# Patient Record
Sex: Female | Born: 1935
Health system: Southern US, Community
[De-identification: ages and names within clinical notes are randomized; demographics above are authoritative.]

## PROBLEM LIST (undated history)

## (undated) DIAGNOSIS — I5189 Other ill-defined heart diseases: Secondary | ICD-10-CM

## (undated) DIAGNOSIS — F329 Major depressive disorder, single episode, unspecified: Secondary | ICD-10-CM

## (undated) DIAGNOSIS — I219 Acute myocardial infarction, unspecified: Secondary | ICD-10-CM

## (undated) DIAGNOSIS — K635 Polyp of colon: Secondary | ICD-10-CM

## (undated) DIAGNOSIS — F32A Depression, unspecified: Secondary | ICD-10-CM

## (undated) DIAGNOSIS — F0390 Unspecified dementia without behavioral disturbance: Secondary | ICD-10-CM

## (undated) DIAGNOSIS — E669 Obesity, unspecified: Secondary | ICD-10-CM

## (undated) DIAGNOSIS — E785 Hyperlipidemia, unspecified: Secondary | ICD-10-CM

## (undated) DIAGNOSIS — I10 Essential (primary) hypertension: Secondary | ICD-10-CM

## (undated) DIAGNOSIS — I2510 Atherosclerotic heart disease of native coronary artery without angina pectoris: Principal | ICD-10-CM

## (undated) HISTORY — PX: ABDOMINAL HYSTERECTOMY: SHX81

## (undated) HISTORY — DX: Hyperlipidemia, unspecified: E78.5

## (undated) HISTORY — PX: VESICOVAGINAL FISTULA CLOSURE W/ TAH: SUR271

## (undated) HISTORY — DX: Obesity, unspecified: E66.9

## (undated) HISTORY — DX: Major depressive disorder, single episode, unspecified: F32.9

## (undated) HISTORY — DX: Atherosclerotic heart disease of native coronary artery without angina pectoris: I25.10

## (undated) HISTORY — DX: Depression, unspecified: F32.A

## (undated) HISTORY — PX: APPENDECTOMY: SHX54

## (undated) HISTORY — DX: Essential (primary) hypertension: I10

## (undated) HISTORY — DX: Other ill-defined heart diseases: I51.89

---

## 2001-04-23 ENCOUNTER — Ambulatory Visit (HOSPITAL_COMMUNITY): Admission: RE | Admit: 2001-04-23 | Discharge: 2001-04-23 | Payer: Self-pay | Admitting: Family Medicine

## 2001-07-30 ENCOUNTER — Encounter: Payer: Self-pay | Admitting: Family Medicine

## 2001-07-30 ENCOUNTER — Ambulatory Visit (HOSPITAL_COMMUNITY): Admission: RE | Admit: 2001-07-30 | Discharge: 2001-07-30 | Payer: Self-pay | Admitting: Family Medicine

## 2001-08-18 ENCOUNTER — Encounter: Admission: RE | Admit: 2001-08-18 | Discharge: 2001-08-18 | Payer: Self-pay | Admitting: Orthopedic Surgery

## 2001-08-18 ENCOUNTER — Encounter: Payer: Self-pay | Admitting: Orthopedic Surgery

## 2001-08-20 ENCOUNTER — Ambulatory Visit (HOSPITAL_BASED_OUTPATIENT_CLINIC_OR_DEPARTMENT_OTHER): Admission: RE | Admit: 2001-08-20 | Discharge: 2001-08-20 | Payer: Self-pay | Admitting: Orthopedic Surgery

## 2001-11-25 ENCOUNTER — Encounter: Payer: Self-pay | Admitting: Internal Medicine

## 2001-11-25 ENCOUNTER — Ambulatory Visit (HOSPITAL_COMMUNITY): Admission: RE | Admit: 2001-11-25 | Discharge: 2001-11-25 | Payer: Self-pay | Admitting: Internal Medicine

## 2002-03-02 ENCOUNTER — Ambulatory Visit (HOSPITAL_COMMUNITY): Admission: RE | Admit: 2002-03-02 | Discharge: 2002-03-02 | Payer: Self-pay | Admitting: Ophthalmology

## 2002-05-25 ENCOUNTER — Ambulatory Visit (HOSPITAL_COMMUNITY): Admission: RE | Admit: 2002-05-25 | Discharge: 2002-05-25 | Payer: Self-pay | Admitting: Ophthalmology

## 2002-06-10 ENCOUNTER — Ambulatory Visit (HOSPITAL_COMMUNITY): Admission: RE | Admit: 2002-06-10 | Discharge: 2002-06-10 | Payer: Self-pay | Admitting: Family Medicine

## 2002-06-10 ENCOUNTER — Encounter: Payer: Self-pay | Admitting: Family Medicine

## 2003-04-07 ENCOUNTER — Encounter: Payer: Self-pay | Admitting: *Deleted

## 2003-04-07 ENCOUNTER — Encounter (HOSPITAL_COMMUNITY): Admission: RE | Admit: 2003-04-07 | Discharge: 2003-05-07 | Payer: Self-pay | Admitting: *Deleted

## 2003-06-23 ENCOUNTER — Encounter: Payer: Self-pay | Admitting: Family Medicine

## 2003-06-23 ENCOUNTER — Ambulatory Visit (HOSPITAL_COMMUNITY): Admission: RE | Admit: 2003-06-23 | Discharge: 2003-06-23 | Payer: Self-pay | Admitting: Family Medicine

## 2004-06-26 ENCOUNTER — Ambulatory Visit (HOSPITAL_COMMUNITY): Admission: RE | Admit: 2004-06-26 | Discharge: 2004-06-26 | Payer: Self-pay | Admitting: Family Medicine

## 2004-08-04 DIAGNOSIS — I2510 Atherosclerotic heart disease of native coronary artery without angina pectoris: Secondary | ICD-10-CM

## 2004-08-04 DIAGNOSIS — I219 Acute myocardial infarction, unspecified: Secondary | ICD-10-CM

## 2004-08-04 HISTORY — DX: Acute myocardial infarction, unspecified: I21.9

## 2004-08-04 HISTORY — DX: Atherosclerotic heart disease of native coronary artery without angina pectoris: I25.10

## 2004-08-07 ENCOUNTER — Inpatient Hospital Stay (HOSPITAL_BASED_OUTPATIENT_CLINIC_OR_DEPARTMENT_OTHER): Admission: RE | Admit: 2004-08-07 | Discharge: 2004-08-07 | Payer: Self-pay | Admitting: *Deleted

## 2004-09-11 ENCOUNTER — Ambulatory Visit: Payer: Self-pay | Admitting: Family Medicine

## 2004-11-06 ENCOUNTER — Ambulatory Visit: Payer: Self-pay | Admitting: Family Medicine

## 2004-11-13 ENCOUNTER — Ambulatory Visit: Payer: Self-pay | Admitting: *Deleted

## 2005-03-06 ENCOUNTER — Ambulatory Visit: Payer: Self-pay | Admitting: Family Medicine

## 2005-03-21 ENCOUNTER — Ambulatory Visit: Payer: Self-pay | Admitting: *Deleted

## 2005-04-24 ENCOUNTER — Ambulatory Visit: Payer: Self-pay | Admitting: Family Medicine

## 2005-05-16 ENCOUNTER — Ambulatory Visit (HOSPITAL_COMMUNITY): Admission: RE | Admit: 2005-05-16 | Discharge: 2005-05-16 | Payer: Self-pay | Admitting: General Surgery

## 2005-09-12 ENCOUNTER — Ambulatory Visit: Payer: Self-pay | Admitting: Family Medicine

## 2005-09-17 ENCOUNTER — Ambulatory Visit (HOSPITAL_COMMUNITY): Admission: RE | Admit: 2005-09-17 | Discharge: 2005-09-17 | Payer: Self-pay | Admitting: Family Medicine

## 2005-12-16 ENCOUNTER — Ambulatory Visit: Payer: Self-pay | Admitting: Family Medicine

## 2006-04-02 ENCOUNTER — Ambulatory Visit: Payer: Self-pay | Admitting: Family Medicine

## 2006-06-23 ENCOUNTER — Ambulatory Visit: Payer: Self-pay | Admitting: Family Medicine

## 2006-08-27 ENCOUNTER — Ambulatory Visit: Payer: Self-pay | Admitting: Family Medicine

## 2006-09-18 ENCOUNTER — Ambulatory Visit (HOSPITAL_COMMUNITY): Admission: RE | Admit: 2006-09-18 | Discharge: 2006-09-18 | Payer: Self-pay | Admitting: Family Medicine

## 2006-12-19 ENCOUNTER — Ambulatory Visit: Payer: Self-pay | Admitting: Family Medicine

## 2006-12-19 LAB — CONVERTED CEMR LAB
Basophils Absolute: 0 10*3/uL (ref 0.0–0.1)
Hemoglobin: 11.6 g/dL — ABNORMAL LOW (ref 12.0–15.0)
Hgb A1c MFr Bld: 6.2 % — ABNORMAL HIGH (ref 4.6–6.1)
Lymphocytes Relative: 25 % (ref 12–46)
Microalb, Ur: 1.12 mg/dL (ref 0.00–1.89)
Monocytes Absolute: 0.7 10*3/uL (ref 0.2–0.7)
Neutro Abs: 6.2 10*3/uL (ref 1.7–7.7)
Neutrophils Relative %: 65 % (ref 43–77)
Platelets: 301 10*3/uL (ref 150–400)
RDW: 17 % — ABNORMAL HIGH (ref 11.5–14.0)

## 2006-12-24 ENCOUNTER — Ambulatory Visit (HOSPITAL_COMMUNITY): Admission: RE | Admit: 2006-12-24 | Discharge: 2006-12-24 | Payer: Self-pay | Admitting: Family Medicine

## 2007-01-07 ENCOUNTER — Ambulatory Visit (HOSPITAL_COMMUNITY): Admission: RE | Admit: 2007-01-07 | Discharge: 2007-01-07 | Payer: Self-pay | Admitting: Family Medicine

## 2007-01-21 ENCOUNTER — Encounter (INDEPENDENT_AMBULATORY_CARE_PROVIDER_SITE_OTHER): Payer: Self-pay | Admitting: *Deleted

## 2007-01-21 LAB — CONVERTED CEMR LAB

## 2007-03-19 ENCOUNTER — Ambulatory Visit: Payer: Self-pay | Admitting: Family Medicine

## 2007-03-19 LAB — CONVERTED CEMR LAB
ALT: 8 units/L (ref 0–35)
AST: 13 units/L (ref 0–37)
Calcium: 9.4 mg/dL (ref 8.4–10.5)
Cholesterol: 126 mg/dL (ref 0–200)
Hgb A1c MFr Bld: 6.4 % — ABNORMAL HIGH (ref 4.6–6.1)
Potassium: 4.3 meq/L (ref 3.5–5.3)
Sodium: 142 meq/L (ref 135–145)
Total CHOL/HDL Ratio: 2.8
Total Protein: 7.1 g/dL (ref 6.0–8.3)
Triglycerides: 70 mg/dL (ref <150)
VLDL: 14 mg/dL (ref 0–40)

## 2007-04-04 ENCOUNTER — Emergency Department (HOSPITAL_COMMUNITY): Admission: EM | Admit: 2007-04-04 | Discharge: 2007-04-04 | Payer: Self-pay | Admitting: Physician Assistant

## 2007-04-29 ENCOUNTER — Ambulatory Visit (HOSPITAL_COMMUNITY): Admission: RE | Admit: 2007-04-29 | Discharge: 2007-04-29 | Payer: Self-pay | Admitting: Family Medicine

## 2007-04-29 ENCOUNTER — Ambulatory Visit: Payer: Self-pay | Admitting: Family Medicine

## 2007-07-07 ENCOUNTER — Ambulatory Visit: Payer: Self-pay | Admitting: Family Medicine

## 2007-07-09 ENCOUNTER — Ambulatory Visit (HOSPITAL_COMMUNITY): Admission: RE | Admit: 2007-07-09 | Discharge: 2007-07-09 | Payer: Self-pay | Admitting: Family Medicine

## 2007-07-13 ENCOUNTER — Ambulatory Visit: Payer: Self-pay | Admitting: Internal Medicine

## 2007-08-04 ENCOUNTER — Ambulatory Visit: Payer: Self-pay | Admitting: Family Medicine

## 2007-10-06 ENCOUNTER — Ambulatory Visit: Payer: Self-pay | Admitting: Family Medicine

## 2007-10-12 ENCOUNTER — Encounter: Payer: Self-pay | Admitting: Family Medicine

## 2007-10-12 ENCOUNTER — Ambulatory Visit (HOSPITAL_COMMUNITY): Admission: RE | Admit: 2007-10-12 | Discharge: 2007-10-12 | Payer: Self-pay | Admitting: Family Medicine

## 2007-10-12 LAB — CONVERTED CEMR LAB
Albumin: 4.1 g/dL (ref 3.5–5.2)
BUN: 17 mg/dL (ref 6–23)
CO2: 27 meq/L (ref 19–32)
Chloride: 104 meq/L (ref 96–112)
Indirect Bilirubin: 0.4 mg/dL (ref 0.0–0.9)
LDL Cholesterol: 89 mg/dL (ref 0–99)
Potassium: 4.2 meq/L (ref 3.5–5.3)
Sodium: 143 meq/L (ref 135–145)
Total Protein: 7.2 g/dL (ref 6.0–8.3)
Triglycerides: 90 mg/dL (ref <150)
VLDL: 18 mg/dL (ref 0–40)

## 2007-11-05 ENCOUNTER — Encounter: Payer: Self-pay | Admitting: Family Medicine

## 2007-12-22 ENCOUNTER — Ambulatory Visit: Payer: Self-pay | Admitting: Family Medicine

## 2008-01-12 ENCOUNTER — Ambulatory Visit: Payer: Self-pay | Admitting: Family Medicine

## 2008-01-13 ENCOUNTER — Encounter: Payer: Self-pay | Admitting: Family Medicine

## 2008-01-26 ENCOUNTER — Encounter: Payer: Self-pay | Admitting: Family Medicine

## 2008-01-26 LAB — CONVERTED CEMR LAB
ALT: 10 units/L (ref 0–35)
AST: 14 units/L (ref 0–37)
Albumin: 4 g/dL (ref 3.5–5.2)
Alkaline Phosphatase: 60 units/L (ref 39–117)
BUN: 23 mg/dL (ref 6–23)
Basophils Absolute: 0 10*3/uL (ref 0.0–0.1)
Basophils Relative: 0 % (ref 0–1)
Bilirubin, Direct: 0.1 mg/dL (ref 0.0–0.3)
CO2: 24 meq/L (ref 19–32)
Calcium: 9.3 mg/dL (ref 8.4–10.5)
Chloride: 104 meq/L (ref 96–112)
Cholesterol: 133 mg/dL (ref 0–200)
Creatinine, Ser: 0.92 mg/dL (ref 0.40–1.20)
Eosinophils Absolute: 0.4 10*3/uL (ref 0.0–0.7)
Eosinophils Relative: 4 % (ref 0–5)
Glucose, Bld: 94 mg/dL (ref 70–99)
HCT: 36.4 % (ref 36.0–46.0)
HDL: 41 mg/dL (ref >39)
Hemoglobin: 11.5 g/dL — ABNORMAL LOW (ref 12.0–15.0)
Indirect Bilirubin: 0.3 mg/dL (ref 0.0–0.9)
LDL Cholesterol: 77 mg/dL (ref 0–99)
Lymphocytes Relative: 21 % (ref 12–46)
Lymphs Abs: 2 10*3/uL (ref 0.7–4.0)
MCHC: 31.6 g/dL (ref 30.0–36.0)
MCV: 81.6 fL (ref 78.0–100.0)
Monocytes Absolute: 0.8 10*3/uL (ref 0.1–1.0)
Monocytes Relative: 8 % (ref 3–12)
Neutro Abs: 6.3 10*3/uL (ref 1.7–7.7)
Neutrophils Relative %: 66 % (ref 43–77)
Platelets: 238 10*3/uL (ref 150–400)
Potassium: 4.4 meq/L (ref 3.5–5.3)
RBC: 4.46 M/uL (ref 3.87–5.11)
RDW: 16.9 % — ABNORMAL HIGH (ref 11.5–15.5)
Sodium: 141 meq/L (ref 135–145)
TSH: 0.876 microintl units/mL (ref 0.350–5.50)
Total Bilirubin: 0.4 mg/dL (ref 0.3–1.2)
Total CHOL/HDL Ratio: 3.2
Total Protein: 6.9 g/dL (ref 6.0–8.3)
Triglycerides: 74 mg/dL (ref <150)
VLDL: 15 mg/dL (ref 0–40)
WBC: 9.6 10*3/uL (ref 4.0–10.5)

## 2008-02-02 ENCOUNTER — Ambulatory Visit: Payer: Self-pay | Admitting: Family Medicine

## 2008-02-10 ENCOUNTER — Encounter (INDEPENDENT_AMBULATORY_CARE_PROVIDER_SITE_OTHER): Payer: Self-pay | Admitting: *Deleted

## 2008-02-10 DIAGNOSIS — R7303 Prediabetes: Secondary | ICD-10-CM

## 2008-02-10 DIAGNOSIS — I10 Essential (primary) hypertension: Secondary | ICD-10-CM

## 2008-02-10 DIAGNOSIS — F329 Major depressive disorder, single episode, unspecified: Secondary | ICD-10-CM | POA: Insufficient documentation

## 2008-02-10 DIAGNOSIS — E785 Hyperlipidemia, unspecified: Secondary | ICD-10-CM

## 2008-02-10 DIAGNOSIS — E669 Obesity, unspecified: Secondary | ICD-10-CM

## 2008-04-20 ENCOUNTER — Ambulatory Visit: Payer: Self-pay | Admitting: Family Medicine

## 2008-05-17 ENCOUNTER — Ambulatory Visit: Payer: Self-pay | Admitting: Family Medicine

## 2008-06-29 ENCOUNTER — Encounter: Payer: Self-pay | Admitting: Family Medicine

## 2008-06-30 ENCOUNTER — Encounter: Payer: Self-pay | Admitting: Family Medicine

## 2008-08-02 ENCOUNTER — Ambulatory Visit: Payer: Self-pay | Admitting: Family Medicine

## 2008-08-02 LAB — CONVERTED CEMR LAB
ALT: 10 units/L (ref 0–35)
AST: 16 units/L (ref 0–37)
BUN: 22 mg/dL (ref 6–23)
Calcium: 9.8 mg/dL (ref 8.4–10.5)
Cholesterol: 151 mg/dL (ref 0–200)
Hgb A1c MFr Bld: 5.7 %
Indirect Bilirubin: 0.4 mg/dL (ref 0.0–0.9)
Potassium: 4.4 meq/L (ref 3.5–5.3)
Sodium: 141 meq/L (ref 135–145)
Total Protein: 7.5 g/dL (ref 6.0–8.3)
Triglycerides: 75 mg/dL (ref <150)
VLDL: 15 mg/dL (ref 0–40)

## 2008-09-22 ENCOUNTER — Encounter: Payer: Self-pay | Admitting: Family Medicine

## 2008-09-27 ENCOUNTER — Encounter: Payer: Self-pay | Admitting: Family Medicine

## 2008-09-28 ENCOUNTER — Encounter: Payer: Self-pay | Admitting: Family Medicine

## 2008-10-17 ENCOUNTER — Encounter: Payer: Self-pay | Admitting: Family Medicine

## 2008-10-31 ENCOUNTER — Encounter: Payer: Self-pay | Admitting: Family Medicine

## 2008-11-15 ENCOUNTER — Ambulatory Visit: Payer: Self-pay | Admitting: Family Medicine

## 2008-11-15 LAB — CONVERTED CEMR LAB
AST: 14 units/L (ref 0–37)
Alkaline Phosphatase: 60 units/L (ref 39–117)
BUN: 24 mg/dL — ABNORMAL HIGH (ref 6–23)
Bilirubin, Direct: 0.1 mg/dL (ref 0.0–0.3)
CO2: 25 meq/L (ref 19–32)
Calcium: 9.3 mg/dL (ref 8.4–10.5)
Creatinine, Ser: 0.8 mg/dL (ref 0.40–1.20)
Glucose, Bld: 121 mg/dL
Glucose, Bld: 94 mg/dL (ref 70–99)
HDL: 45 mg/dL (ref >39)
Indirect Bilirubin: 0.4 mg/dL (ref 0.0–0.9)
LDL Cholesterol: 88 mg/dL (ref 0–99)
Total Bilirubin: 0.5 mg/dL (ref 0.3–1.2)

## 2008-11-23 ENCOUNTER — Ambulatory Visit (HOSPITAL_COMMUNITY): Admission: RE | Admit: 2008-11-23 | Discharge: 2008-11-23 | Payer: Self-pay | Admitting: Family Medicine

## 2008-12-05 ENCOUNTER — Encounter: Payer: Self-pay | Admitting: Family Medicine

## 2009-02-23 ENCOUNTER — Ambulatory Visit: Payer: Self-pay | Admitting: Family Medicine

## 2009-05-02 ENCOUNTER — Encounter: Payer: Self-pay | Admitting: Family Medicine

## 2009-05-22 ENCOUNTER — Encounter: Payer: Self-pay | Admitting: Family Medicine

## 2009-05-29 ENCOUNTER — Encounter: Payer: Self-pay | Admitting: Family Medicine

## 2009-05-29 LAB — CONVERTED CEMR LAB
Basophils Relative: 0 % (ref 0–1)
CO2: 25 meq/L (ref 19–32)
Calcium: 9.2 mg/dL (ref 8.4–10.5)
Chloride: 104 meq/L (ref 96–112)
Creatinine, Ser: 0.82 mg/dL (ref 0.40–1.20)
Eosinophils Absolute: 0.4 10*3/uL (ref 0.0–0.7)
Glucose, Bld: 81 mg/dL (ref 70–99)
HCT: 37.8 % (ref 36.0–46.0)
HDL: 45 mg/dL (ref >39)
Hemoglobin: 12 g/dL (ref 12.0–15.0)
LDL Cholesterol: 89 mg/dL (ref 0–99)
Lymphs Abs: 1.9 10*3/uL (ref 0.7–4.0)
MCHC: 31.7 g/dL (ref 30.0–36.0)
MCV: 82.4 fL (ref 78.0–100.0)
Monocytes Absolute: 0.8 10*3/uL (ref 0.1–1.0)
Monocytes Relative: 9 % (ref 3–12)
RBC: 4.59 M/uL (ref 3.87–5.11)
Total CHOL/HDL Ratio: 3.3
WBC: 8.4 10*3/uL (ref 4.0–10.5)

## 2009-06-01 ENCOUNTER — Ambulatory Visit: Payer: Self-pay | Admitting: Family Medicine

## 2009-06-01 LAB — CONVERTED CEMR LAB
Glucose, Bld: 95 mg/dL
Hgb A1c MFr Bld: 5.9 %

## 2009-06-02 ENCOUNTER — Encounter: Payer: Self-pay | Admitting: Family Medicine

## 2009-06-02 LAB — CONVERTED CEMR LAB
Creatinine, Urine: 193.8 mg/dL
Microalb Creat Ratio: 13.2 mg/g (ref 0.0–30.0)

## 2009-06-12 ENCOUNTER — Telehealth: Payer: Self-pay | Admitting: Family Medicine

## 2009-06-13 ENCOUNTER — Ambulatory Visit (HOSPITAL_COMMUNITY): Admission: RE | Admit: 2009-06-13 | Discharge: 2009-06-13 | Payer: Self-pay | Admitting: Family Medicine

## 2009-09-06 ENCOUNTER — Ambulatory Visit: Payer: Self-pay | Admitting: Family Medicine

## 2009-09-07 LAB — CONVERTED CEMR LAB
BUN: 22 mg/dL (ref 6–23)
Bilirubin, Direct: 0.1 mg/dL (ref 0.0–0.3)
CO2: 28 meq/L (ref 19–32)
Cholesterol: 191 mg/dL (ref 0–200)
Glucose, Bld: 103 mg/dL — ABNORMAL HIGH (ref 70–99)
Indirect Bilirubin: 0.3 mg/dL (ref 0.0–0.9)
LDL Cholesterol: 124 mg/dL — ABNORMAL HIGH (ref 0–99)
Potassium: 4.6 meq/L (ref 3.5–5.3)
Sodium: 141 meq/L (ref 135–145)
TSH: 1.141 microintl units/mL (ref 0.350–4.500)
Total Bilirubin: 0.4 mg/dL (ref 0.3–1.2)
Total CHOL/HDL Ratio: 4.1
VLDL: 20 mg/dL (ref 0–40)

## 2010-01-05 ENCOUNTER — Encounter: Payer: Self-pay | Admitting: Family Medicine

## 2010-01-09 ENCOUNTER — Ambulatory Visit: Payer: Self-pay | Admitting: Family Medicine

## 2010-01-09 LAB — CONVERTED CEMR LAB: Blood Glucose, Fasting: 126 mg/dL

## 2010-01-10 LAB — CONVERTED CEMR LAB
Albumin: 3.8 g/dL (ref 3.5–5.2)
Alkaline Phosphatase: 66 units/L (ref 39–117)
CO2: 26 meq/L (ref 19–32)
Chloride: 105 meq/L (ref 96–112)
Creatinine, Ser: 0.83 mg/dL (ref 0.40–1.20)
HDL: 47 mg/dL (ref >39)
Hgb A1c MFr Bld: 6.1 % (ref 4.6–6.1)
LDL Cholesterol: 114 mg/dL — ABNORMAL HIGH (ref 0–99)
Potassium: 4.3 meq/L (ref 3.5–5.3)
Sodium: 142 meq/L (ref 135–145)
Total Bilirubin: 0.3 mg/dL (ref 0.3–1.2)
Total CHOL/HDL Ratio: 3.7
Total Protein: 6.7 g/dL (ref 6.0–8.3)
Triglycerides: 73 mg/dL (ref <150)
VLDL: 15 mg/dL (ref 0–40)

## 2010-01-14 DIAGNOSIS — B369 Superficial mycosis, unspecified: Secondary | ICD-10-CM | POA: Insufficient documentation

## 2010-02-16 ENCOUNTER — Encounter: Payer: Self-pay | Admitting: Family Medicine

## 2010-04-25 ENCOUNTER — Ambulatory Visit: Payer: Self-pay | Admitting: Family Medicine

## 2010-04-25 DIAGNOSIS — R5381 Other malaise: Secondary | ICD-10-CM | POA: Insufficient documentation

## 2010-04-25 DIAGNOSIS — E559 Vitamin D deficiency, unspecified: Secondary | ICD-10-CM

## 2010-04-25 DIAGNOSIS — R5383 Other fatigue: Secondary | ICD-10-CM

## 2010-05-03 ENCOUNTER — Ambulatory Visit (HOSPITAL_COMMUNITY): Admission: RE | Admit: 2010-05-03 | Discharge: 2010-05-03 | Payer: Self-pay | Admitting: Family Medicine

## 2010-08-07 ENCOUNTER — Ambulatory Visit: Payer: Self-pay | Admitting: Family Medicine

## 2010-08-08 ENCOUNTER — Encounter: Payer: Self-pay | Admitting: Family Medicine

## 2010-08-08 LAB — CONVERTED CEMR LAB: Creatinine, Urine: 168.4 mg/dL

## 2010-08-10 DIAGNOSIS — F0390 Unspecified dementia without behavioral disturbance: Secondary | ICD-10-CM | POA: Insufficient documentation

## 2010-09-19 ENCOUNTER — Telehealth (INDEPENDENT_AMBULATORY_CARE_PROVIDER_SITE_OTHER): Payer: Self-pay | Admitting: *Deleted

## 2010-09-19 ENCOUNTER — Ambulatory Visit: Payer: Self-pay | Admitting: Family Medicine

## 2010-09-19 DIAGNOSIS — G5600 Carpal tunnel syndrome, unspecified upper limb: Secondary | ICD-10-CM

## 2010-09-20 ENCOUNTER — Encounter: Payer: Self-pay | Admitting: Family Medicine

## 2010-09-20 LAB — CONVERTED CEMR LAB
AST: 13 units/L (ref 0–37)
Alkaline Phosphatase: 75 units/L (ref 39–117)
BUN: 18 mg/dL (ref 6–23)
Calcium: 9.4 mg/dL (ref 8.4–10.5)
Chloride: 100 meq/L (ref 96–112)
Creatinine, Ser: 0.82 mg/dL (ref 0.40–1.20)
Creatinine, Urine: 133.4 mg/dL
Indirect Bilirubin: 0.4 mg/dL (ref 0.0–0.9)
LDL Cholesterol: 133 mg/dL — ABNORMAL HIGH (ref 0–99)
Microalb Creat Ratio: 10.3 mg/g (ref 0.0–30.0)
Total Bilirubin: 0.5 mg/dL (ref 0.3–1.2)
Total Protein: 7 g/dL (ref 6.0–8.3)
Triglycerides: 95 mg/dL (ref <150)

## 2010-11-25 ENCOUNTER — Encounter: Payer: Self-pay | Admitting: Family Medicine

## 2010-12-04 NOTE — Letter (Signed)
Summary: MEDICAL RELEASE  MEDICAL RELEASE   Imported By: Lind Guest 01/12/2010 13:49:55  _____________________________________________________________________  External Attachment:    Type:   Image     Comment:   External Document

## 2010-12-04 NOTE — Letter (Signed)
Summary: OFFICE NOTES  OFFICE NOTES   Imported By: Lind Guest 04/17/2010 08:49:14  _____________________________________________________________________  External Attachment:    Type:   Image     Comment:   External Document

## 2010-12-04 NOTE — Letter (Signed)
Summary: medical release  medical release   Imported By: Lind Guest 02/16/2010 13:14:29  _____________________________________________________________________  External Attachment:    Type:   Image     Comment:   External Document

## 2010-12-04 NOTE — Assessment & Plan Note (Signed)
Summary: OV   Vital Signs:  Patient profile:   77 year old female Menstrual status:  hysterectomy Height:      61 inches Weight:      212.25 pounds BMI:     40.25 O2 Sat:      95 % on Room air Pulse rate:   95 / minute Pulse rhythm:   regular Resp:     16 per minute BP sitting:   130 / 80  (left arm)  Vitals Entered By: Mauricia Area CMA (September 19, 2010 1:48 PM)  Nutrition Counseling: Patient's BMI is greater than 25 and therefore counseled on weight management options.  O2 Flow:  Room air CC: Both hands numb for past month Comments Did not bring meds   Primary Care Provider:  dr Lodema Hong  CC:  Both hands numb for past month.  History of Present Illness: Reports  that she has been doing fairly well She is concerned about bilateral weakness in the hands and numbness. Denies recent fever or chills. Denies sinus pressure, nasal congestion , ear pain or sore throat. Denies chest congestion, or cough productive of sputum. Denies chest pain, palpitations, PND, orthopnea or leg swelling. Denies abdominal pain, nausea, vomitting, diarrhea or constipation. Denies change in bowel movements or bloody stool. Denies dysuria , frequency, incontinence or hesitancy. Denies  joint pain, swelling, or reduced mobility. Denies headaches, vertigo, seizures.  Denies  rash, lesions, or itch.     Allergies (verified): No Known Drug Allergies  Review of Systems      See HPI General:  Complains of fatigue. Eyes:  Complains of vision loss-both eyes; denies eye pain and red eye. MS:  Complains of joint pain, low back pain, mid back pain, muscle aches, muscle weakness, and stiffness. Neuro:  Complains of memory loss. Psych:  Complains of anxiety and depression; denies mental problems, suicidal thoughts/plans, thoughts of violence, and unusual visions or sounds. Endo:  Denies cold intolerance, excessive hunger, excessive thirst, excessive urination, and heat intolerance. Heme:  Denies  abnormal bruising and bleeding. Allergy:  Complains of seasonal allergies.  Physical Exam  General:  Well-developed,obese,in no acute distress; alert,appropriate and cooperative throughout examination HEENT: No facial asymmetry,  EOMI, No sinus tenderness, TM's Clear, oropharynx  pink and moist.   Chest: Clear to auscultation bilaterally.  CVS: S1, S2, No murmurs, No S3.   Abd: Soft, Nontender.  ZO:[id number]  ROM spine, hips, shoulders and knees. positive tinnels with bilateal thenar wasting Ext: No edema.   CNS: CN 2-12 intact, reduced power in hands  Skin: Intact, no visible lesions or rashes.  Psych: Good eye contact, normal affect.  Memory loss not anxious or depressed appearing.   Diabetes Management Exam:    Foot Exam (with socks and/or shoes not present):       Sensory-Monofilament:          Left foot: diminished          Right foot: diminished       Inspection:          Left foot: abnormal             Comments: callous          Right foot: abnormal             Comments: callous       Nails:          Left foot: thickened          Right foot: thickened   Impression &  Recommendations:  Problem # 1:  CARPAL TUNNEL SYNDROME, BILATERAL (ICD-354.0) Assessment Deteriorated  Orders: Depo- Medrol 80mg  (J1040) Ketorolac-Toradol 15mg  (D3220) Admin of Therapeutic Inj  intramuscular or subcutaneous (25427) Medicare Electronic Prescription (C6237)  Problem # 2:  DIABETES MELLITUS (ICD-250.00) Assessment: Comment Only  Her updated medication list for this problem includes:    Diovan Hct 320-25 Mg Tabs (Valsartan-hydrochlorothiazide) ..... One tab by mouth once daily  Labs Reviewed: Creat: 0.83 (01/09/2010)    Reviewed HgBA1c results: 6.1 (01/09/2010)  5.8 (09/06/2009)  Problem # 3:  HYPERLIPIDEMIA (ICD-272.4) Assessment: Comment Only  Labs Reviewed: SGOT: 12 (01/09/2010)   SGPT: <8 U/L (01/09/2010)   HDL:47 (01/09/2010), 47 (09/06/2009)  LDL:114 (01/09/2010), 124  (09/06/2009)  Chol:176 (01/09/2010), 191 (09/06/2009)  Trig:73 (01/09/2010), 98 (09/06/2009) Low fat dietdiscussed and encouraged  Problem # 4:  HYPERTENSION (ICD-401.9) Assessment: Unchanged  Her updated medication list for this problem includes:    Diovan Hct 320-25 Mg Tabs (Valsartan-hydrochlorothiazide) ..... One tab by mouth once daily    Metoprolol Tartrate 50 Mg Tabs (Metoprolol tartrate) ..... One half tab by mouth once daily  BP today: 130/80 Prior BP: 140/70 (08/07/2010)  Labs Reviewed: K+: 4.3 (01/09/2010) Creat: : 0.83 (01/09/2010)   Chol: 176 (01/09/2010)   HDL: 47 (01/09/2010)   LDL: 114 (01/09/2010)   TG: 73 (01/09/2010)  Complete Medication List: 1)  Xanax 0.5 Mg Tabs (Alprazolam) .... One tab by mouth once daily 2)  Diovan Hct 320-25 Mg Tabs (Valsartan-hydrochlorothiazide) .... One tab by mouth once daily 3)  Vitamin B-6 100 Mg Tabs (Pyridoxine hcl) .... One tab by mouth once daily 4)  Isosorbide Mononitrate Cr 30 Mg Xr24h-tab (Isosorbide mononitrate) .... Take 1 tablet by mouth once a day 5)  Metoprolol Tartrate 50 Mg Tabs (Metoprolol tartrate) .... One half tab by mouth once daily 6)  Fluoxetine Hcl 20 Mg Caps (Fluoxetine hcl) .... One cap by mouth qd 7)  Exelon 9.5 Mg/24hr Pt24 (Rivastigmine) .... Apply one patch daily 8)  Celebrex 200 Mg Caps (Celecoxib) .... Take 1 tablet by mouth once a day  Other Orders: T-Urine Microalbumin w/creat. ratio [id number])  Patient Instructions: 1)  Please schedule a follow-up appointment in 4 months. 2)  You need to lose weight. Consider a lower calorie diet and regular exercise.  3)  Microalb today. 4)  Labs today. 5)  Injections today for numb hands. 6)  Med is being sent in for your hands also Prescriptions: CELEBREX 200 MG CAPS (CELECOXIB) Take 1 tablet by mouth once a day  #10 x 0   Entered and Authorized by:   Syliva Overman MD   Signed by:   Syliva Overman MD on 09/19/2010   Method used:   Electronically  to        [facility]* (retail)       [address]       [place], [place]  [address]       Ph: [phone]       Fax: [phone]   RxID:   [id number] PREDNISONE (PAK) 5 MG TABS (PREDNISONE) Use as directed  #21 x 0   Entered and Authorized by:   Syliva Overman MD   Signed by:   Syliva Overman MD on 09/19/2010   Method used:   Electronically to        [facility] (retail)       [address]  [place], [place]  [address]       Ph: [phone]       Fax: [phone]   RxID:   [id number]    Medication Administration  Injection # 1:    Medication: Depo- Medrol 80mg     Diagnosis: CARPAL TUNNEL SYNDROME, BILATERAL (ICD-354.0)    Route: IM    Site: RUOQ gluteus    Exp Date: 07/12    Lot #: [id number]    Mfr: Pharmacia    Patient tolerated injection without complications    Given by: Adella Hare LPN (September 19, 2010 2:46 PM)  Injection # 2:    Medication: Ketorolac-Toradol 15mg     Diagnosis: CARPAL TUNNEL SYNDROME, BILATERAL (ICD-354.0)    Route: IM    Site: LUOQ gluteus    Exp Date: 09/05/2011    Lot #: [id number]    Mfr: [email or web address]    Comments: toradol 60mg  given    Patient tolerated injection without complications    Given by: Adella Hare LPN (September 19, 2010 2:47 PM)  Orders Added: 1)  Est. Patient Level IV [01027] 2)  T-Urine Microalbumin w/creat. ratio [[id number]] 3)  Depo- Medrol 80mg  [J1040] 4)  Ketorolac-Toradol 15mg  [J1885] 5)  Admin of Therapeutic Inj  intramuscular or subcutaneous [96372] 6)  Medicare Electronic Prescription [G8553]     Medication Administration  Injection # 1:    Medication: Depo- Medrol 80mg     Diagnosis: CARPAL TUNNEL SYNDROME, BILATERAL (ICD-354.0)    Route: IM    Site: RUOQ gluteus    Exp Date: 07/12    Lot #: [id number]    Mfr: Pharmacia    Patient tolerated injection without complications    Given by: Adella Hare LPN (September 19, 2010 2:46 PM)  Injection # 2:    Medication: Ketorolac-Toradol 15mg     Diagnosis: CARPAL TUNNEL SYNDROME, BILATERAL (ICD-354.0)    Route: IM    Site: LUOQ gluteus    Exp Date: 09/05/2011    Lot #: [id number]    Mfr: [email or web address]    Comments: toradol 60mg  given    Patient tolerated injection without complications    Given by: Adella Hare LPN (September 19, 2010 2:47 PM)  Orders Added: 1)  Est. Patient Level IV [03474] 2)  T-Urine Microalbumin w/creat. ratio [[id number]] 3)  Depo- Medrol 80mg  [J1040] 4)  Ketorolac-Toradol 15mg  [J1885] 5)  Admin of Therapeutic Inj  intramuscular or subcutaneous [96372] 6)  Medicare Electronic Prescription [id number]

## 2010-12-04 NOTE — Letter (Signed)
Summary: LABS  LABS   Imported By: Lind Guest 04/17/2010 08:47:54  _____________________________________________________________________  External Attachment:    Type:   Image     Comment:   External Document

## 2010-12-04 NOTE — Letter (Signed)
Summary: PHONE NOTES  PHONE NOTES   Imported By: Lind Guest 04/17/2010 08:49:56  _____________________________________________________________________  External Attachment:    Type:   Image     Comment:   External Document

## 2010-12-04 NOTE — Letter (Signed)
Summary: MISC  MISC   Imported By: Lind Guest 04/17/2010 08:48:35  _____________________________________________________________________  External Attachment:    Type:   Image     Comment:   External Document

## 2010-12-04 NOTE — Letter (Signed)
Summary: HISTORY AND PHYSICAL  HISTORY AND PHYSICAL   Imported By: Lind Guest 04/17/2010 08:47:22  _____________________________________________________________________  External Attachment:    Type:   Image     Comment:   External Document

## 2010-12-04 NOTE — Assessment & Plan Note (Signed)
Summary: follow up   Vital Signs:  Patient profile:   77 year old female Menstrual status:  hysterectomy Height:      61 inches Weight:      209.75 pounds BMI:     39.78 O2 Sat:      94 % on Room air Pulse rate:   71 / minute Pulse rhythm:   regular Resp:     16 per minute BP sitting:   140 / 70  (left arm)  Vitals Entered By: Adella Hare LPN (August 07, 2010 11:31 AM)  Nutrition Counseling: Patient's BMI is greater than 25 and therefore counseled on weight management options.  O2 Flow:  Room air CC: follow-up visit Is Patient Diabetic? Yes Pain Assessment Patient in pain? no        Primary Care Kyreese Chio:  dr Lodema Hong  CC:  follow-up visit.  History of Present Illness: Reports  that tshe has been doing fairly well. Denies recent fever or chills. Denies sinus pressure, nasal congestion , ear pain or sore throat. Denies chest congestion, or cough productive of sputum. Denies chest pain, palpitations, PND, orthopnea or leg swelling. Denies abdominal pain, nausea, vomitting, diarrhea or constipation. Denies change in bowel movements or bloody stool. Denies dysuria , frequency, incontinence or hesitancy. she does have chronic back pain with reduced mobility. Denies headaches, vertigo, seizures. Denies depression, anxiety or insomnia.she is on medication which controls her symptoms. Denies  rash, lesions, or itch.     Current Medications (verified): 1)  Xanax 0.5 Mg  Tabs (Alprazolam) .... One Tab By Mouth Once Daily 2)  Diovan Hct 320-25 Mg  Tabs (Valsartan-Hydrochlorothiazide) .... One Tab By Mouth Once Daily 3)  Vitamin B-6 100 Mg  Tabs (Pyridoxine Hcl) .... One Tab By Mouth Once Daily 4)  Isosorbide Mononitrate Cr 30 Mg Xr24h-Tab (Isosorbide Mononitrate) .... Take 1 Tablet By Mouth Once A Day 5)  Metoprolol Tartrate 50 Mg  Tabs (Metoprolol Tartrate) .... One Half Tab By Mouth Once Daily 6)  Fluoxetine Hcl 20 Mg Caps (Fluoxetine Hcl) .... One Cap By Mouth Qd 7)   Ketoconazole 2 % Crea (Ketoconazole) .... Apply Once Daily To Affected Areas Unde Both Breasts For 3 Weeks , Then As Needed 8)  Fluconazole 150 Mg Tabs (Fluconazole) .... Take 1 Tablet By Mouth Once A Day 9)  Glipizide 2.5 Mg Xr24h-Tab (Glipizide) .... Take 1 Tablet By Mouth Once A Day 10)  Crestor 20 Mg Tabs (Rosuvastatin Calcium) .... Take 1 Tab By Mouth At Bedtime 11)  Exelon 4.6 Mg/24hr Pt24 (Rivastigmine) .... Apply One Patch Every Day To Upper Chest or Back or Uopper Arms  Allergies (verified): No Known Drug Allergies  Review of Systems      See HPI General:  Complains of fatigue. Eyes:  Complains of vision loss-both eyes. Endo:  Denies excessive hunger and excessive thirst. Heme:  Denies abnormal bruising and bleeding. Allergy:  Complains of seasonal allergies.  Physical Exam  General:  Well-developed,obese,in no acute distress; alert,appropriate and cooperative throughout examination HEENT: No facial asymmetry,  EOMI, No sinus tenderness, TM's Clear, oropharynx  pink and moist.   Chest: Clear to auscultation bilaterally. decreased air entry throughout. CVS: S1, S2, No murmurs, No S3.   Abd: Soft, Nontender.  MS: decreased  ROM spine, hips, shoulders and knees.  Ext: No edema.   CNS: CN 2-12 intact, power tone and sensation normal throughout.   Skin: Intact, no visible lesions or rashes.  Psych: Good eye contact, normal affect.  Memory intact, not anxious or depressed appearing.    Impression & Recommendations:  Problem # 1:  DIABETES MELLITUS (ICD-250.00) Assessment Comment Only  The following medications were removed from the medication list:    Glipizide 2.5 Mg Xr24h-tab (Glipizide) .Marland Kitchen... Take 1 tablet by mouth once a day Her updated medication list for this problem includes:    Diovan Hct 320-25 Mg Tabs (Valsartan-hydrochlorothiazide) ..... One tab by mouth once daily  Orders: T- Hemoglobin A1C ([id number]), past due T-Urine Microalbumin w/creat. ratio  [id number])  Labs Reviewed: Creat: 0.83 (01/09/2010)    Reviewed HgBA1c results: 6.1 (01/09/2010)  5.8 (09/06/2009) Patient advised to reduce carbs and sweets, commit to regular physical activity, take meds as prescribed, test blood sugars as directed, and attempt to lose weight , to improve blood sugar control.  Problem # 2:  DEPRESSION (ICD-311) Assessment: Improved  Her updated medication list for this problem includes:    Xanax 0.5 Mg Tabs (Alprazolam) ..... One tab by mouth once daily    Fluoxetine Hcl 20 Mg Caps (Fluoxetine hcl) ..... One cap by mouth qd  Problem # 3:  OBESITY (ICD-278.00) Assessment: Unchanged  Ht: 61 (08/07/2010)   Wt: 209.75 (08/07/2010)   BMI: 39.78 (08/07/2010) therapeutic lifestyle change discussed and encouraged  Problem # 4:  HYPERLIPIDEMIA (ICD-272.4) Assessment: Comment Only  The following medications were removed from the medication list:    Crestor 20 Mg Tabs (Rosuvastatin calcium) .Marland Kitchen... Take 1 tab by mouth at bedtime  Orders: T-Hepatic Function [id number]) T-Lipid Profile [phone])  Labs Reviewed: SGOT: 12 (01/09/2010)   SGPT: <8 U/L (01/09/2010)   HDL:47 (01/09/2010), 47 (09/06/2009)  LDL:114 (01/09/2010), 124 (09/06/2009)  Chol:176 (01/09/2010), 191 (09/06/2009)  Trig:73 (01/09/2010), 98 (09/06/2009) Low fat dietdiscussed and encouraged  Problem # 5:  HYPERTENSION (ICD-401.9) Assessment: Deteriorated  Her updated medication list for this problem includes:    Diovan Hct 320-25 Mg Tabs (Valsartan-hydrochlorothiazide) ..... One tab by mouth once daily    Metoprolol Tartrate 50 Mg Tabs (Metoprolol tartrate) ..... One half tab by mouth once daily  Orders: T-Basic Metabolic Panel [id number])  BP today: 140/70 Prior BP: 110/66 (04/25/2010)  Labs Reviewed: K+: 4.3 (01/09/2010) Creat: : 0.83 (01/09/2010)   Chol: 176 (01/09/2010)   HDL: 47 (01/09/2010)   LDL: 114 (01/09/2010)   TG: 73 (01/09/2010)  Problem # 6:  DEMENTIA  (ICD-294.8) Assessment: Improved continue exelon patch as before  Complete Medication List: 1)  Xanax 0.5 Mg Tabs (Alprazolam) .... One tab by mouth once daily 2)  Diovan Hct 320-25 Mg Tabs (Valsartan-hydrochlorothiazide) .... One tab by mouth once daily 3)  Vitamin B-6 100 Mg Tabs (Pyridoxine hcl) .... One tab by mouth once daily 4)  Isosorbide Mononitrate Cr 30 Mg Xr24h-tab (Isosorbide mononitrate) .... Take 1 tablet by mouth once a day 5)  Metoprolol Tartrate 50 Mg Tabs (Metoprolol tartrate) .... One half tab by mouth once daily 6)  Fluoxetine Hcl 20 Mg Caps (Fluoxetine hcl) .... One cap by mouth qd 7)  Exelon 9.5 Mg/24hr Pt24 (Rivastigmine) .... Apply one patch daily  Other Orders: Influenza Vaccine NON MCR ([id number])  Patient Instructions: 1)  Please schedule a follow-up appointment in 3 months. 2)  BMP prior to visit, ICD-9: 3)  Hepatic Panel prior to visit, ICD-9:n  fasting today 4)  Lipid Panel prior to visit, ICD-9: 5)  HbgA1C prior to visit, ICD-9: 6)  Send microalb today 7)  Flu vac today Prescriptions: EXELON 9.5 MG/24HR PT24 (RIVASTIGMINE) apply one patch daily  #30 x 5  Entered and Authorized by:   Syliva Overman MD   Signed by:   Syliva Overman MD on 08/07/2010   Method used:   Printed then faxed to ...       [facility]* (retail)       [address]       [place], [place]  [address]       Ph: [phone]       Fax: [phone]   RxID:   [id number]    Immunizations Administered:  Influenza Vaccine # 1:    Vaccine Type: Fluvax Non-MCR    Site: right deltoid    Mfr: novartis    Dose: 0.5 ml    Route: IM    Given by: Adella Hare LPN    Exp. Date: 03/2011    Lot #: [id number]    VIS given: 05/29/10 version given August 07, 2010.

## 2010-12-04 NOTE — Assessment & Plan Note (Signed)
Summary: office visit   Vital Signs:  Patient profile:   77 year old female Menstrual status:  hysterectomy Height:      61 inches Weight:      211.75 pounds BMI:     40.15 O2 Sat:      94 % Pulse rate:   73 / minute Pulse rhythm:   regular Resp:     16 per minute BP sitting:   110 / 66  (left arm) Cuff size:   large  Vitals Entered By: Everitt Amber LPN (April 25, 2010 1:09 PM)  Nutrition Counseling: Patient's BMI is greater than 25 and therefore counseled on weight management options. CC: Follow up chronic problems, head has been swimmy today   Primary Care Provider:  dr Lodema Hong  CC:  Follow up chronic problems and head has been swimmy today.  History of Present Illness: Reports  thatshe has been  doing well. Denies recent fever or chills. Denies sinus pressure, nasal congestion , ear pain or sore throat. Denies chest congestion, or cough productive of sputum. Denies chest pain, palpitations, PND, orthopnea or leg swelling. Denies abdominal pain, nausea, vomitting, diarrhea or constipation. Denies change in bowel movements or bloody stool. Denies dysuria , frequency, incontinence or hesitancy. Denies  joint pain, swelling, or reduced mobility. Denies headaches, vertigo, seizures. Denies depression, anxiety or insomnia.Well controlled on meds Denies  rash, lesions, or itch.     Allergies (verified): No Known Drug Allergies  Review of Systems      See HPI Eyes:  Denies blurring and red eye. Endo:  Denies cold intolerance, excessive hunger, excessive thirst, excessive urination, heat intolerance, polyuria, and weight change; tests 2 to 3 times per week. Heme:  Denies abnormal bruising and bleeding. Allergy:  Denies hives or rash and itching eyes.  Physical Exam  General:  Well-developed,overweight,in no acute distress; alert,appropriate and cooperative throughout examination HEENT: No facial asymmetry,  EOMI, No sinus tenderness, TM's Clear, oropharynx  pink and  moist.   Chest: Clear to auscultation bilaterally.  CVS: S1, S2, No murmurs, No S3.   Abd: Soft, Nontender.  MS: decreased ROM spine, hips, shoulders and knees.  Ext: No edema.   CNS: CN 2-12 intact, power tone and sensation normal throughout.   Skin: no rash or ulcerations noted  Psych: Good eye contact, normal affect.  Memory intact, not anxious or depressed appearing.    Impression & Recommendations:  Problem # 1:  DIABETES MELLITUS (ICD-250.00) Assessment Comment Only  Her updated medication list for this problem includes:    Diovan Hct 320-25 Mg Tabs (Valsartan-hydrochlorothiazide) ..... One tab by mouth once daily    Glipizide 2.5 Mg Xr24h-tab (Glipizide) .Marland Kitchen... Take 1 tablet by mouth once a day  Orders: T- Hemoglobin A1C ([id number])  Labs Reviewed: Creat: 0.83 (01/09/2010)    Reviewed HgBA1c results: 6.1 (01/09/2010)  5.8 (09/06/2009)  Problem # 2:  DEPRESSION (ICD-311) Assessment: Improved  Her updated medication list for this problem includes:    Xanax 0.5 Mg Tabs (Alprazolam) ..... One tab by mouth once daily    Fluoxetine Hcl 20 Mg Caps (Fluoxetine hcl) ..... One cap by mouth qd  Problem # 3:  HYPERTENSION (ICD-401.9) Assessment: Improved  Her updated medication list for this problem includes:    Diovan Hct 320-25 Mg Tabs (Valsartan-hydrochlorothiazide) ..... One tab by mouth once daily    Metoprolol Tartrate 50 Mg Tabs (Metoprolol tartrate) ..... One half tab by mouth once daily  Orders: T-Basic Metabolic Panel [id number])  BP today:  110/66 Prior BP: 134/70 (01/09/2010)  Labs Reviewed: K+: 4.3 (01/09/2010) Creat: : 0.83 (01/09/2010)   Chol: 176 (01/09/2010)   HDL: 47 (01/09/2010)   LDL: 114 (01/09/2010)   TG: 73 (01/09/2010)  Problem # 4:  DERMATOMYCOSIS (ICD-111.9) Assessment: Improved  Her updated medication list for this problem includes:    Ketoconazole 2 % Crea (Ketoconazole) .Marland Kitchen... Apply once daily to affected areas unde both breasts for 3  weeks , then as needed    Fluconazole 150 Mg Tabs (Fluconazole) .Marland Kitchen... Take 1 tablet by mouth once a day  Problem # 5:  OBESITY (ICD-278.00) Assessment: Unchanged  Ht: 61 (04/25/2010)   Wt: 211.75 (04/25/2010)   BMI: 40.15 (04/25/2010)  Problem # 6:  HYPERLIPIDEMIA (ICD-272.4) Assessment: Unchanged  The following medications were removed from the medication list:    Simvastatin 40 Mg Tabs (Simvastatin) .Marland Kitchen... Take one tab by mouth at bedtime Her updated medication list for this problem includes:    Crestor 20 Mg Tabs (Rosuvastatin calcium) .Marland Kitchen... Take 1 tab by mouth at bedtime  Orders: T-Hepatic Function [id number]) T-Lipid Profile [id number] [phone])  Labs Reviewed: SGOT: 12 (01/09/2010)   SGPT: <8 U/L (01/09/2010)   HDL:47 (01/09/2010), 47 (09/06/2009)  LDL:114 (01/09/2010), 124 (09/06/2009)  Chol:176 (01/09/2010), 191 (09/06/2009)  Trig:73 (01/09/2010), 98 (09/06/2009)  Complete Medication List: 1)  Xanax 0.5 Mg Tabs (Alprazolam) .... One tab by mouth once daily 2)  Diovan Hct 320-25 Mg Tabs (Valsartan-hydrochlorothiazide) .... One tab by mouth once daily 3)  Vitamin B-6 100 Mg Tabs (Pyridoxine hcl) .... One tab by mouth once daily 4)  Isosorbide Mononitrate Cr 30 Mg Xr24h-tab (Isosorbide mononitrate) .... Take 1 tablet by mouth once a day 5)  Metoprolol Tartrate 50 Mg Tabs (Metoprolol tartrate) .... One half tab by mouth once daily 6)  Fluoxetine Hcl 20 Mg Caps (Fluoxetine hcl) .... One cap by mouth qd 7)  Ketoconazole 2 % Crea (Ketoconazole) .... Apply once daily to affected areas unde both breasts for 3 weeks , then as needed 8)  Fluconazole 150 Mg Tabs (Fluconazole) .... Take 1 tablet by mouth once a day 9)  Glipizide 2.5 Mg Xr24h-tab (Glipizide) .... Take 1 tablet by mouth once a day 10)  Crestor 20 Mg Tabs (Rosuvastatin calcium) .... Take 1 tab by mouth at bedtime 11)  Exelon 4.6 Mg/24hr Pt24 (Rivastigmine) .... Apply one patch every day to upper chest or back or uopper  arms  Other Orders: T-CBC w/Diff ([id number]) T-Vitamin D (25-Hydroxy) [id number]) Radiology Referral (Radiology)  Patient Instructions: 1)  F/U end August 2)  It is important that you exercise regularly at least 20 minutes 5 times a week. If you develop chest pain, have severe difficulty breathing, or feel very tired , stop exercising immediately and seek medical attention. 3)  You need to lose weight. Consider a lower calorie diet and regular exercise.  4)  Check your blood sugars regularly. If your readings are usually above : or below 70 you should contact our office. 5)  BMP prior to visit, ICD-9: 6)  Hepatic Panel prior to visit, ICD-9: 7)  Lipid Panel prior to visit, ICD-9:   fasting end August 8)  CBC w/ Diff prior to visit, ICD-9: 9)  Vit D 10)  New med for your cholesterol start when you finish the simvastatin, the new med is crestor. 11)  New med for memory 12)  We will sched your mamo 13)  HbgA1C prior to visit, ICD-9: Prescriptions: EXELON 4.6 MG/24HR PT24 (RIVASTIGMINE) apply one patch every  day to upper chest or back or uopper arms  #30 x 2   Entered and Authorized by:   Syliva Overman MD   Signed by:   Syliva Overman MD on 04/25/2010   Method used:   Electronically to        [facility] (retail)       [address]       [place], [place]  [address]       Ph: [phone]       Fax: [phone]   RxID:   [id number] CRESTOR 20 MG TABS (ROSUVASTATIN CALCIUM) Take 1 tab by mouth at bedtime  #30 x 3   Entered and Authorized by:   Syliva Overman MD   Signed by:   Syliva Overman MD on 04/25/2010   Method used:   Printed then faxed to ...       [facility] (retail)       [address]       [place], [place]  [address]       Ph: [phone]       Fax: [phone]   RxID:   [id number] DIOVAN HCT 320-25 MG  TABS (VALSARTAN-HYDROCHLOROTHIAZIDE) one tab by mouth once daily  #56 x  0   Entered by:   Everitt Amber LPN   Authorized by:   Syliva Overman MD   Signed by:   Everitt Amber LPN on 05/03/1600   Method used:   Samples Given   RxID:   [id number]

## 2010-12-04 NOTE — Progress Notes (Signed)
Summary: sore under breasts and breaking out  Phone Note Call from Patient   Summary of Call: does not feel good and wants to be seen today  coughing   sore  under breasts  and breaking out  call back at [phone] we have no where to put her at Initial call taken by: Lind Guest,  September 19, 2010 9:42 AM  Follow-up for Phone Call        we had a cancelation for tomorrow, can you put her in there? Follow-up by: Adella Hare LPN,  September 19, 2010 1:13 PM  Additional Follow-up for Phone Call Additional follow up Details #1::        PATIENT IS HERE Additional Follow-up by: Lind Guest,  September 19, 2010 1:14 PM

## 2010-12-04 NOTE — Letter (Signed)
Summary: X RAYS  X RAYS   Imported By: Lind Guest 04/17/2010 08:50:22  _____________________________________________________________________  External Attachment:    Type:   Image     Comment:   External Document

## 2010-12-04 NOTE — Assessment & Plan Note (Signed)
Summary: office visit   Vital Signs:  Patient profile:   77 year old female Menstrual status:  hysterectomy Height:      61 inches Weight:      213 pounds BMI:     40.39 O2 Sat:      89 % Pulse rate:   80 / minute Pulse rhythm:   regular Resp:     16 per minute BP sitting:   134 / 70 Cuff size:   large  Vitals Entered By: Everitt Amber LPN (January 09, 1609 9:55 AM)  Nutrition Counseling: Patient's BMI is greater than 25 and therefore counseled on weight management options. CC: fingers and hands staying numb, also feeling swimmy headed sometimes   Primary Care Provider:  dr Lodema Hong  CC:  fingers and hands staying numb and also feeling swimmy headed sometimes.  History of Present Illness: 2 week h/o puritic rash under breasts. Pt also reports numbness in the fingers and hands, she has had this for some time , and I believe it is multifactorial a,d relate to diabetic neuropathy as wellas carpal tunnel. Reports  that she has been doing well, otherwise. Denies recent fever or chills. Denies sinus pressure, nasal congestion , ear pain or sore throat. Denies chest congestion, or cough productive of sputum. Denies chest pain, palpitations, PND, orthopnea or leg swelling. Denies abdominal pain, nausea, vomitting, diarrhea or constipation. Denies change in bowel movements or bloody stool. Denies dysuria , frequency, incontinence or hesitancy. she does have joint pain and stifnesss, affecting spine, hips and knees  Denies headaches, vertigo, seizures. Denies depression, anxiety or insomnia.she is currently on medication for these symptoms. she denies polyuria, polydtpsia, blurred vision or hypoglycemic episodes, she tests infrequently. She has seasonal allergies which tend to flare up in the Spring.      Current Medications (verified): 1)  Xanax 0.5 Mg  Tabs (Alprazolam) .... One Tab By Mouth Once Daily 2)  Diovan Hct 320-25 Mg  Tabs (Valsartan-Hydrochlorothiazide) .... One Tab By  Mouth Once Daily 3)  Vitamin B-6 100 Mg  Tabs (Pyridoxine Hcl) .... One Tab By Mouth Once Daily 4)  Isosorbide Mononitrate Cr 30 Mg Xr24h-Tab (Isosorbide Mononitrate) .... Take 1 Tablet By Mouth Once A Day 5)  Actos 15 Mg  Tabs (Pioglitazone Hcl) .... One Tab By Mouth Once Daily 6)  Metoprolol Tartrate 50 Mg  Tabs (Metoprolol Tartrate) .... One Half Tab By Mouth Once Daily 7)  Simvastatin 40 Mg Tabs (Simvastatin) .... Take One Tab By Mouth At Bedtime 8)  Fluoxetine Hcl 20 Mg Caps (Fluoxetine Hcl) .... One Cap By Mouth Qd  Allergies (verified): No Known Drug Allergies  Review of Systems      See HPI Eyes:  Denies blurring, discharge, and double vision. Heme:  Denies abnormal bruising and bleeding.  Physical Exam  General:  Well-developed,overweight,in no acute distress; alert,appropriate and cooperative throughout examination HEENT: No facial asymmetry,  EOMI, No sinus tenderness, TM's Clear, oropharynx  pink and moist.   Chest: Clear to auscultation bilaterally.  CVS: S1, S2, No murmurs, No S3.   Abd: Soft, Nontender.  MS: decreased ROM spine, hips, shoulders and knees.  Ext: No edema.   CNS: CN 2-12 intact, power tone and sensation normal throughout.   Skin: extensive hypopigmented rash under both breasts.  Psych: Good eye contact, normal affect.  Memory intact, not anxious or depressed appearing.    Impression & Recommendations:  Problem # 1:  DIABETES MELLITUS (ICD-250.00) Assessment Comment Only  The following  medications were removed from the medication list:    Actos 15 Mg Tabs (Pioglitazone hcl) ..... One tab by mouth once daily Her updated medication list for this problem includes:    Diovan Hct 320-25 Mg Tabs (Valsartan-hydrochlorothiazide) ..... One tab by mouth once daily    Glipizide 2.5 Mg Xr24h-tab (Glipizide) .Marland Kitchen... Take 1 tablet by mouth once a day  Orders: Glucose, (CBG) ([id number]) T- Hemoglobin A1C ([id number])  Problem # 2:  DEPRESSION  (ICD-311) Assessment: Improved  Her updated medication list for this problem includes:    Xanax 0.5 Mg Tabs (Alprazolam) ..... One tab by mouth once daily    Fluoxetine Hcl 20 Mg Caps (Fluoxetine hcl) ..... One cap by mouth qd  Problem # 3:  HYPERLIPIDEMIA (ICD-272.4) Assessment: Comment Only  Her updated medication list for this problem includes:    Simvastatin 40 Mg Tabs (Simvastatin) .Marland Kitchen... Take one tab by mouth at bedtime  Orders: T-Hepatic Function [id number]) T-Lipid Profile [id number])  Labs Reviewed: SGOT: 12 (09/06/2009)   SGPT: 8 (09/06/2009)   HDL:47 (09/06/2009), 45 (05/29/2009)  LDL:124 (09/06/2009), 89 (52/84/1324)  Chol:191 (09/06/2009), 148 (05/29/2009)  Trig:98 (09/06/2009), 69 (05/29/2009)  Problem # 4:  HYPERTENSION (ICD-401.9) Assessment: Unchanged  Her updated medication list for this problem includes:    Diovan Hct 320-25 Mg Tabs (Valsartan-hydrochlorothiazide) ..... One tab by mouth once daily    Metoprolol Tartrate 50 Mg Tabs (Metoprolol tartrate) ..... One half tab by mouth once daily  Orders: T-Basic Metabolic Panel [id number])  BP today: 134/70 Prior BP: 120/80 (09/06/2009)  Labs Reviewed: K+: 4.6 (09/06/2009) Creat: : 0.95 (09/06/2009)   Chol: 191 (09/06/2009)   HDL: 47 (09/06/2009)   LDL: 124 (09/06/2009)   TG: 98 (09/06/2009)  Problem # 5:  DERMATOMYCOSIS (ICD-111.9) Assessment: Deteriorated  Her updated medication list for this problem includes:    Ketoconazole 2 % Crea (Ketoconazole) .Marland Kitchen... Apply once daily to affected areas unde both breasts for 3 weeks , then as needed    Fluconazole 150 Mg Tabs (Fluconazole) .Marland Kitchen... Take 1 tablet by mouth once a day  Complete Medication List: 1)  Xanax 0.5 Mg Tabs (Alprazolam) .... One tab by mouth once daily 2)  Diovan Hct 320-25 Mg Tabs (Valsartan-hydrochlorothiazide) .... One tab by mouth once daily 3)  Vitamin B-6 100 Mg Tabs (Pyridoxine hcl) .... One tab by mouth once daily 4)  Isosorbide  Mononitrate Cr 30 Mg Xr24h-tab (Isosorbide mononitrate) .... Take 1 tablet by mouth once a day 5)  Metoprolol Tartrate 50 Mg Tabs (Metoprolol tartrate) .... One half tab by mouth once daily 6)  Simvastatin 40 Mg Tabs (Simvastatin) .... Take one tab by mouth at bedtime 7)  Fluoxetine Hcl 20 Mg Caps (Fluoxetine hcl) .... One cap by mouth qd 8)  Ketoconazole 2 % Crea (Ketoconazole) .... Apply once daily to affected areas unde both breasts for 3 weeks , then as needed 9)  Fluconazole 150 Mg Tabs (Fluconazole) .... Take 1 tablet by mouth once a day 10)  Glipizide 2.5 Mg Xr24h-tab (Glipizide) .... Take 1 tablet by mouth once a day  Patient Instructions: 1)  Please schedule a follow-up appointment in 3.5 months. 2)  It is important that you exercise regularly at least 20 minutes 5 times a week. If you develop chest pain, have severe difficulty breathing, or feel very tired , stop exercising immediately and seek medical attention. 3)  You need to lose weight. Consider a lower calorie diet and regular exercise.  4)  medicationis sent in for fungal  infection of the skin under your breasts, a cream and some tablets. 5)  BMP prior to visit, ICD-9: 6)  Hepatic Panel prior to visit, ICD-9:  fasting today 7)  Lipid Panel prior to visit, ICD-9: 8)  HbgA1C prior to visit, ICD-9: 9)  I am changing your blood sugar medication, pls stop actos 10)  Schedule your mammogram. Prescriptions: FLUOXETINE HCL 20 MG CAPS (FLUOXETINE HCL) one cap by mouth qd  #30 x 5   Entered by:   Everitt Amber LPN   Authorized by:   Syliva Overman MD   Signed by:   Everitt Amber LPN on 45/40/9811   Method used:   Electronically to        [facility] (retail)       [address] [place], [place]  [address]       Ph: [phone]       Fax: [phone]   RxID:   [id number] DIOVAN HCT 320-25 MG  TABS (VALSARTAN-HYDROCHLOROTHIAZIDE) one tab by mouth once daily  #30 x 5   Entered by:    Everitt Amber LPN   Authorized by:   Syliva Overman MD   Signed by:   Everitt Amber LPN on 66/44/0347   Method used:   Electronically to        [facility] (retail)       [address]       [place], [place]  [address]       Ph: [phone]       Fax: [phone]   RxID:   [id number] GLIPIZIDE 2.5 MG XR24H-TAB (GLIPIZIDE) Take 1 tablet by mouth once a day  #30 x 3   Entered and Authorized by:   Syliva Overman MD   Signed by:   Syliva Overman MD on 01/09/2010   Method used:   Printed then faxed to ...       [facility] (retail)       [address]       [place], [place]  [address]       Ph: [phone]       Fax: [phone]   RxID:   [id number] FLUCONAZOLE 150 MG TABS (FLUCONAZOLE) Take 1 tablet by mouth once a day  #5 x 0   Entered and Authorized by:   Syliva Overman MD   Signed by:   Syliva Overman MD on 01/09/2010   Method used:   Electronically to        [facility] (retail)       [address] [place], [place]  [address]       Ph: [phone]       Fax: [phone]   RxID:   [id number] KETOCONAZOLE 2 % CREA (KETOCONAZOLE) apply once daily to affected areas unde both breasts for 3 weeks , then as needed  #30gm x 2   Entered and Authorized by:   Syliva Overman MD   Signed by:   Syliva Overman MD on 01/09/2010   Method used:   Electronically to        [facility] (retail)       [address]  [place], [place]  [address]       Ph: [phone]       Fax: [phone]   RxID:   [id number]   Laboratory Results   Blood Tests   Date/Time Received: January 09, 2010  Date/Time Reported: January 09, 2010   Glucose (fasting): 126 mg/dL   (Normal Range: 84-132)

## 2010-12-04 NOTE — Letter (Signed)
Summary: DEMO  DEMO   Imported By: Lind Guest 04/17/2010 08:46:49  _____________________________________________________________________  External Attachment:    Type:   Image     Comment:   External Document

## 2010-12-04 NOTE — Letter (Signed)
Summary: CONSULTS  CONSULTS   Imported By: Lind Guest 04/17/2010 08:46:14  _____________________________________________________________________  External Attachment:    Type:   Image     Comment:   External Document

## 2011-01-08 ENCOUNTER — Encounter: Payer: Self-pay | Admitting: Family Medicine

## 2011-01-08 ENCOUNTER — Other Ambulatory Visit: Payer: Self-pay | Admitting: Family Medicine

## 2011-01-08 ENCOUNTER — Ambulatory Visit (INDEPENDENT_AMBULATORY_CARE_PROVIDER_SITE_OTHER): Payer: Medicare Other | Admitting: Family Medicine

## 2011-01-08 DIAGNOSIS — F329 Major depressive disorder, single episode, unspecified: Secondary | ICD-10-CM

## 2011-01-08 DIAGNOSIS — I10 Essential (primary) hypertension: Secondary | ICD-10-CM

## 2011-01-08 DIAGNOSIS — N3000 Acute cystitis without hematuria: Secondary | ICD-10-CM

## 2011-01-08 DIAGNOSIS — E119 Type 2 diabetes mellitus without complications: Secondary | ICD-10-CM

## 2011-01-08 LAB — CONVERTED CEMR LAB
Bilirubin Urine: NEGATIVE
CO2: 29 meq/L (ref 19–32)
Calcium: 8.9 mg/dL (ref 8.4–10.5)
Eosinophils Relative: 3 % (ref 0–5)
Glucose, Bld: 101 mg/dL — ABNORMAL HIGH (ref 70–99)
HCT: 40.2 % (ref 36.0–46.0)
Hemoglobin: 12.6 g/dL (ref 12.0–15.0)
Lymphocytes Relative: 20 % (ref 12–46)
Lymphs Abs: 1.7 10*3/uL (ref 0.7–4.0)
Platelets: 261 10*3/uL (ref 150–400)
Protein, U semiquant: NEGATIVE
Sodium: 142 meq/L (ref 135–145)
Urobilinogen, UA: 1
WBC: 8.8 10*3/uL (ref 4.0–10.5)

## 2011-01-08 LAB — CBC WITH DIFFERENTIAL/PLATELET
Lymphocytes Relative: 20 % (ref 12–46)
Lymphs Abs: 1.7 10*3/uL (ref 0.7–4.0)
Neutro Abs: 6 10*3/uL (ref 1.7–7.7)
Neutrophils Relative %: 68 % (ref 43–77)
Platelets: 261 10*3/uL (ref 150–400)
RBC: 4.82 MIL/uL (ref 3.87–5.11)
WBC: 8.8 10*3/uL (ref 4.0–10.5)

## 2011-01-08 LAB — BASIC METABOLIC PANEL
CO2: 29 mEq/L (ref 19–32)
Calcium: 8.9 mg/dL (ref 8.4–10.5)
Chloride: 105 mEq/L (ref 96–112)
Potassium: 4.3 mEq/L (ref 3.5–5.3)
Sodium: 142 mEq/L (ref 135–145)

## 2011-01-08 LAB — HEMOGLOBIN A1C: Mean Plasma Glucose: 128 mg/dL — ABNORMAL HIGH (ref <117)

## 2011-01-13 DIAGNOSIS — N3000 Acute cystitis without hematuria: Secondary | ICD-10-CM | POA: Insufficient documentation

## 2011-01-18 ENCOUNTER — Ambulatory Visit: Payer: Self-pay | Admitting: Family Medicine

## 2011-01-22 NOTE — Assessment & Plan Note (Signed)
Summary: f up   Vital Signs:  Patient profile:   77 year old female Menstrual status:  hysterectomy Height:      61 inches Weight:      214.50 pounds BMI:     40.68 O2 Sat:      94 % Pulse rate:   96 / minute Pulse rhythm:   regular Resp:     16 per minute BP sitting:   152 / 74  (left arm) Cuff size:   large  Vitals Entered By: Everitt Amber LPN (January 08, 2955 11:33 AM)  Nutrition Counseling: Patient's BMI is greater than 25 and therefore counseled on weight management options. CC: Follow up chronic problems, c/o hands have been feeling numb    Primary Care Provider:  dr Lodema Hong  CC:  Follow up chronic problems and c/o hands have been feeling numb .  History of Present Illness: Reports  that she has been fairly well, but is not what she has been in the past. Denies recent fever or chills. Denies sinus pressure, nasal congestion , ear pain or sore throat. Denies chest congestion, or cough productive of sputum. Denies chest pain, palpitations, PND, orthopnea or leg swelling. Denies abdominal pain, nausea, vomitting, diarrhea or constipation. Denies change in bowel movements or bloody stool. Denies dysuria , frequency, incontinence or hesitancy.  Denies headaches, vertigo, seizures. Denies uncontrolled depression, anxiety or insomnia. Denies  rash, lesions, or itch.     Current Medications (verified): 1)  Xanax 0.5 Mg  Tabs (Alprazolam) .... One Tab By Mouth Once Daily 2)  Diovan Hct 320-25 Mg  Tabs (Valsartan-Hydrochlorothiazide) .... One Tab By Mouth Once Daily 3)  Vitamin B-6 100 Mg  Tabs (Pyridoxine Hcl) .... One Tab By Mouth Once Daily 4)  Isosorbide Mononitrate Cr 30 Mg Xr24h-Tab (Isosorbide Mononitrate) .... Take 1 Tablet By Mouth Once A Day 5)  Metoprolol Tartrate 50 Mg  Tabs (Metoprolol Tartrate) .... One Half Tab By Mouth Once Daily 6)  Fluoxetine Hcl 20 Mg Caps (Fluoxetine Hcl) .... One Cap By Mouth Qd 7)  Exelon 9.5 Mg/24hr Pt24 (Rivastigmine) .... Apply One  Patch Daily 8)  Celebrex 200 Mg Caps (Celecoxib) .... Take 1 Tablet By Mouth Once A Day  Allergies (verified): No Known Drug Allergies  Review of Systems      See HPI General:  Complains of fatigue. Eyes:  Denies discharge, eye pain, and red eye. GU:  Complains of dysuria and urinary frequency; 2 day history. MS:  Complains of joint pain, low back pain, mid back pain, and stiffness. Neuro:  Complains of numbness and tingling. Psych:  Complains of anxiety and depression; denies mental problems, suicidal thoughts/plans, thoughts of violence, and unusual visions or sounds. Endo:  Denies excessive thirst and excessive urination. Heme:  Denies abnormal bruising and bleeding. Allergy:  Complains of seasonal allergies.  Physical Exam  General:  Well-developed,obese,in no acute distress; alert,appropriate and cooperative throughout examination HEENT: No facial asymmetry,  EOMI, No sinus tenderness, TM's Clear, oropharynx  pink and moist.   Chest: Clear to auscultation bilaterally.  CVS: S1, S2, No murmurs, No S3.   Abd: Soft, Nontender.  OZ:HYQMVHQIO  ROM spine, hips, shoulders and knees. positive tinnels with bilateal thenar wasting Ext: No edema.   CNS: CN 2-12 intact, reduced power in hands  Skin: Intact, no visible lesions or rashes.  Psych: Good eye contact, normal affect.  Memory loss not anxious or depressed appearing.    Impression & Recommendations:  Problem # 1:  CARPAL TUNNEL SYNDROME, BILATERAL (ICD-354.0) Assessment Deteriorated  Problem # 2:  DEMENTIA (ICD-294.8) Assessment: Unchanged  Problem # 3:  DIABETES MELLITUS (ICD-250.00) Assessment: Comment Only  Her updated medication list for this problem includes:    Diovan Hct 320-25 Mg Tabs (Valsartan-hydrochlorothiazide) ..... One tab by mouth once daily Patient advised to reduce carbs and sweets, commit to regular physical activity, take meds as prescribed, test blood sugars as directed, and attempt to lose weight  , to improve blood sugar control.  Orders: T- Hemoglobin A1C ([id number])  Labs Reviewed: Creat: 0.82 (09/19/2010)    Reviewed HgBA1c results: 6.0 (09/19/2010)  6.1 (01/09/2010)  Problem # 4:  HYPERTENSION (ICD-401.9) Assessment: Deteriorated  Her updated medication list for this problem includes:    Diovan Hct 320-25 Mg Tabs (Valsartan-hydrochlorothiazide) ..... One tab by mouth once daily    Metoprolol Tartrate 50 Mg Tabs (Metoprolol tartrate) ..... One half tab by mouth once daily  Orders: Medicare Electronic Prescription [phone]) T-Basic Metabolic Panel [phone])  BP today: 152/74 Prior BP: 130/80 (09/19/2010)  Labs Reviewed: K+: 4.4 (09/19/2010) Creat: : 0.82 (09/19/2010)   Chol: 200 (09/19/2010)   HDL: 48 (09/19/2010)   LDL: 133 (09/19/2010)   TG: 95 (09/19/2010)  Problem # 5:  ACUTE CYSTITIS (ICD-595.0) Assessment: Comment Only  Encouraged to push clear liquids, get enough rest, and take acetaminophen as needed. To be seen in 10 days if no improvement, sooner if worse. cCUA is negaitive, pt is reassured no sign of infection  Complete Medication List: 1)  Xanax 0.5 Mg Tabs (Alprazolam) .... One tab by mouth once daily 2)  Diovan Hct 320-25 Mg Tabs (Valsartan-hydrochlorothiazide) .... One tab by mouth once daily 3)  Vitamin B-6 100 Mg Tabs (Pyridoxine hcl) .... One tab by mouth once daily 4)  Isosorbide Mononitrate Cr 30 Mg Xr24h-tab (Isosorbide mononitrate) .... Take 1 tablet by mouth once a day 5)  Metoprolol Tartrate 50 Mg Tabs (Metoprolol tartrate) .... One half tab by mouth once daily 6)  Fluoxetine Hcl 20 Mg Caps (Fluoxetine hcl) .... One cap by mouth qd 7)  Exelon 9.5 Mg/24hr Pt24 (Rivastigmine) .... Apply one patch daily 8)  Celebrex 200 Mg Caps (Celecoxib) .... Take 1 tablet by mouth once a day  Other Orders: T-CBC w/Diff ([id number]) T-TSH ([id number]) Urinalysis ([id number])  Patient Instructions: 1)  Please schedule a follow-up appointment in  3 months. 2)  BMP prior to visit, ICD-9: 3)  TSH prior to visit, ICD-9: 4)  CBC w/ Diff prior to visit, ICD-9: 5)  HBA1C   today. 6)  No med changes at this time. 7)  We are checking your urine for infectiion and will treat if abn Prescriptions: XANAX 0.5 MG  TABS (ALPRAZOLAM) one tab by mouth once daily  #30 x 1   Entered by:   Adella Hare LPN   Authorized by:   Syliva Overman MD   Signed by:   Adella Hare LPN on 36/64/4034   Method used:   Printed then faxed to ...       [facility]* (retail)       [address]       [place], [place]  [address]       Ph: [phone]       Fax: [phone]   RxID:   [id number] CELEBREX 200 MG CAPS (CELECOXIB) Take 1 tablet by mouth once a day  #30 x 3   Entered by:   Adella Hare  LPN   Authorized by:   Syliva Overman MD   Signed by:   Adella Hare LPN on 16/08/9603   Method used:   Electronically to        [facility]* (retail)       [address] [place], [place]  [address]       Ph: [phone]       Fax: [phone]   RxID:   [id number] FLUOXETINE HCL 20 MG CAPS (FLUOXETINE HCL) one cap by mouth qd  #30 x 3   Entered by:   Adella Hare LPN   Authorized by:   Syliva Overman MD   Signed by:   Adella Hare LPN on 08/31/2535   Method used:   Electronically to        [facility]* (retail)       [address] [place], [place]  [address]       Ph: [phone]       Fax: [phone]   RxID:   [id number] METOPROLOL TARTRATE 50 MG  TABS (METOPROLOL TARTRATE) one half tab by mouth once daily  #30 x 3   Entered by:   Adella Hare LPN   Authorized by:   Syliva Overman MD   Signed by:   Adella Hare LPN on 32/35/5732   Method used:   Electronically to        [facility]* (retail)       [address] [place], [place]  [address]       Ph: [phone]       Fax:  [phone]   RxID:   [id number] ISOSORBIDE MONONITRATE CR 30 MG XR24H-TAB (ISOSORBIDE MONONITRATE) Take 1 tablet by mouth once a day  #30 Each x 3   Entered by:   Adella Hare LPN   Authorized by:   Syliva Overman MD   Signed by:   Adella Hare LPN on 35/00/9381   Method used:   Electronically to        [facility]* (retail)       [address]       [place], [place]  [address]       Ph: [phone]       Fax: [phone]   RxID:   [id number] DIOVAN HCT 320-25 MG  TABS (VALSARTAN-HYDROCHLOROTHIAZIDE) one tab by mouth once daily  #30 x 3   Entered by:   Adella Hare LPN   Authorized by:   Syliva Overman MD   Signed by:   Adella Hare LPN on 15/40/0867   Method used:   Electronically to        [facility]* (retail)       [address]       [place], [place]  [address]       Ph: [phone]       Fax: [phone]   RxID:   [id number]    Orders Added: 1)  Est. Patient Level IV [09735] 2)  Medicare Electronic Prescription [G8553] 3)  T-Basic Metabolic Panel [[id number]] 4)  T-CBC w/Diff [32992-42683] 5)  T- Hemoglobin A1C [83036-23375] 6)  T-TSH [41962-22979] 7)  Urinalysis [[id number]]  Laboratory Results   Urine Tests  Date/Time Received: January 08, 2011 1:40 PM  Date/Time Reported: January 08, 2011 1:40 PM   Routine Urinalysis   Color: yellow Appearance: Clear Glucose: negative   (Normal Range: Negative) Bilirubin: negative   (Normal Range: Negative) Ketone: trace (5)   (Normal Range: Negative) Spec. Gravity: >=1.030   (Normal Range: 1.003-1.035) Blood: trace-lysed   (Normal Range: Negative) pH: 5.5   (Normal Range: 5.0-8.0) Protein: negative   (Normal Range: Negative) Urobilinogen: 1.0   (Normal Range: 0-1) Nitrite: negative   (Normal Range: Negative) Leukocyte Esterace: negative   (Normal Range: Negative)

## 2011-01-24 ENCOUNTER — Encounter: Payer: Self-pay | Admitting: Family Medicine

## 2011-01-24 ENCOUNTER — Ambulatory Visit (INDEPENDENT_AMBULATORY_CARE_PROVIDER_SITE_OTHER): Payer: PRIVATE HEALTH INSURANCE | Admitting: Family Medicine

## 2011-01-24 ENCOUNTER — Telehealth: Payer: Self-pay | Admitting: Family Medicine

## 2011-01-24 VITALS — BP 150/70 | HR 89 | Temp 99.0°F | Resp 16 | Ht 62.0 in | Wt 219.4 lb

## 2011-01-24 DIAGNOSIS — E119 Type 2 diabetes mellitus without complications: Secondary | ICD-10-CM

## 2011-01-24 DIAGNOSIS — I10 Essential (primary) hypertension: Secondary | ICD-10-CM

## 2011-01-24 DIAGNOSIS — B369 Superficial mycosis, unspecified: Secondary | ICD-10-CM

## 2011-01-24 DIAGNOSIS — R413 Other amnesia: Secondary | ICD-10-CM

## 2011-01-24 MED ORDER — MICONAZOLE-ZINC OXIDE-PETROLAT 0.25-15-81.35 % EX OINT: 2.0000 g | TOPICAL_OINTMENT | Freq: Two times a day (BID) | CUTANEOUS | Status: AC

## 2011-01-24 NOTE — Patient Instructions (Addendum)
Medication is sent in for the rash under your breasts, please  use twice daily.  I  am concerned about your memory.  You really need to consider getting someone to stay with you or moving into a family home.

## 2011-01-24 NOTE — Progress Notes (Signed)
  Subjective:    Patient ID: [name], female    DOB: 1926-03-23, 77 y.o.   MRN: [id number]  Diabetes She presents for her follow-up diabetic visit. She has type 2 diabetes mellitus. No MedicAlert identification noted. Her disease course has been stable. There are no hypoglycemic associated symptoms. Symptoms are stable. Diabetic complications include autonomic neuropathy. Risk factors for coronary artery disease include dyslipidemia, hypertension, post-menopausal and sedentary lifestyle. When asked about current treatments, none were reported. Her weight is stable. She has not had a previous visit with a dietician. She rarely participates in exercise. An ACE inhibitor/angiotensin II receptor blocker is being taken. She does not see a podiatrist. [name] is awareof increased confusion and memory loss, states she knows she is not as sharp as she used to be, but will not consider changing her living arrangements at this time. Shec/o puritic rash which is worsening  Review of Systems  Skin: Positive for color change and rash.       Rash under both breasts x 2 months, puritic   Denies recent fever or chills. Denies sinus pressure, nasal congestion, ear pain or sore throat. Denies chest congestion, productive cough or wheezing. Denies chest pains, palpitations, paroxysmal nocturnal dyspnea, orthopnea and leg swelling Denies abdominal pain, nausea, vomiting,diarrhea or constipation.  Denies rectal bleeding or change in bowel movement. Denies dysuria, frequency, hesitancy or incontinence. Chronic  joint pain, and limitation in mobility. Denies headaches, seizure, numbness, or tingling. Denies uncontrolled  depression, anxiety or insomnia.     Objective:   Physical Exam Patient alert and in no Cardiopulmonary distress.not orented to time, unable to accurately state year and season HEENT: No facial asymmetry, EOMI, no sinus tenderness, TM's clear, Oropharynx pink and moist.  Neck supple no  adenopathy.  Chest: Clear to auscultation bilaterally.  CVS: S1, S2 no murmurs, no S3.  ABD: Soft non tender. Bowel sounds normal.  Ext: No edema  MS: decreased ROM spine, shoulders, hips and knees.  Skin: severe candidiasis and tinea under the breasts. No cellulitis or purulent drainage or erythema Psych: Good eye contact, normal affect. mildly anxious notdepressed appearing.  CNS: CN 2-12 intact, power and tone  normal throughout.Short term memory loss       Assessment & Plan:  1. Dermatomycosis affecting skin under the breasts, medication prescribed 2. Memory loss worsening; at consent of pt I discussed this with her friend and neighbor who brings her.She is to continue the exelon patch 3.Hypertension : controled 4. Diabetes: conntroled on meds

## 2011-01-24 NOTE — Telephone Encounter (Signed)
Patient was seen in office today.  

## 2011-01-28 ENCOUNTER — Telehealth (INDEPENDENT_AMBULATORY_CARE_PROVIDER_SITE_OTHER): Payer: PRIVATE HEALTH INSURANCE | Admitting: Family Medicine

## 2011-01-28 ENCOUNTER — Other Ambulatory Visit (INDEPENDENT_AMBULATORY_CARE_PROVIDER_SITE_OTHER): Payer: PRIVATE HEALTH INSURANCE | Admitting: *Deleted

## 2011-01-28 DIAGNOSIS — M7989 Other specified soft tissue disorders: Secondary | ICD-10-CM

## 2011-01-28 MED ORDER — FUROSEMIDE 20 MG PO TABS: 20.0000 mg | ORAL_TABLET | ORAL | Status: DC | PRN

## 2011-01-28 MED ORDER — POTASSIUM CHLORIDE CRYS ER 10 MEQ PO TBCR: 10.0000 meq | EXTENDED_RELEASE_TABLET | Freq: Two times a day (BID) | ORAL | Status: DC | PRN

## 2011-01-28 NOTE — Telephone Encounter (Signed)
I will enter lasix and potassium as tele script[pls send after speaking with the

## 2011-01-28 NOTE — Telephone Encounter (Signed)
Patient aware.

## 2011-03-01 ENCOUNTER — Encounter: Payer: Self-pay | Admitting: Family Medicine

## 2011-03-05 ENCOUNTER — Encounter: Payer: Self-pay | Admitting: Family Medicine

## 2011-03-07 ENCOUNTER — Encounter: Payer: Self-pay | Admitting: Family Medicine

## 2011-03-07 ENCOUNTER — Ambulatory Visit: Payer: Medicare Other | Admitting: Family Medicine

## 2011-03-19 NOTE — Assessment & Plan Note (Signed)
[facility]                       [place] CARDIOLOGY OFFICE NOTE   RYENNE, LYNAM                       MRN:          [id number]  DATE:07/13/2007                            DOB:          28-Nov-1925    PATIENT IDENTIFICATION:  [name] is a 77 year old woman with a  history of CAD with a 70% circumflex lesion back in October 2005 not  felt to be flow limiting. She was last seen in cardiology clinic by Dionicio Stall in May 2006.   In the interval, she denies chest pain, says she just feels kind of  blah. She really has no interest in doing things, her friends invite her  to dinner, she does not really want to go. Denies again chest pain and  PND. She works around her house cleaning, notes no change in her ability  to do this. Appetite is fair.   CURRENT MEDICATIONS:  1. Aspirin 81 mg daily.  2. Os-Cal D 500 t.i.d.  3. Imdur 30.  4. Lopressor 25 b.i.d.  5. Lipitor 20.  6. Actos 15.  7. Diovan 320/25 daily.  8. Xanax p.r.n.   PHYSICAL EXAMINATION:  GENERAL:  The patient is in no distress.  VITAL SIGNS:  Blood pressure is 148/72, pulse is 77 and regular, weight  214 down from 232 in May 2006.  LUNGS:  Clear.  NECK:  JVP is normal, no audible bruits.  CARDIAC:  Regular rate and rhythm, S1, S2, no S3. No murmurs.  EXTREMITIES:  No significant edema.   Carotid ultrasound (done September 4) shows 50-69% stenosis in the right  ICA, less than 50% in the left ICA. Will need followup in about 1 year.   LABORATORY DATA:  Total cholesterol of 126, HDL of 45, LDL of 67,  triglycerides of 70.   IMPRESSION:  1. Coronary artery disease. Appears to be stable. I do not think this      blah feeling is an angina equivalent. I think she may be depressed      and in reflection she wonders if she is too. I would continue on      current regimen.  2. Dyslipidemia good control, would continue.  3. Cardiovascular disease. Will need followup  ultrasounds      periodically.  4. Question depression. She thinks she might have been on an agent but      she is no longer      taking it. Will be in touch with Dr. Anthony Sar office to check on      this and again I told her she should talk about this with Dr.      Lodema Hong. I think it will help in the long run if she is treated.     Pricilla Riffle, MD, [facility]  Electronically Signed    PVR/MedQ  DD: 07/13/2007  DT: 07/14/2007  Job #: [id number]   cc:   Milus Mallick. Lodema Hong, M.D.

## 2011-03-22 NOTE — H&P (Signed)
[name], [name]                ACCOUNT NO.:  [id number]   MEDICAL RECORD NO.:  [id number]          PATIENT TYPE:  AMB   LOCATION:                                FACILITY:  [facility]   PHYSICIAN:  Jerolyn Shin C. Katrinka Blazing, M.D.   DATE OF BIRTH:  [birth year]   DATE OF ADMISSION:  DATE OF DISCHARGE:  LH                                HISTORY & PHYSICAL   A 77 year old female with a history of colon polyp, status post polypectomy  in 2001.  She also has a history of diverticulosis.  She denies difficulty  with bowel movements or rectal bleeding.  She is scheduled for repeat  colonoscopy.   PAST HISTORY:  1.  Atherosclerotic heart disease, status post myocardial infarction in      October 2005.  2.  Hypertension.  3.  Osteoarthritis.  4.  General anxiety disorder.  5.  Hyperlipidemia.  6.  Diabetes mellitus.  7.  Carpal tunnel syndrome.   SURGERY:  1.  Total abdominal hysterectomy.  2.  Appendectomy.  3.  Oophorectomy.   MEDICATIONS:  Not known.  The patient did not bring her medications to the  office and did not remember the names.  She did not call back with her  medications identification.   PHYSICAL EXAMINATION:  VITAL SIGNS:  Blood pressure 138/58, pulse 74,  respirations 20, weight 228 pounds.  HEENT:  Unremarkable.  NECK:  Supple without JVD, bruit, adenopathy, or thyromegaly.  CHEST:  Clear to auscultation.  HEART:  Regular rate and rhythm without murmur, gallop, or rub.  ABDOMEN:  Soft, nontender.  No masses.  She has a large umbilical hernia.  She has three linear scars in her lower abdomen.  EXTREMITIES:  No cyanosis, clubbing, or edema.  NEUROLOGIC:  No focal motor, sensory, or cerebellar deficit.   IMPRESSION:  1.  History of colon polyps.  2.  Hypertension.  3.  Diabetes mellitus.  4.  Atherosclerotic heart disease.   PLAN:  Total colonoscopy.       LCS/MEDQ  D:  05/08/2005  T:  05/08/2005  Job:  [id number]

## 2011-03-22 NOTE — Cardiovascular Report (Signed)
[name], [name] NO.:  [id number]   MEDICAL RECORD NO.:  [id number]          PATIENT TYPE:  OIB   LOCATION:  6501                         FACILITY:  [facility]   PHYSICIAN:  Jonelle Sidle, M.D. LHCDATE OF BIRTH:  1926-02-12   DATE OF PROCEDURE:  08/07/2004  DATE OF DISCHARGE:                              CARDIAC CATHETERIZATION   PRIMARY CARE PHYSICIAN:  Milus Mallick. Lodema Hong, M.D.   CARDIOLOGIST:  Vida Roller, M.D.   INDICATIONS:  Ms. Daudelin is a 77 year old woman with a history of  hypertension, dyslipidemia and prior tobacco use.  She had a Cardiolite  approximately one year ago that showed no clear evidence of ischemia, but  since that time has developed symptoms of recurrent chest discomfort.  She  is referred now by Dr. Dorethea Clan for clear definition of her coronary anatomy  via coronary angiography.   PROCEDURES PERFORMED:  1.  Left heart catheterization.  2.  Selective coronary angiography.  3.  Left ventriculogram.   ACCESS AND EQUIPMENT:  The area about the right femoral was anesthetized  with 1% lidocaine and a 4 French sheath was placed in the right femoral  artery via the modified Seldinger technique.  Standard preformed 6 [place] and JR4 catheters were used for selective coronary angiography and an  angled pigtail catheter was used for left heart catheterization and left  ventriculogram.  All exchanges were made over a wire.  The patient tolerated  the procedure well without immediate complications.   HEMODYNAMICS:  Left ventricle 153/21 mmHg post angiography.  Aorta 153/64  mmHg.   ANGIOGRAPHIC FINDINGS:  1.  The left main coronary artery is free of significant flow-limiting      coronary atherosclerosis.  2.  The left anterior descending is a medium-caliber vessel with two      diagonal branches.  There is a 30% ostial stenosis noted, as well as      other 20% diffuse stenoses with no other major flow-limiting lesions.  3.   There is a large ramus intermedius branch noted with a 30-40% proximal      stenosis.  4.  The circumflex coronary artery is medium in caliber with essentially one      obtuse marginal branch.  The proximal circumflex is calcified with a 70%      stenosis at the ostium.  There is a 40% mid vessel stenosis also noted.  5.  The right coronary artery is medium in caliber and dominant with 20-30%      diffuse mid vessel stenosis.   Left ventriculography was performed in the RAO projection and revealed an  ejection fraction of approximately 55-60%.  There is a small area of  pouching in the mid to basal inferior wall that does look to contract and is  not obviously aneurysmal or indicative of scar.  This may be simply a normal  anatomic variant.  No significant mitral regurgitation is noted.   DIAGNOSES:  1.  Coronary artery disease as outlined with a calcified ostial 70%      circumflex stenosis and other mild to  moderate coronary plaquing as      described.  2.  Left ventricular ejection fraction approximately 55-65% with no      significant mitral regurgitation.  3.  Elevated left ventricular end-diastolic pressure of 21 mmHg.   RECOMMENDATIONS:  I reviewed the films with Dr. Samule Ohm and also discussed  them with Dr. Dorethea Clan.  The plan at this point will be to intensify medical  therapy and have close followup in the office, anticipating a followup  Cardiolite to look for potential ischemia in the circumflex distribution.  If on medical therapy there is objective ischemia noted, attention will be  given to potential percutaneous intervention of the circumflex.  Otherwise  medical therapy will be the plan.       SGM/MEDQ  D:  08/07/2004  T:  08/07/2004  Job:  [id number]

## 2011-03-22 NOTE — Op Note (Signed)
[place]  Patient:    [name], INOUE Visit Number: [id number] MRN: [id number]          Service Type: DSU Location: [facility] Attending Physician:  Colbert Ewing Dictated by:   Loreta Ave, M.D. Proc. Date: 08/20/01 Admit Date:  08/20/2001                             Operative Report  PREOPERATIVE DIAGNOSIS:  Right carpal tunnel syndrome.  POSTOPERATIVE DIAGNOSIS:  Right carpal tunnel syndrome.  PROCEDURE:  Right carpal tunnel release.  SURGEON:  Loreta Ave, M.D.  ASSISTANT:  Arlys John D. Petrarca, P.A.-C.  ANESTHESIA:  IV regional.  SPECIMENS:  None.  CULTURES:  None.  DRESSING:  Soft compressive with bulky hand dressing and splint.  DESCRIPTION OF PROCEDURE:  Patient brought to the operating room and placed on the operating table in supine position.  After adequate anesthesia had been obtained, right arm prepped and draped in the usual sterile fashion.  Curved incision along the thenar eminence heading slightly ulnarward of the distal wrist crease.  Skin and subcutaneous tissue divided, avoiding injury to the palmar branch, median nerve.  Flexor retinaculum over the carpal tunnel incised under direct visualization from the forearm fascia proximally to the palmar arch distally.  Marked erythema, bruising, and constriction of the median nerve throughout its entire course.  Improved after release and after epineurotomy but still erythema throughout the entire area.  Bifurcation of the nerve showed that there was a lateral sensory branch that was separate from the main branch.  Motor branches, digital branches identified, protected, decompressed throughout.  Contents of the carpal tunnel inspected and no other findings appreciated.  Wound irrigated.  Skin closed with nylon.  Margins of the wound injected with Marcaine.  A sterile compressive dressing with a bulky hand dressing and splint applied.  Tourniquet deflated and  anesthesia reversed.  Prior to removing tourniquet, margins of the wound injected with Marcaine without epinephrine.  After sterile compressive dressing and splint applied, the patient brought to the recovery room.  Tolerated the surgery well with no complications. Dictated by:   Loreta Ave, M.D. Attending Physician:  Colbert Ewing DD:  08/20/01 TD:  08/21/01 Job: [id number]

## 2011-03-22 NOTE — H&P (Signed)
[name], [name]                ACCOUNT NO.:  [id number]   MEDICAL RECORD NO.:  [id number]          PATIENT TYPE:  AMB   LOCATION:  DAY                           FACILITY:  [facility]   PHYSICIAN:  Jerolyn Shin C. Katrinka Blazing, M.D.   DATE OF BIRTH:  01-31-1926   DATE OF ADMISSION:  DATE OF DISCHARGE:  LH                                HISTORY & PHYSICAL   HISTORY OF PRESENT ILLNESS:  A 77 year old female with history of colon  polyps during colonoscopy in 2001.  Pathology revealed adenomatous polyps.  The patient is scheduled for total colonoscopy.   PAST HISTORY:  1.  Positive for atherosclerotic heart disease, status post myocardial      infarction in October 2005.  2.  Hypertension.  3.  Osteoarthritis.  4.  General anxiety disorder.  5.  Hyperlipidemia.  6.  Diabetes mellitus.  7.  Bilateral carpal tunnel syndrome.   SURGERY:  1.  Total abdominal hysterectomy.  2.  Appendectomy.  3.  Oophorectomy.   MEDICATIONS:  1.  Metoprolol 50 mg one-half b.i.d.  2.  Isosorbide mononitrate 30 mg daily.  3.  Avandia 4 mg daily.  4.  Lipitor 20 mg at bedtime.  5.  Alprazolam 0.5 mg at bedtime.  6.  Diovan HCT 160/12.5 mg two daily.   PHYSICAL EXAMINATION:  VITAL SIGNS:  Blood pressure 138/58, pulse 74,  respirations 20, weight 228 pounds.  HEENT:  Unremarkable.  NECK:  Supple.  No JVD, bruit, adenopathy, or thyromegaly.  CHEST:  Clear to auscultation.  HEART:  Regular rate and rhythm without murmur, gallop, or rub.  ABDOMEN:  Obese, soft, nontender.  Large umbilical incisional hernia.  Three  large scars in the lower abdomen, which are paramedian.  EXTREMITIES:  No clubbing, cyanosis, or edema.  NEUROLOGIC:  No focal motor, sensory, or cerebellar deficit.   IMPRESSION:  1.  History of colon polyps.  2.  Hypertension.  3.  Diabetes mellitus.  4.  Atherosclerotic heart disease.  5.  Hyperlipidemia.  6.  General anxiety disorder.  7.  Osteoarthritis.   PLAN:  Total colonoscopy.       LCS/MEDQ  D:  05/15/2005  T:  05/16/2005  Job:  [id number]   cc:   [facility] [place]

## 2011-03-22 NOTE — Procedures (Signed)
   NAME:  [name], [name] NO.:  [id number]   MEDICAL RECORD NO.:  [id number]                   PATIENT TYPE:  PREC   LOCATION:                                       FACILITY:   PHYSICIAN:  Vida Roller, M.D.                DATE OF BIRTH:  17-Sep-1926   DATE OF PROCEDURE:  04/07/2003  DATE OF DISCHARGE:                                    STRESS TEST   ADENOSINE STRESS TEST   SUMMARY OF HISTORY:  [name] is a 77 year old female who was referred  to Dr. Dorethea Clan on Mar 18, 2003 by Dr. Lodema Hong for further evaluation of  dyspnea.  She states that over the preceding year she has developed dyspnea  associated with minimal exertion.  She denies any associated chest  discomfort.  Her history is notable for hypertension, hyperlipidemia,  bronchial asthma for which she is on nebulizers at home, DJD, and  osteoporosis.   Resting EKG showed normal sinus rhythm, normal axis, nonspecific ST-T wave  changes.  Blood pressure was 150/78.   Utilizing 60 mg of IV adenosine this was infused over four minutes with  Cardiolite administered at three minutes.  During the procedure she did not  have any EKG changes; however, she did develop some chest tightness which  resolved promptly after infusion.  She also developed moderate coughing and  wheezing associated with shortness of breath that did not improve after  infusion.  She was administered an albuterol nebulizer treatment by  respiratory since the patient had forgotten her own inhaler.  After the  breathing treatment her wheezing had resolved.  She still had a slight  residual cough but she was no longer short of breath.   Images and final results are pending Dr. Marchelle Folks review.      Joellyn Rued, P.A. [facility]                    Vida Roller, M.D.    EW/MEDQ  D:  04/07/2003  T:  04/07/2003  Job:  [id number]   cc:   Vida Roller, M.D.  Fax: [phone]   Milus Mallick. Lodema Hong, M.D.  [address]  [place], [place] [address]  Fax: [phone]

## 2011-04-09 ENCOUNTER — Encounter: Payer: Self-pay | Admitting: Family Medicine

## 2011-04-10 ENCOUNTER — Ambulatory Visit: Payer: Medicare Other | Admitting: Family Medicine

## 2011-04-10 ENCOUNTER — Ambulatory Visit (INDEPENDENT_AMBULATORY_CARE_PROVIDER_SITE_OTHER): Payer: PRIVATE HEALTH INSURANCE | Admitting: Family Medicine

## 2011-04-10 ENCOUNTER — Encounter: Payer: Self-pay | Admitting: Family Medicine

## 2011-04-10 VITALS — BP 150/74 | HR 92 | Resp 16 | Ht 62.75 in | Wt 212.1 lb

## 2011-04-10 DIAGNOSIS — F068 Other specified mental disorders due to known physiological condition: Secondary | ICD-10-CM

## 2011-04-10 DIAGNOSIS — I10 Essential (primary) hypertension: Secondary | ICD-10-CM

## 2011-04-10 DIAGNOSIS — F0390 Unspecified dementia without behavioral disturbance: Secondary | ICD-10-CM

## 2011-04-10 DIAGNOSIS — E119 Type 2 diabetes mellitus without complications: Secondary | ICD-10-CM

## 2011-04-10 DIAGNOSIS — F329 Major depressive disorder, single episode, unspecified: Secondary | ICD-10-CM

## 2011-04-10 DIAGNOSIS — E785 Hyperlipidemia, unspecified: Secondary | ICD-10-CM

## 2011-04-10 MED ORDER — DONEPEZIL HCL 5 MG PO TABS: 5.0000 mg | ORAL_TABLET | Freq: Every day | ORAL | Status: DC

## 2011-04-10 NOTE — Patient Instructions (Signed)
F/U in 3 months.  Fasting lipid, HBA1C,  And chem 7 asap  Mammogram due end JUNe we will schedule  New med for memory is sent in, no more patches are being prescribed for this  pls stop smoking , this will help your memory

## 2011-04-22 DIAGNOSIS — F0390 Unspecified dementia without behavioral disturbance: Secondary | ICD-10-CM | POA: Insufficient documentation

## 2011-04-22 NOTE — Assessment & Plan Note (Signed)
Uncontrolled at this visit ,but med needs to  Be refilled

## 2011-04-22 NOTE — Assessment & Plan Note (Signed)
Controlled on diet only, improved

## 2011-04-22 NOTE — Assessment & Plan Note (Signed)
Improved, and stable on current meds , continue same

## 2011-04-22 NOTE — Progress Notes (Signed)
  Subjective:    Patient ID: [name], female    DOB: 07-16-1926, 77 y.o.   MRN: [id number]  HPI The PT is here for follow up and re-evaluation of chronic medical conditions, medication management and review of recent lab and radiology data.  Preventive health is updated, specifically  Cancer screening, Osteoporosis screening and Immunization.   Questions or concerns regarding consultations or procedures which the PT has had in the interim are  addressed. The PT denies any adverse reactions to current medications since the last visit.  There are no new concerns.  There are no specific complaints       Review of Systems Denies recent fever or chills. Denies sinus pressure, nasal congestion, ear pain or sore throat. Denies chest congestion, productive cough or wheezing. Denies chest pains, palpitations, paroxysmal nocturnal dyspnea, orthopnea and leg swelling Denies abdominal pain, nausea, vomiting,diarrhea or constipation.  Denies rectal bleeding or change in bowel movement. Denies dysuria, frequency, hesitancy or incontinence. Chronic joint pain with reduction in mobility, generalized Denies headaches, seizure, numbness, or tingling. Denies uncontrolled depression, anxiety or insomnia. Denies skin break down or rash. Denies polyuria, polydypsia or blurred vision Reports increased forgetfulness, wants med for this        Objective:   Physical Exam Patient alert and oriented and in no Cardiopulmonary distress.  HEENT: No facial asymmetry, EOMI, no sinus tenderness, TM's clear, Oropharynx pink and moist.  Neck decreased ROM no adenopathy.  Chest: Clear to auscultation bilaterally.Decreased air entry throughout  CVS: S1, S2 no murmurs, no S3.  ABD: Soft non tender. Bowel sounds normal.  Ext: No edema  WU:[id number]  ROM spine, shoulders, hips and knees.  Skin: Intact, no ulcerations or rash noted.  Psych: Good eye contact, normal affect. Memory loss, mild to  moderate, not anxious or depressed appearing.  CNS: CN 2-12 intact, power, tone and sensation normal throughout.        Assessment & Plan:

## 2011-04-22 NOTE — Assessment & Plan Note (Signed)
Deteriorating, had been on exelon in the past , will start aricept

## 2011-07-10 ENCOUNTER — Encounter: Payer: Self-pay | Admitting: Family Medicine

## 2011-07-10 LAB — LIPID PANEL
HDL: 44 mg/dL (ref >39)
LDL Cholesterol: 138 mg/dL — ABNORMAL HIGH (ref 0–99)
Total CHOL/HDL Ratio: 4.5 Ratio
Triglycerides: 92 mg/dL (ref <150)

## 2011-07-10 LAB — BASIC METABOLIC PANEL
BUN: 16 mg/dL (ref 6–23)
Chloride: 100 mEq/L (ref 96–112)
Creat: 0.81 mg/dL (ref 0.50–1.10)
Glucose, Bld: 97 mg/dL (ref 70–99)
Potassium: 4.4 mEq/L (ref 3.5–5.3)

## 2011-07-11 ENCOUNTER — Other Ambulatory Visit: Payer: Self-pay | Admitting: Family Medicine

## 2011-07-11 ENCOUNTER — Encounter: Payer: Self-pay | Admitting: Family Medicine

## 2011-07-11 ENCOUNTER — Ambulatory Visit (INDEPENDENT_AMBULATORY_CARE_PROVIDER_SITE_OTHER): Payer: PRIVATE HEALTH INSURANCE | Admitting: Family Medicine

## 2011-07-11 ENCOUNTER — Ambulatory Visit (HOSPITAL_COMMUNITY)
Admission: RE | Admit: 2011-07-11 | Discharge: 2011-07-11 | Disposition: A | Discharge status: Home / Self Care | Payer: No Typology Code available for payment source | Source: Ambulatory Visit | Attending: Family Medicine | Admitting: Family Medicine

## 2011-07-11 VITALS — BP 140/72 | HR 93 | Resp 16 | Ht 62.75 in | Wt 211.0 lb

## 2011-07-11 DIAGNOSIS — E119 Type 2 diabetes mellitus without complications: Secondary | ICD-10-CM

## 2011-07-11 DIAGNOSIS — I10 Essential (primary) hypertension: Secondary | ICD-10-CM

## 2011-07-11 DIAGNOSIS — Z139 Encounter for screening, unspecified: Secondary | ICD-10-CM

## 2011-07-11 DIAGNOSIS — R05 Cough: Secondary | ICD-10-CM | POA: Insufficient documentation

## 2011-07-11 DIAGNOSIS — Z23 Encounter for immunization: Secondary | ICD-10-CM

## 2011-07-11 DIAGNOSIS — J449 Chronic obstructive pulmonary disease, unspecified: Secondary | ICD-10-CM | POA: Insufficient documentation

## 2011-07-11 DIAGNOSIS — M129 Arthropathy, unspecified: Secondary | ICD-10-CM

## 2011-07-11 DIAGNOSIS — B369 Superficial mycosis, unspecified: Secondary | ICD-10-CM

## 2011-07-11 DIAGNOSIS — E785 Hyperlipidemia, unspecified: Secondary | ICD-10-CM

## 2011-07-11 DIAGNOSIS — E669 Obesity, unspecified: Secondary | ICD-10-CM

## 2011-07-11 DIAGNOSIS — F068 Other specified mental disorders due to known physiological condition: Secondary | ICD-10-CM

## 2011-07-11 DIAGNOSIS — J4489 Other specified chronic obstructive pulmonary disease: Secondary | ICD-10-CM | POA: Insufficient documentation

## 2011-07-11 DIAGNOSIS — R059 Cough, unspecified: Secondary | ICD-10-CM | POA: Insufficient documentation

## 2011-07-11 DIAGNOSIS — M1990 Unspecified osteoarthritis, unspecified site: Secondary | ICD-10-CM

## 2011-07-11 MED ORDER — DONEPEZIL HCL 10 MG PO TABS: 10.0000 mg | ORAL_TABLET | Freq: Every day | ORAL | Status: DC

## 2011-07-11 NOTE — Patient Instructions (Addendum)
F/u in January    Flu vaccine today.     You are doing well.  Dose increase on the pill for memory   donepizil to 10mg  one daily  Put vicks on your nails every night for the fungus.  cXR and mammogram   Please think about quitting smoking.  This is very important for your health.  Consider setting a quit date, then cutting back or switching brands to prepare to stop.  Also think of the money you will save every day by not smoking.  Quick Tips to Quit Smoking: Fix a date i.e. keep a date in mind from when you would not touch a tobacco product to smoke  Keep yourself busy and block your mind with work loads or reading books or watching movies in malls where smoking is not allowed  Vanish off the things which reminds you about smoking for example match box, or your favorite lighter, or the pipe you used for smoking, or your favorite jeans and shirt with which you used to enjoy smoking, or the club where you used to do smoking  Try to avoid certain people places and incidences where and with whom smoking is a common factor to add on  Praise yourself with some token gifts from the money you saved by stopping smoking  Anti Smoking teams are there to help you. Join their programs  Anti-smoking Gums are there in many medical shops. Try them to quit smoking   Side-effects of Smoking: Disease caused by smoking cigarettes are emphysema, bronchitis, heart failures  Premature death  Cancer is the major side effect of smoking  Heart attacks and strokes are the quick effects of smoking causing sudden death  Some smokers lives end up with limbs amputated  Breathing problem or fast breathing is another side effect of smoking  Due to more intakes of smokes, carbon mono-oxide goes into your brain and other muscles of the body which leads to swelling of the veins and blockage to the air passage to lungs  Carbon monoxide blocks blood vessels which leads to blockage in the flow of blood to different  major body organs like heart lungs and thus leads to attacks and deaths  During pregnancy smoking is very harmful and leads to premature birth of the infant, spontaneous abortions, low weight of the infant during birth  Fat depositions to narrow and blocked blood vessels causing heart attacks  In many cases cigarette smoking caused infertility in men

## 2011-07-12 MED ORDER — INFLUENZA VAC TYPES A & B PF IM SUSP: 0.5000 mL | Freq: Once | INTRAMUSCULAR | Status: DC

## 2011-07-14 NOTE — Assessment & Plan Note (Signed)
Improved function on medication per report of neighbor who oversees her

## 2011-07-14 NOTE — Progress Notes (Signed)
  Subjective:    Patient ID: [name], female    DOB: 1926/07/13, 77 y.o.   MRN: [id number]  HPI The PT is here for follow up and re-evaluation of chronic medical conditions, medication management and review of any available recent lab and radiology data.  Preventive health is updated, specifically  Cancer screening and Immunization.   Questions or concerns regarding consultations or procedures which the PT has had in the interim are  addressed. The PT denies any adverse reactions to current medications since the last visit.  There are no new concerns.  There are no specific complaints       Review of Systems Denies recent fever or chills. Denies sinus pressure, nasal congestion, ear pain or sore throat. Denies chest congestion, productive cough or wheezing. Denies chest pains, palpitations and leg swelling Denies abdominal pain, nausea, vomiting,diarrhea or constipation.   Denies dysuria, frequency, hesitancy or incontinence. Chronic arthritic pain affecting spine, hips and knees with some limitation in mobility Denies headaches, seizures, numbness, or tingling. Denies depression, anxiety or insomnia. Denies skin break down or rash.        Objective:   Physical Exam Patient alert and oriented and in no cardiopulmonary distress.  HEENT: No facial asymmetry, EOMI, no sinus tenderness,  oropharynx pink and moist.  Neck supple no adenopathy.  Chest: Clear to auscultation bilaterally.Decreased air entry throughout CVS: S1, S2 no murmurs, no S3.  ABD: Soft non tender. Bowel sounds normal.  Ext: No edema  WU:[id number] ROM spine, shoulders, hips and knees.  Skin: Intact, no ulcerations or rash noted.  Psych: Good eye contact, normal affect. Memory impaired, not anxious or depressed appearing.  CNS: CN 2-12 intact, power, tone and sensation normal throughout.        Assessment & Plan:

## 2011-07-14 NOTE — Assessment & Plan Note (Signed)
Uncontrolled, low fat diet discussed and encouraged 

## 2011-07-14 NOTE — Assessment & Plan Note (Signed)
Diet controlled only at this  time

## 2011-07-14 NOTE — Assessment & Plan Note (Signed)
Controlled, no change in medication  

## 2011-07-14 NOTE — Assessment & Plan Note (Signed)
Severe onychomycosis bilaterally, pt to start topical vicks

## 2011-07-16 ENCOUNTER — Other Ambulatory Visit: Payer: Self-pay | Admitting: Family Medicine

## 2011-07-16 ENCOUNTER — Ambulatory Visit (HOSPITAL_COMMUNITY)
Admission: RE | Admit: 2011-07-16 | Discharge: 2011-07-16 | Disposition: A | Discharge status: Home / Self Care | Payer: No Typology Code available for payment source | Source: Ambulatory Visit | Attending: Family Medicine | Admitting: Family Medicine

## 2011-07-16 DIAGNOSIS — Z1231 Encounter for screening mammogram for malignant neoplasm of breast: Secondary | ICD-10-CM | POA: Insufficient documentation

## 2011-07-16 DIAGNOSIS — Z139 Encounter for screening, unspecified: Secondary | ICD-10-CM

## 2011-11-04 ENCOUNTER — Encounter: Payer: Self-pay | Admitting: Family Medicine

## 2011-11-19 ENCOUNTER — Encounter: Payer: Self-pay | Admitting: Family Medicine

## 2011-11-20 ENCOUNTER — Ambulatory Visit (INDEPENDENT_AMBULATORY_CARE_PROVIDER_SITE_OTHER): Payer: No Typology Code available for payment source | Admitting: Family Medicine

## 2011-11-20 ENCOUNTER — Encounter: Payer: Self-pay | Admitting: Family Medicine

## 2011-11-20 VITALS — BP 140/70 | HR 92 | Resp 16 | Ht 62.75 in | Wt 201.0 lb

## 2011-11-20 DIAGNOSIS — F329 Major depressive disorder, single episode, unspecified: Secondary | ICD-10-CM

## 2011-11-20 DIAGNOSIS — E785 Hyperlipidemia, unspecified: Secondary | ICD-10-CM

## 2011-11-20 DIAGNOSIS — F068 Other specified mental disorders due to known physiological condition: Secondary | ICD-10-CM

## 2011-11-20 DIAGNOSIS — E119 Type 2 diabetes mellitus without complications: Secondary | ICD-10-CM

## 2011-11-20 DIAGNOSIS — E559 Vitamin D deficiency, unspecified: Secondary | ICD-10-CM

## 2011-11-20 DIAGNOSIS — R5383 Other fatigue: Secondary | ICD-10-CM

## 2011-11-20 DIAGNOSIS — E669 Obesity, unspecified: Secondary | ICD-10-CM

## 2011-11-20 DIAGNOSIS — I10 Essential (primary) hypertension: Secondary | ICD-10-CM

## 2011-11-20 NOTE — Patient Instructions (Addendum)
F/u in 4 month  HBA1C and chem 7 today  HBA1c, vit d , tSH  And chem 7   You are now only on four medications regularly. Use tylenol only if you have bad arthritis pain

## 2011-11-20 NOTE — Assessment & Plan Note (Signed)
Controlled, will manage with diet only

## 2011-11-20 NOTE — Assessment & Plan Note (Signed)
Improved. Pt applauded on succesful weight loss through lifestyle change, and encouraged to continue same. Weight loss goal set for the next several months.  

## 2011-11-20 NOTE — Assessment & Plan Note (Signed)
Controlled, no change in medication  

## 2011-11-21 LAB — BASIC METABOLIC PANEL
BUN: 12 mg/dL (ref 6–23)
CO2: 33 mEq/L — ABNORMAL HIGH (ref 19–32)
Calcium: 9.5 mg/dL (ref 8.4–10.5)
Chloride: 102 mEq/L (ref 96–112)
Creat: 0.83 mg/dL (ref 0.50–1.10)
Glucose, Bld: 97 mg/dL (ref 70–99)
Potassium: 5.3 mEq/L (ref 3.5–5.3)
Sodium: 142 mEq/L (ref 135–145)

## 2011-11-21 LAB — HEMOGLOBIN A1C
Hgb A1c MFr Bld: 5.9 % — ABNORMAL HIGH (ref <5.7)
Mean Plasma Glucose: 123 mg/dL — ABNORMAL HIGH (ref <117)

## 2011-11-24 NOTE — Assessment & Plan Note (Signed)
Controlled on medication, still somewhat symptomatic at times, but not suicidal or homicidal

## 2011-11-24 NOTE — Progress Notes (Signed)
  Subjective:    Patient ID: [name], female    DOB: 05-01-1926, 78 y.o.   MRN: [id number]  HPI The PT is here for follow up and re-evaluation of chronic medical conditions, medication management and review of any available recent lab and radiology data.  Preventive health is updated, specifically  Cancer screening and Immunization.   Questions or concerns regarding consultations or procedures which the PT has had in the interim are  addressed. The PT denies any adverse reactions to current medications since the last visit.  There are no new concerns.  There are no specific complaints       Review of Systems See HPI Denies recent fever or chills. Denies sinus pressure, nasal congestion, ear pain or sore throat. Denies chest congestion, productive cough or wheezing. Denies chest pains, palpitations and leg swelling Denies abdominal pain, nausea, vomiting,diarrhea or constipation.   Denies dysuria, frequency, hesitancy or incontinence. C/o  joint pain, with some  limitation in mobility.Denies any falls Denies headaches, seizures, numbness, or tingling. Denies uncontrolled  depression, anxiety or insomnia. Denies skin break down or rash.        Objective:   Physical Exam Patient alert and oriented and in no cardiopulmonary distress.  HEENT: No facial asymmetry, EOMI, no sinus tenderness,  oropharynx pink and moist.  Neck supple no adenopathy.  Chest: Clear to auscultation bilaterally.Decreased air entry throughout  CVS: S1, S2 no murmurs, no S3.  ABD: Soft non tender. Bowel sounds normal.  Ext: No edema  MS: Decreased ROM spine, shoulders, hips and knees.  Skin: Intact, no ulcerations or rash noted.  Psych: Good eye contact, normal affect. Memory impaired not anxious or depressed appearing.  CNS: CN 2-12 intact, power, tone and sensation normal throughout.        Assessment & Plan:

## 2011-11-24 NOTE — Assessment & Plan Note (Signed)
Pt to continue medication to assist with memory , no complaints of intolerance to same

## 2011-11-24 NOTE — Assessment & Plan Note (Signed)
Hyperlipidemia:Low fat diet discussed and encouraged.  Dietary management only 

## 2012-01-21 ENCOUNTER — Other Ambulatory Visit: Payer: Self-pay | Admitting: Family Medicine

## 2012-01-30 ENCOUNTER — Encounter (HOSPITAL_COMMUNITY): Payer: Self-pay | Admitting: *Deleted

## 2012-01-30 ENCOUNTER — Emergency Department (HOSPITAL_COMMUNITY): Payer: No Typology Code available for payment source

## 2012-01-30 ENCOUNTER — Other Ambulatory Visit: Payer: Self-pay

## 2012-01-30 ENCOUNTER — Observation Stay (HOSPITAL_COMMUNITY)
Admission: EM | Admit: 2012-01-30 | Discharge: 2012-01-31 | DRG: 313 | Disposition: A | Discharge status: Home / Self Care | Payer: No Typology Code available for payment source | Attending: Internal Medicine | Admitting: Internal Medicine

## 2012-01-30 DIAGNOSIS — F0390 Unspecified dementia without behavioral disturbance: Secondary | ICD-10-CM | POA: Diagnosis not present

## 2012-01-30 DIAGNOSIS — F068 Other specified mental disorders due to known physiological condition: Secondary | ICD-10-CM

## 2012-01-30 DIAGNOSIS — R0989 Other specified symptoms and signs involving the circulatory and respiratory systems: Secondary | ICD-10-CM | POA: Diagnosis present

## 2012-01-30 DIAGNOSIS — B369 Superficial mycosis, unspecified: Secondary | ICD-10-CM

## 2012-01-30 DIAGNOSIS — I10 Essential (primary) hypertension: Secondary | ICD-10-CM | POA: Insufficient documentation

## 2012-01-30 DIAGNOSIS — E559 Vitamin D deficiency, unspecified: Secondary | ICD-10-CM

## 2012-01-30 DIAGNOSIS — I2510 Atherosclerotic heart disease of native coronary artery without angina pectoris: Secondary | ICD-10-CM | POA: Insufficient documentation

## 2012-01-30 DIAGNOSIS — E669 Obesity, unspecified: Secondary | ICD-10-CM | POA: Insufficient documentation

## 2012-01-30 DIAGNOSIS — F17200 Nicotine dependence, unspecified, uncomplicated: Secondary | ICD-10-CM | POA: Insufficient documentation

## 2012-01-30 DIAGNOSIS — R0789 Other chest pain: Secondary | ICD-10-CM | POA: Diagnosis not present

## 2012-01-30 DIAGNOSIS — Z794 Long term (current) use of insulin: Secondary | ICD-10-CM | POA: Insufficient documentation

## 2012-01-30 DIAGNOSIS — E785 Hyperlipidemia, unspecified: Secondary | ICD-10-CM | POA: Diagnosis not present

## 2012-01-30 DIAGNOSIS — R5383 Other fatigue: Secondary | ICD-10-CM | POA: Diagnosis not present

## 2012-01-30 DIAGNOSIS — Z23 Encounter for immunization: Secondary | ICD-10-CM

## 2012-01-30 DIAGNOSIS — R7303 Prediabetes: Secondary | ICD-10-CM | POA: Diagnosis present

## 2012-01-30 DIAGNOSIS — R079 Chest pain, unspecified: Secondary | ICD-10-CM | POA: Diagnosis not present

## 2012-01-30 DIAGNOSIS — F329 Major depressive disorder, single episode, unspecified: Secondary | ICD-10-CM | POA: Insufficient documentation

## 2012-01-30 DIAGNOSIS — F3289 Other specified depressive episodes: Secondary | ICD-10-CM | POA: Insufficient documentation

## 2012-01-30 DIAGNOSIS — I25810 Atherosclerosis of coronary artery bypass graft(s) without angina pectoris: Secondary | ICD-10-CM | POA: Diagnosis not present

## 2012-01-30 DIAGNOSIS — R5381 Other malaise: Secondary | ICD-10-CM | POA: Diagnosis not present

## 2012-01-30 DIAGNOSIS — G5600 Carpal tunnel syndrome, unspecified upper limb: Secondary | ICD-10-CM

## 2012-01-30 DIAGNOSIS — E119 Type 2 diabetes mellitus without complications: Secondary | ICD-10-CM | POA: Insufficient documentation

## 2012-01-30 DIAGNOSIS — Z79899 Other long term (current) drug therapy: Secondary | ICD-10-CM | POA: Insufficient documentation

## 2012-01-30 HISTORY — DX: Unspecified dementia, unspecified severity, without behavioral disturbance, psychotic disturbance, mood disturbance, and anxiety: F03.90

## 2012-01-30 HISTORY — DX: Acute myocardial infarction, unspecified: I21.9

## 2012-01-30 HISTORY — DX: Polyp of colon: K63.5

## 2012-01-30 LAB — CBC
HCT: 39.8 % (ref 36.0–46.0)
Hemoglobin: 13.2 g/dL (ref 12.0–15.0)
RBC: 5.36 MIL/uL — ABNORMAL HIGH (ref 3.87–5.11)
RBC: 5.07 MIL/uL (ref 3.87–5.11)
RDW: 18.1 % — ABNORMAL HIGH (ref 11.5–15.5)
WBC: 6.8 10*3/uL (ref 4.0–10.5)
WBC: 6.3 10*3/uL (ref 4.0–10.5)

## 2012-01-30 LAB — BASIC METABOLIC PANEL
BUN: 17 mg/dL (ref 6–23)
Chloride: 104 mEq/L (ref 96–112)
GFR calc Af Amer: 87 mL/min — ABNORMAL LOW (ref >90)
Glucose, Bld: 90 mg/dL (ref 70–99)
Potassium: 4.3 mEq/L (ref 3.5–5.1)

## 2012-01-30 LAB — CREATININE, SERUM
GFR calc Af Amer: 88 mL/min — ABNORMAL LOW (ref >90)
GFR calc non Af Amer: 76 mL/min — ABNORMAL LOW (ref >90)

## 2012-01-30 LAB — DIFFERENTIAL
Lymphs Abs: 2.1 10*3/uL (ref 0.7–4.0)
Monocytes Relative: 8 % (ref 3–12)
Neutro Abs: 3.8 10*3/uL (ref 1.7–7.7)
Neutrophils Relative %: 57 % (ref 43–77)

## 2012-01-30 LAB — POCT I-STAT TROPONIN I

## 2012-01-30 LAB — PRO B NATRIURETIC PEPTIDE: Pro B Natriuretic peptide (BNP): 243.8 pg/mL (ref 0–450)

## 2012-01-30 LAB — CARDIAC PANEL(CRET KIN+CKTOT+MB+TROPI): CK, MB: 1.7 ng/mL (ref 0.3–4.0)

## 2012-01-30 MED ORDER — ALUM & MAG HYDROXIDE-SIMETH 200-200-20 MG/5ML PO SUSP: 30.0000 mL | Freq: Four times a day (QID) | ORAL | Status: DC | PRN

## 2012-01-30 MED ORDER — CELECOXIB 200 MG PO CAPS: 200.0000 mg | ORAL_CAPSULE | Freq: Every day | ORAL | Status: DC

## 2012-01-30 MED ORDER — NITROGLYCERIN 0.4 MG/SPRAY TL SOLN
1.0000 | Status: DC | PRN
  Filled 2012-01-30: qty 4.9

## 2012-01-30 MED ORDER — INSULIN ASPART 100 UNIT/ML ~~LOC~~ SOLN: 0.0000 [IU] | Freq: Three times a day (TID) | SUBCUTANEOUS | Status: DC

## 2012-01-30 MED ORDER — MORPHINE SULFATE 2 MG/ML IJ SOLN: 2.0000 mg | INTRAMUSCULAR | Status: DC | PRN

## 2012-01-30 MED ORDER — OXYCODONE HCL 5 MG PO TABS: 5.0000 mg | ORAL_TABLET | ORAL | Status: DC | PRN

## 2012-01-30 MED ORDER — LEVALBUTEROL HCL 0.63 MG/3ML IN NEBU
0.6300 mg | INHALATION_SOLUTION | Freq: Four times a day (QID) | RESPIRATORY_TRACT | Status: DC
  Administered 2012-01-30 – 2012-01-31 (×3): 0.63 mg via RESPIRATORY_TRACT
  Filled 2012-01-30 (×3): qty 3

## 2012-01-30 MED ORDER — ASPIRIN EC 325 MG PO TBEC
325.0000 mg | DELAYED_RELEASE_TABLET | Freq: Every day | ORAL | Status: DC
  Administered 2012-01-31: 325 mg via ORAL
  Filled 2012-01-30: qty 1

## 2012-01-30 MED ORDER — METOPROLOL TARTRATE 25 MG PO TABS
25.0000 mg | ORAL_TABLET | Freq: Two times a day (BID) | ORAL | Status: DC
  Administered 2012-01-30 – 2012-01-31 (×2): 25 mg via ORAL
  Filled 2012-01-30 (×2): qty 1

## 2012-01-30 MED ORDER — ONDANSETRON HCL 4 MG/2ML IJ SOLN: 4.0000 mg | Freq: Four times a day (QID) | INTRAMUSCULAR | Status: DC | PRN

## 2012-01-30 MED ORDER — ACETAMINOPHEN 650 MG RE SUPP: 650.0000 mg | Freq: Four times a day (QID) | RECTAL | Status: DC | PRN

## 2012-01-30 MED ORDER — DONEPEZIL HCL 5 MG PO TABS
10.0000 mg | ORAL_TABLET | Freq: Every day | ORAL | Status: DC
  Administered 2012-01-30: 10 mg via ORAL
  Filled 2012-01-30: qty 2

## 2012-01-30 MED ORDER — SODIUM CHLORIDE 0.9 % IJ SOLN
3.0000 mL | Freq: Two times a day (BID) | INTRAMUSCULAR | Status: DC
  Administered 2012-01-30: 3 mL via INTRAVENOUS
  Filled 2012-01-30 (×2): qty 3

## 2012-01-30 MED ORDER — ACETAMINOPHEN 325 MG PO TABS: 650.0000 mg | ORAL_TABLET | Freq: Four times a day (QID) | ORAL | Status: DC | PRN

## 2012-01-30 MED ORDER — POTASSIUM CHLORIDE CRYS ER 20 MEQ PO TBCR
20.0000 meq | EXTENDED_RELEASE_TABLET | Freq: Two times a day (BID) | ORAL | Status: AC
  Administered 2012-01-30 – 2012-01-31 (×2): 20 meq via ORAL
  Filled 2012-01-30 (×2): qty 1

## 2012-01-30 MED ORDER — ASPIRIN 81 MG PO CHEW
324.0000 mg | CHEWABLE_TABLET | Freq: Once | ORAL | Status: AC
  Administered 2012-01-30: 324 mg via ORAL
  Filled 2012-01-30: qty 4

## 2012-01-30 MED ORDER — INFLUENZA VAC TYPES A & B PF IM SUSP: 0.5000 mL | Freq: Once | INTRAMUSCULAR | Status: DC

## 2012-01-30 MED ORDER — FLUOXETINE HCL 20 MG PO CAPS
20.0000 mg | ORAL_CAPSULE | Freq: Every day | ORAL | Status: DC
Start: 2012-01-30 — End: 2012-01-31
  Administered 2012-01-30 – 2012-01-31 (×2): 20 mg via ORAL
  Filled 2012-01-30 (×2): qty 1

## 2012-01-30 MED ORDER — DOCUSATE SODIUM 100 MG PO CAPS
100.0000 mg | ORAL_CAPSULE | Freq: Two times a day (BID) | ORAL | Status: DC
  Administered 2012-01-30 – 2012-01-31 (×2): 100 mg via ORAL
  Filled 2012-01-30 (×2): qty 1

## 2012-01-30 MED ORDER — INSULIN ASPART 100 UNIT/ML ~~LOC~~ SOLN: 0.0000 [IU] | Freq: Every day | SUBCUTANEOUS | Status: DC

## 2012-01-30 MED ORDER — ISOSORBIDE MONONITRATE ER 60 MG PO TB24
30.0000 mg | ORAL_TABLET | Freq: Every day | ORAL | Status: DC
  Administered 2012-01-30 – 2012-01-31 (×2): 30 mg via ORAL
  Filled 2012-01-30 (×2): qty 1

## 2012-01-30 MED ORDER — ENOXAPARIN SODIUM 40 MG/0.4ML ~~LOC~~ SOLN
40.0000 mg | SUBCUTANEOUS | Status: DC
  Administered 2012-01-30: 40 mg via SUBCUTANEOUS
  Filled 2012-01-30: qty 0.4

## 2012-01-30 MED ORDER — ONDANSETRON HCL 4 MG PO TABS: 4.0000 mg | ORAL_TABLET | Freq: Four times a day (QID) | ORAL | Status: DC | PRN

## 2012-01-30 MED ORDER — ASPIRIN EC 325 MG PO TBEC: 325.0000 mg | DELAYED_RELEASE_TABLET | Freq: Every day | ORAL | Status: DC

## 2012-01-30 MED ORDER — FUROSEMIDE 10 MG/ML IJ SOLN
10.0000 mg | Freq: Two times a day (BID) | INTRAMUSCULAR | Status: DC
  Administered 2012-01-30 – 2012-01-31 (×2): 10 mg via INTRAVENOUS
  Filled 2012-01-30 (×2): qty 2

## 2012-01-30 MED ORDER — GUAIFENESIN-DM 100-10 MG/5ML PO SYRP: 5.0000 mL | ORAL_SOLUTION | ORAL | Status: DC | PRN

## 2012-01-30 MED ORDER — PANTOPRAZOLE SODIUM 40 MG PO TBEC
40.0000 mg | DELAYED_RELEASE_TABLET | Freq: Every day | ORAL | Status: DC
  Administered 2012-01-30 – 2012-01-31 (×2): 40 mg via ORAL
  Filled 2012-01-30 (×2): qty 1

## 2012-01-30 NOTE — ED Notes (Signed)
Pt pain free at present, pt updated on plan of care, room assignment,

## 2012-01-30 NOTE — H&P (Signed)
[name] MRN: [id number] DOB/AGE: 1926/02/16 78 y.o. Primary Care Physician:Margaret Lodema Hong, MD, MD Admit date: 01/30/2012 Chief Complaint: Chest pain.  HPI: The patient is an 77 year old woman with a past medical history significant for coronary artery disease, diabetes mellitus, and hypertension, who presented to the emergency department today with a chief complaint of chest pain. Her chest pain started at approximately 3 AM this morning. It is left-sided, located underneath her breasts. It is a pressure-like pain. She had some nausea and diaphoresis but no vomiting, no radiation, no shortness of breath when laying flat, and no pleurisy. She has had a nonproductive cough for the past several days. She has had intermittent swelling in her legs for which she takes Lasix. Today, she has no swelling in her legs. She has had no complaints of fever, chills, abdominal pain, diarrhea, constipation, or pain with urination. She took 3 aspirin this morning and felt better. She was worried and decided to come to the hospital.    Past Medical History  Diagnosis Date  . Diabetes mellitus   . Depression   . Hyperlipidemia   . Obesity   . Hypertension   . Coronary artery disease October 2005.    Cardiac cath revealed 70% circumflex stenosis. Treated medically. Ejection fraction 55-65%.  . Myocardial infarction October 2005  . Dementia   . Colon polyps     Past Surgical History  Procedure Date  . Vesicovaginal fistula closure w/ tah   . Abdominal hysterectomy   . Appendectomy     age 82  . Cardiac catheterization 2005    Prior to Admission medications   Medication Sig Start Date End Date Taking? Authorizing Provider  celecoxib (CELEBREX) 200 MG capsule Take 200 mg by mouth daily.   Yes Historical Provider, MD  donepezil (ARICEPT) 10 MG tablet Take 1 tablet (10 mg total) by mouth at bedtime. 07/11/11 07/10/12 Yes Kerri Perches, MD  FLUoxetine (PROZAC) 20 MG tablet Take 20 mg by mouth daily.  One tablet by mouth daily      Yes Historical Provider, MD  isosorbide mononitrate (IMDUR) 30 MG 24 hr tablet Take 30 mg by mouth daily. Take one tablet by mouth once a day    Yes Historical Provider, MD  LASIX 20 MG tablet TAKE 1 TABLET BY MOUTH DAILY AS NEEDED. 01/21/12   Kerri Perches, MD  LOPRESSOR 50 MG tablet TAKE 1/2 TABLET BY MOUTH EVERY DAY. 01/21/12   Kerri Perches, MD    Allergies: No Known Allergies  Family History  Problem Relation Age of Onset  . Cancer Mother     breast   . Heart attack Mother   . Alcohol abuse Father   . Heart attack Sister   . Diabetes Sister     Social History: she is widowed. She lives in [place] alone. She has no children. 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 She smokes 3-4 cigarettes daily. She denies alcohol use. She denies illicit drug use.          ROS: She has intermittent lower extremity edema. Otherwise as above history of present illness.  PHYSICAL EXAM: Blood pressure 164/70, pulse 83, temperature 98.4 F (36.9 C), temperature source Oral, resp. rate 20, height 5\' 4"  (1.626 m), weight 81.647 kg (180 lb), SpO2 95.00%.  General: This is a pleasant 78 year old obese African American woman who is lying in bed in no acute distress. HEENT: Head is normocephalic, nontraumatic. Pupils are equal, round, and reactive to  light. Extra movements are intact. Conjunctivae are clear. Sclerae are white. Oropharynx reveals multiple missing teeth. Mucous membranes are moist. No posterior exudates or erythema. Neck: Obese, supple, no adenopathy, no JVD, no bruit. Lungs: Occasional wheezes auscultated in the bases and rare crackles. Breathing is nonlabored. Heart: S1, S2, with a soft systolic murmur. Abdomen: Obese, positive bowel sounds, soft, nontender, nondistended. Extremities: Pedal pulses palpable. No pretibial edema and no pedal edema. Neurologic: She is alert and oriented x3. Cranial nerves II through XII are intact.  Basic  Metabolic Panel:  Basename 01/30/12 1459  NA 140  K 4.3  CL 104  CO2 27  GLUCOSE 90  BUN 17  CREATININE 0.78  CALCIUM 9.7  MG --  PHOS --   Liver Function Tests: No results found for this basename: AST:2,ALT:2,ALKPHOS:2,BILITOT:2,PROT:2,ALBUMIN:2 in the last 72 hours No results found for this basename: LIPASE:2,AMYLASE:2 in the last 72 hours No results found for this basename: AMMONIA:2 in the last 72 hours CBC:  Basename 01/30/12 1459  WBC 6.8  NEUTROABS 3.8  HGB 13.2  HCT 42.2  MCV 78.7  PLT 295   Cardiac Enzymes: No results found for this basename: CKTOTAL:3,CKMB:3,CKMBINDEX:3,TROPONINI:3 in the last 72 hours BNP:  Basename 01/30/12 1459  PROBNP 243.8   D-Dimer: No results found for this basename: DDIMER:2 in the last 72 hours CBG: No results found for this basename: GLUCAP:6 in the last 72 hours Hemoglobin A1C: No results found for this basename: HGBA1C in the last 72 hours Fasting Lipid Panel: No results found for this basename: CHOL,HDL,LDLCALC,TRIG,CHOLHDL,LDLDIRECT in the last 72 hours Thyroid Function Tests: No results found for this basename: TSH,T4TOTAL,FREET4,T3FREE,THYROIDAB in the last 72 hours Anemia Panel: No results found for this basename: VITAMINB12,FOLATE,FERRITIN,TIBC,IRON,RETICCTPCT in the last 72 hours Coagulation: No results found for this basename: LABPROT:2,INR:2 in the last 72 hours Urine Drug Screen: Drugs of Abuse  No results found for this basename: labopia, cocainscrnur, labbenz, amphetmu, thcu, labbarb    Alcohol Level: No results found for this basename: ETH:2 in the last 72 hours Urinalysis: No results found for this basename: COLORURINE:2,APPERANCEUR:2,LABSPEC:2,PHURINE:2,GLUCOSEU:2,HGBUR:2,BILIRUBINUR:2,KETONESUR:2,PROTEINUR:2,UROBILINOGEN:2,NITRITE:2,LEUKOCYTESUR:2 in the last 72 hours Misc. Labs:   EKG: Normal sinus rhythm with a heart rate of 78 beats per minute. No significant ST or T wave abnormalities.   No  results found for this or any previous visit (from the past 240 hour(s)).   Results for orders placed during the hospital encounter of 01/30/12 (from the past 48 hour(s))  POCT I-STAT TROPONIN I     Status: Normal   Collection Time   01/30/12  2:55 PM      Component Value Range Comment   Troponin i, poc 0.00  0.00 - 0.08 (ng/mL)    Comment 3            CBC     Status: Abnormal   Collection Time   01/30/12  2:59 PM      Component Value Range Comment   WBC 6.8  4.0 - 10.5 (K/uL)    RBC 5.36 (*) 3.87 - 5.11 (MIL/uL)    Hemoglobin 13.2  12.0 - 15.0 (g/dL)    HCT 29.5  62.1 - 30.8 (%)    MCV 78.7  78.0 - 100.0 (fL)    MCH 24.6 (*) 26.0 - 34.0 (pg)    MCHC 31.3  30.0 - 36.0 (g/dL)    RDW 65.7 (*) 84.6 - 15.5 (%)    Platelets 295  150 - 400 (K/uL)   DIFFERENTIAL  Status: Normal   Collection Time   01/30/12  2:59 PM      Component Value Range Comment   Neutrophils Relative 57  43 - 77 (%)    Neutro Abs 3.8  1.7 - 7.7 (K/uL)    Lymphocytes Relative 30  12 - 46 (%)    Lymphs Abs 2.1  0.7 - 4.0 (K/uL)    Monocytes Relative 8  3 - 12 (%)    Monocytes Absolute 0.6  0.1 - 1.0 (K/uL)    Eosinophils Relative 5  0 - 5 (%)    Eosinophils Absolute 0.3  0.0 - 0.7 (K/uL)    Basophils Relative 0  0 - 1 (%)    Basophils Absolute 0.0  0.0 - 0.1 (K/uL)   BASIC METABOLIC PANEL     Status: Abnormal   Collection Time   01/30/12  2:59 PM      Component Value Range Comment   Sodium 140  135 - 145 (mEq/L)    Potassium 4.3  3.5 - 5.1 (mEq/L)    Chloride 104  96 - 112 (mEq/L)    CO2 27  19 - 32 (mEq/L)    Glucose, Bld 90  70 - 99 (mg/dL)    BUN 17  6 - 23 (mg/dL)    Creatinine, Ser 1.61  0.50 - 1.10 (mg/dL)    Calcium 9.7  8.4 - 10.5 (mg/dL)    GFR calc non Af Amer 75 (*) >90 (mL/min)    GFR calc Af Amer 87 (*) >90 (mL/min)   PRO B NATRIURETIC PEPTIDE     Status: Normal   Collection Time   01/30/12  2:59 PM      Component Value Range Comment   Pro B Natriuretic peptide (BNP) 243.8  0 - 450 (pg/mL)      Dg Chest Portable 1 View  01/30/2012  *RADIOLOGY REPORT*  Clinical Data: Chest pain  PORTABLE CHEST - 1 VIEW  Comparison: 07/11/2011  Findings: Cardiomegaly is noted.  There is central mild vascular congestion , mild perihilar interstitial prominence and hazy airspace disease left greater than right suspicious for early edema or pneumonitis.  Elevation of the right hemidiaphragm again noted. Atherosclerotic calcifications of thoracic aorta.  IMPRESSION: There is central mild vascular congestion , mild perihilar interstitial prominence and hazy airspace disease left greater than right suspicious for early edema or pneumonitis.  Original Report Authenticated By: Natasha Mead, M.D.    Impression:  Principal Problem:  *Chest pain Active Problems:  DIABETES MELLITUS  HYPERLIPIDEMIA  OBESITY  DEMENTIA  DEPRESSION  Pulmonary vascular congestion  Coronary artery disease    1. Chest pain in a patient with coronary artery disease. She is currently chest pain-free.   2. Type 2 diabetes mellitus. Currently well-controlled.  3. Hypertension. She is treated chronically with metoprolol.  4. Mild vascular congestion. This may be a consequence of congestive heart failure, however, her pro BNP is only 244. In 2005, her ejection fraction was within normal limits per cardiac catheterization.  5. Mild tobacco abuse. The patient was advised to stop altogether.  6. Hyperlipidemia. She is not on a statin per medication history.   Plan:  1. We'll admit the patient for 24-hour observation. 2. We'll treat her pain with as needed sublingual nitroglycerin and as needed morphine. We'll continue him to her. 3. We'll continue metoprolol and aspirin. 4. Will give her gentle IV Lasix over the next 24 hours. We'll also add bronchodilator therapy with Xopenex. She was advised to  stop smoking. 5. For further evaluation, we'll check cardiac enzymes, 2-D echocardiogram, TSH, hemoglobin A1c, etc. We'll also check a  fasting lipid panel in the morning.      Nishka Heide 01/30/2012, 7:21 PM

## 2012-01-30 NOTE — ED Notes (Signed)
Pt given dinner tray.

## 2012-01-30 NOTE — ED Notes (Signed)
Pt presents to er with left side chest pain and bilateral hand numbness that woke pt up from sleeping at 3:00 this am, pt denies any sob, n/v or diaphoresis. Dr. Ethelda Chick in room with pt upon pt arrival to er.

## 2012-01-30 NOTE — ED Notes (Signed)
Report given to [place], [place] on 300

## 2012-01-30 NOTE — ED Provider Notes (Signed)
History     CSN: [id number]  Arrival date & time 01/30/12  1437   First MD Initiated Contact with Patient 01/30/12 1458      Chief Complaint  Patient presents with  . Chest Pain    (Consider location/radiation/quality/duration/timing/severity/associated sxs/prior treatment) HPI Complains of anterior chest pain onset 3 AM today resolved upon arrival here. No treatment prior to coming here pain is left-sided, anterior, nonradiating vague in quality. Reports has never had similar discomfort before nothing makes symptoms better or worse patient presently asymptomatic no treatment prior to coming here Past Medical History  Diagnosis Date  . Diabetes mellitus   . Depression   . Hyperlipidemia   . Obesity   . Hypertension    coronary artery disease  Past Surgical History  Procedure Date  . Vesicovaginal fistula closure w/ tah   . Abdominal hysterectomy   . Appendectomy     age 78    Family History  Problem Relation Age of Onset  . Cancer Mother     breast   . Heart attack Mother   . Alcohol abuse Father   . Heart attack Sister   . Diabetes Sister     History  Substance Use Topics  . Smoking status: Current Some Day Smoker -- 0.2 packs/day    Types: Cigarettes  . Smokeless tobacco: Not on file   Comment: 1 or 2 cigs a day  . Alcohol Use: No    OB History    Grav Para Term Preterm Abortions TAB SAB Ect Mult Living                  Review of Systems  Constitutional: Negative.   HENT: Negative.   Respiratory: Negative.   Cardiovascular: Positive for chest pain.  Gastrointestinal: Negative.   Musculoskeletal: Negative.   Skin: Negative.   Neurological: Negative.   Hematological: Negative.   Psychiatric/Behavioral: Negative.   All other systems reviewed and are negative.    Allergies  Review of patient's allergies indicates no known allergies.  Home Medications   Current Outpatient Rx  Name Route Sig Dispense Refill  . DONEPEZIL HCL 10 MG PO TABS  Oral Take 1 tablet (10 mg total) by mouth at bedtime. 30 tablet 11    Dose increase effective 07/11/2011  . FLUOXETINE HCL 20 MG PO TABS Oral Take 20 mg by mouth daily. One tablet by mouth daily       . ISOSORBIDE MONONITRATE ER 30 MG PO TB24 Oral Take 30 mg by mouth daily. Take one tablet by mouth once a day     . LASIX 20 MG PO TABS  TAKE 1 TABLET BY MOUTH DAILY AS NEEDED. 30 each 3  . LOPRESSOR 50 MG PO TABS  TAKE 1/2 TABLET BY MOUTH EVERY DAY. 15 each 3    BP 194/80  Pulse 74  Temp(Src) 99.2 F (37.3 C) (Oral)  Resp 20  Ht 5\' 4"  (1.626 m)  Wt 180 lb (81.647 kg)  BMI 30.90 kg/m2  SpO2 96%  Physical Exam  Nursing note and vitals reviewed. Constitutional: She appears well-developed and well-nourished.  HENT:  Head: Normocephalic and atraumatic.  Eyes: Conjunctivae are normal. Pupils are equal, round, and reactive to light.  Neck: Neck supple. No tracheal deviation present. No thyromegaly present.  Cardiovascular: Normal rate and regular rhythm.   No murmur heard. Pulmonary/Chest: Effort normal and breath sounds normal.  Abdominal: Soft. Bowel sounds are normal. She exhibits no distension. There is no tenderness.  Obese  Musculoskeletal: Normal range of motion. She exhibits no edema and no tenderness.  Neurological: She is alert. Coordination normal.  Skin: Skin is warm and dry. No rash noted.  Psychiatric: She has a normal mood and affect.   Date: 01/30/2012  Rate: 78  Rhythm: normal sinus rhythm  QRS Axis: normal  Intervals: normal  ST/T Wave abnormalities: normal  Conduction Disutrbances: none  Narrative Interpretation: unremarkable     ED Course  Procedures (including critical care time) 4:40 PM patient remains asymptomatic. Spoke with Dr.Hochrein; okay to keep patient here to rule out acute coronary syndrome. Patient prefers to stay in regional. Spoke with Dr. Sherrie Mustache who agrees for 23-hour observation telemetry  Labs Reviewed  CBC  DIFFERENTIAL  BASIC  METABOLIC PANEL  TROPONIN I   No results found. Results for orders placed during the hospital encounter of 01/30/12  CBC      Component Value Range   WBC 6.8  4.0 - 10.5 (K/uL)   RBC 5.36 (*) 3.87 - 5.11 (MIL/uL)   Hemoglobin 13.2  12.0 - 15.0 (g/dL)   HCT 81.1  91.4 - 78.2 (%)   MCV 78.7  78.0 - 100.0 (fL)   MCH 24.6 (*) 26.0 - 34.0 (pg)   MCHC 31.3  30.0 - 36.0 (g/dL)   RDW 95.6 (*) 21.3 - 15.5 (%)   Platelets 295  150 - 400 (K/uL)  DIFFERENTIAL      Component Value Range   Neutrophils Relative 57  43 - 77 (%)   Neutro Abs 3.8  1.7 - 7.7 (K/uL)   Lymphocytes Relative 30  12 - 46 (%)   Lymphs Abs 2.1  0.7 - 4.0 (K/uL)   Monocytes Relative 8  3 - 12 (%)   Monocytes Absolute 0.6  0.1 - 1.0 (K/uL)   Eosinophils Relative 5  0 - 5 (%)   Eosinophils Absolute 0.3  0.0 - 0.7 (K/uL)   Basophils Relative 0  0 - 1 (%)   Basophils Absolute 0.0  0.0 - 0.1 (K/uL)  BASIC METABOLIC PANEL      Component Value Range   Sodium 140  135 - 145 (mEq/L)   Potassium 4.3  3.5 - 5.1 (mEq/L)   Chloride 104  96 - 112 (mEq/L)   CO2 27  19 - 32 (mEq/L)   Glucose, Bld 90  70 - 99 (mg/dL)   BUN 17  6 - 23 (mg/dL)   Creatinine, Ser 0.86  0.50 - 1.10 (mg/dL)   Calcium 9.7  8.4 - 57.8 (mg/dL)   GFR calc non Af Amer 75 (*) >90 (mL/min)   GFR calc Af Amer 87 (*) >90 (mL/min)  POCT I-STAT TROPONIN I      Component Value Range   Troponin i, poc 0.00  0.00 - 0.08 (ng/mL)   Comment 3            Dg Chest Portable 1 View  01/30/2012  *RADIOLOGY REPORT*  Clinical Data: Chest pain  PORTABLE CHEST - 1 VIEW  Comparison: 07/11/2011  Findings: Cardiomegaly is noted.  There is central mild vascular congestion , mild perihilar interstitial prominence and hazy airspace disease left greater than right suspicious for early edema or pneumonitis.  Elevation of the right hemidiaphragm again noted. Atherosclerotic calcifications of thoracic aorta.  IMPRESSION: There is central mild vascular congestion , mild perihilar  interstitial prominence and hazy airspace disease left greater than right suspicious for early edema or pneumonitis.  Original Report Authenticated By: Natasha Mead, M.D.  No diagnosis found.    MDM  Plan telemetry 23 hour observation Diagnosis chest pain       Doug Sou, MD 01/30/12 [phone]

## 2012-01-30 NOTE — ED Notes (Addendum)
Lt chest pain onset 3 am.No nausea , no sweats.

## 2012-01-31 DIAGNOSIS — E785 Hyperlipidemia, unspecified: Secondary | ICD-10-CM | POA: Diagnosis not present

## 2012-01-31 DIAGNOSIS — I10 Essential (primary) hypertension: Secondary | ICD-10-CM | POA: Diagnosis not present

## 2012-01-31 DIAGNOSIS — R079 Chest pain, unspecified: Secondary | ICD-10-CM | POA: Diagnosis not present

## 2012-01-31 DIAGNOSIS — I25810 Atherosclerosis of coronary artery bypass graft(s) without angina pectoris: Secondary | ICD-10-CM | POA: Diagnosis not present

## 2012-01-31 LAB — CBC
Hemoglobin: 11.5 g/dL — ABNORMAL LOW (ref 12.0–15.0)
MCH: 24.6 pg — ABNORMAL LOW (ref 26.0–34.0)
MCV: 78.4 fL (ref 78.0–100.0)
RBC: 4.67 MIL/uL (ref 3.87–5.11)

## 2012-01-31 LAB — LIPID PANEL
HDL: 45 mg/dL (ref >39)
LDL Cholesterol: 130 mg/dL — ABNORMAL HIGH (ref 0–99)
Total CHOL/HDL Ratio: 4.2 RATIO
VLDL: 15 mg/dL (ref 0–40)

## 2012-01-31 LAB — CARDIAC PANEL(CRET KIN+CKTOT+MB+TROPI)
CK, MB: 1.8 ng/mL (ref 0.3–4.0)
CK, MB: 2.1 ng/mL (ref 0.3–4.0)
Troponin I: <0.3 ng/mL (ref <0.30)

## 2012-01-31 LAB — TSH: TSH: 0.947 u[IU]/mL (ref 0.350–4.500)

## 2012-01-31 LAB — HEMOGLOBIN A1C
Hgb A1c MFr Bld: 6 % — ABNORMAL HIGH (ref <5.7)
Mean Plasma Glucose: 126 mg/dL — ABNORMAL HIGH (ref <117)

## 2012-01-31 LAB — COMPREHENSIVE METABOLIC PANEL
AST: 12 U/L (ref 0–37)
Albumin: 3.1 g/dL — ABNORMAL LOW (ref 3.5–5.2)
Calcium: 8.9 mg/dL (ref 8.4–10.5)
Chloride: 104 mEq/L (ref 96–112)
Creatinine, Ser: 0.8 mg/dL (ref 0.50–1.10)

## 2012-01-31 LAB — GLUCOSE, CAPILLARY
Glucose-Capillary: 92 mg/dL (ref 70–99)
Glucose-Capillary: 105 mg/dL — ABNORMAL HIGH (ref 70–99)

## 2012-01-31 MED ORDER — LEVALBUTEROL TARTRATE 45 MCG/ACT IN AERO: 1.0000 | INHALATION_SPRAY | Freq: Four times a day (QID) | RESPIRATORY_TRACT | Status: DC | PRN

## 2012-01-31 MED ORDER — HYDROCHLOROTHIAZIDE 12.5 MG PO TABS: 12.5000 mg | ORAL_TABLET | Freq: Every day | ORAL | Status: DC

## 2012-01-31 MED ORDER — ATORVASTATIN CALCIUM 10 MG PO TABS: 10.0000 mg | ORAL_TABLET | Freq: Every day | ORAL | Status: DC

## 2012-01-31 MED ORDER — NITROGLYCERIN 0.3 MG SL SUBL: 0.3000 mg | SUBLINGUAL_TABLET | SUBLINGUAL | Status: DC | PRN

## 2012-01-31 MED ORDER — IRBESARTAN 75 MG PO TABS
37.5000 mg | ORAL_TABLET | Freq: Every day | ORAL | Status: DC
  Administered 2012-01-31: 37.5 mg via ORAL
  Filled 2012-01-31: qty 1

## 2012-01-31 MED ORDER — ASPIRIN EC 81 MG PO TBEC: 81.0000 mg | DELAYED_RELEASE_TABLET | Freq: Every day | ORAL | Status: AC

## 2012-01-31 MED ORDER — LEVALBUTEROL TARTRATE 45 MCG/ACT IN AERO
1.0000 | INHALATION_SPRAY | Freq: Four times a day (QID) | RESPIRATORY_TRACT | Status: DC
  Administered 2012-01-31: 2 via RESPIRATORY_TRACT
  Filled 2012-01-31: qty 15

## 2012-01-31 MED ORDER — IRBESARTAN 75 MG PO TABS: 75.0000 mg | ORAL_TABLET | Freq: Every day | ORAL | Status: DC

## 2012-01-31 NOTE — Progress Notes (Signed)
CARE MANAGEMENT NOTE 01/31/2012  Patient:  [name], [name]   Account Number:  [id number]  Date Initiated:  01/31/2012  Documentation initiated by:  Rosemary Holms  Subjective/Objective Assessment:   Pt admitted with chest pain. PTA lived at home alone with close family support.     Action/Plan:   Plan to DC home with [facility] RN to assist with medication administration and understanding.   Anticipated DC Date:  01/31/2012   Anticipated DC Plan:  HOME W HOME HEALTH SERVICES      DC Planning Services  CM consult      Choice offered to / List presented to:          [facility] arranged  HH-1 RN  HH-10 DISEASE MANAGEMENT      HH agency  [facility]   Status of service:  Completed, signed off Medicare Important Message given?   (If response is "NO", the following Medicare IM given date fields will be blank) Date Medicare IM given:   Date Additional Medicare IM given:    Discharge Disposition:  HOME W HOME HEALTH SERVICES  Per UR Regulation:    If discussed at Long Length of Stay Meetings, dates discussed:    Comments:  01/31/12 1130 Marguita Venning Leanord Hawking RN BSN

## 2012-01-31 NOTE — Progress Notes (Signed)
UR Chart Review Completed  

## 2012-01-31 NOTE — Progress Notes (Signed)
Writer discussed and reviewed discharge medications and instructions, verbalized understanding.  Encouraged to call with any questions that may arise after leaving.  Pt took all belongings and wheel chaired out in stable condition and in no distress via staff member.   Encouraged to make follow up appt on Monday and its importance, verbalized understanding.   HH RN has been set up and pt aware.

## 2012-01-31 NOTE — Discharge Summary (Signed)
Physician Discharge Summary  [name] MRN: [id number] DOB/AGE: 1926-06-11 78 y.o.  PCP: Syliva Overman, MD, MD   Admit date: 01/30/2012 Discharge date: 01/31/2012  Discharge Diagnoses:  1. Chest pain, MI ruled out. History of CAD. The patient would benefit from an outpatient referral to cardiology. 2. Pulmonary vascular congestion. 3. Hyperlipidemia. The patient's total cholesterol was 190, triglycerides 75, HDL cholesterol 45, LDL cholesterol 130. 4. Hypertension, relatively uncontrolled. 5. Type 2 diabetes mellitus. Diet control. 6. Early dementia. 7. Depression. 8. Obesity.    Medication List  As of 01/31/2012 11:53 AM   TAKE these medications         aspirin EC 81 MG tablet   Take 1 tablet (81 mg total) by mouth daily.      atorvastatin 10 MG tablet   Commonly known as: LIPITOR   Take 1 tablet (10 mg total) by mouth at bedtime. New medication for high cholesterol.      celecoxib 200 MG capsule   Commonly known as: CELEBREX   Take 200 mg by mouth daily.      donepezil 10 MG tablet   Commonly known as: ARICEPT   Take 1 tablet (10 mg total) by mouth at bedtime.      FLUoxetine 20 MG tablet   Commonly known as: PROZAC   Take 20 mg by mouth daily. One tablet by mouth daily         hydrochlorothiazide 12.5 MG tablet   Commonly known as: HYDRODIURIL   Take 1 tablet (12.5 mg total) by mouth daily. New medication for high blood pressure and fluid retention.      isosorbide mononitrate 30 MG 24 hr tablet   Commonly known as: IMDUR   Take 30 mg by mouth daily. Take one tablet by mouth once a day      LASIX 20 MG tablet   Generic drug: furosemide   TAKE 1 TABLET BY MOUTH DAILY AS NEEDED.      levalbuterol 45 MCG/ACT inhaler   Commonly known as: XOPENEX HFA   Inhale 1-2 puffs into the lungs every 6 (six) hours as needed for wheezing or shortness of breath.      LOPRESSOR 50 MG tablet   Generic drug: metoprolol   TAKE 1/2 TABLET BY MOUTH EVERY DAY.      nitroGLYCERIN 0.3 MG SL tablet   Commonly known as: NITROSTAT   Place 1 tablet (0.3 mg total) under the tongue every 5 (five) minutes as needed for chest pain.            Discharge Condition: Improved and stable.  Disposition: Home.    Consults: None.   Significant Diagnostic Studies: Dg Chest Portable 1 View  01/30/2012  *RADIOLOGY REPORT*  Clinical Data: Chest pain  PORTABLE CHEST - 1 VIEW  Comparison: 07/11/2011  Findings: Cardiomegaly is noted.  There is central mild vascular congestion , mild perihilar interstitial prominence and hazy airspace disease left greater than right suspicious for early edema or pneumonitis.  Elevation of the right hemidiaphragm again noted. Atherosclerotic calcifications of thoracic aorta.  IMPRESSION: There is central mild vascular congestion , mild perihilar interstitial prominence and hazy airspace disease left greater than right suspicious for early edema or pneumonitis.  Original Report Authenticated By: Natasha Mead, M.D.   ECHO: Ordered but not done due to the holiday.   Microbiology: No results found for this or any previous visit (from the past 240 hour(s)).   Labs: Results for orders placed during the  hospital encounter of 01/30/12 (from the past 48 hour(s))  POCT I-STAT TROPONIN I     Status: Normal   Collection Time   01/30/12  2:55 PM      Component Value Range Comment   Troponin i, poc 0.00  0.00 - 0.08 (ng/mL)    Comment 3            CBC     Status: Abnormal   Collection Time   01/30/12  2:59 PM      Component Value Range Comment   WBC 6.8  4.0 - 10.5 (K/uL)    RBC 5.36 (*) 3.87 - 5.11 (MIL/uL)    Hemoglobin 13.2  12.0 - 15.0 (g/dL)    HCT 21.3  08.6 - 57.8 (%)    MCV 78.7  78.0 - 100.0 (fL)    MCH 24.6 (*) 26.0 - 34.0 (pg)    MCHC 31.3  30.0 - 36.0 (g/dL)    RDW 46.9 (*) 62.9 - 15.5 (%)    Platelets 295  150 - 400 (K/uL)   DIFFERENTIAL     Status: Normal   Collection Time   01/30/12  2:59 PM      Component Value Range  Comment   Neutrophils Relative 57  43 - 77 (%)    Neutro Abs 3.8  1.7 - 7.7 (K/uL)    Lymphocytes Relative 30  12 - 46 (%)    Lymphs Abs 2.1  0.7 - 4.0 (K/uL)    Monocytes Relative 8  3 - 12 (%)    Monocytes Absolute 0.6  0.1 - 1.0 (K/uL)    Eosinophils Relative 5  0 - 5 (%)    Eosinophils Absolute 0.3  0.0 - 0.7 (K/uL)    Basophils Relative 0  0 - 1 (%)    Basophils Absolute 0.0  0.0 - 0.1 (K/uL)   BASIC METABOLIC PANEL     Status: Abnormal   Collection Time   01/30/12  2:59 PM      Component Value Range Comment   Sodium 140  135 - 145 (mEq/L)    Potassium 4.3  3.5 - 5.1 (mEq/L)    Chloride 104  96 - 112 (mEq/L)    CO2 27  19 - 32 (mEq/L)    Glucose, Bld 90  70 - 99 (mg/dL)    BUN 17  6 - 23 (mg/dL)    Creatinine, Ser 5.28  0.50 - 1.10 (mg/dL)    Calcium 9.7  8.4 - 10.5 (mg/dL)    GFR calc non Af Amer 75 (*) >90 (mL/min)    GFR calc Af Amer 87 (*) >90 (mL/min)   PRO B NATRIURETIC PEPTIDE     Status: Normal   Collection Time   01/30/12  2:59 PM      Component Value Range Comment   Pro B Natriuretic peptide (BNP) 243.8  0 - 450 (pg/mL)   CBC     Status: Abnormal   Collection Time   01/30/12  7:52 PM      Component Value Range Comment   WBC 6.3  4.0 - 10.5 (K/uL)    RBC 5.07  3.87 - 5.11 (MIL/uL)    Hemoglobin 12.6  12.0 - 15.0 (g/dL)    HCT 41.3  24.4 - 01.0 (%)    MCV 78.5  78.0 - 100.0 (fL)    MCH 24.9 (*) 26.0 - 34.0 (pg)    MCHC 31.7  30.0 - 36.0 (g/dL)    RDW 27.2 (*)  11.5 - 15.5 (%)    Platelets 263  150 - 400 (K/uL)   CREATININE, SERUM     Status: Abnormal   Collection Time   01/30/12  7:52 PM      Component Value Range Comment   Creatinine, Ser 0.73  0.50 - 1.10 (mg/dL)    GFR calc non Af Amer 76 (*) >90 (mL/min)    GFR calc Af Amer 88 (*) >90 (mL/min)   CARDIAC PANEL(CRET KIN+CKTOT+MB+TROPI)     Status: Normal   Collection Time   01/30/12  7:52 PM      Component Value Range Comment   Total CK 62  7 - 177 (U/L)    CK, MB 1.7  0.3 - 4.0 (ng/mL)    Troponin I  <0.30  <0.30 (ng/mL)    Relative Index RELATIVE INDEX IS INVALID  0.0 - 2.5    GLUCOSE, CAPILLARY     Status: Abnormal   Collection Time   01/30/12  9:55 PM      Component Value Range Comment   Glucose-Capillary 134 (*) 70 - 99 (mg/dL)   CARDIAC PANEL(CRET KIN+CKTOT+MB+TROPI)     Status: Normal   Collection Time   01/31/12  3:02 AM      Component Value Range Comment   Total CK 65  7 - 177 (U/L)    CK, MB 1.8  0.3 - 4.0 (ng/mL)    Troponin I <0.30  <0.30 (ng/mL)    Relative Index RELATIVE INDEX IS INVALID  0.0 - 2.5    LIPID PANEL     Status: Abnormal   Collection Time   01/31/12  3:03 AM      Component Value Range Comment   Cholesterol 190  0 - 200 (mg/dL)    Triglycerides 75  <161 (mg/dL)    HDL 45  >09 (mg/dL)    Total CHOL/HDL Ratio 4.2      VLDL 15  0 - 40 (mg/dL)    LDL Cholesterol 604 (*) 0 - 99 (mg/dL)   COMPREHENSIVE METABOLIC PANEL     Status: Abnormal   Collection Time   01/31/12  3:12 AM      Component Value Range Comment   Sodium 139  135 - 145 (mEq/L)    Potassium 4.1  3.5 - 5.1 (mEq/L)    Chloride 104  96 - 112 (mEq/L)    CO2 27  19 - 32 (mEq/L)    Glucose, Bld 98  70 - 99 (mg/dL)    BUN 16  6 - 23 (mg/dL)    Creatinine, Ser 5.40  0.50 - 1.10 (mg/dL)    Calcium 8.9  8.4 - 10.5 (mg/dL)    Total Protein 6.6  6.0 - 8.3 (g/dL)    Albumin 3.1 (*) 3.5 - 5.2 (g/dL)    AST 12  0 - 37 (U/L)    ALT 6  0 - 35 (U/L)    Alkaline Phosphatase 76  39 - 117 (U/L)    Total Bilirubin 0.4  0.3 - 1.2 (mg/dL)    GFR calc non Af Amer 66 (*) >90 (mL/min)    GFR calc Af Amer 76 (*) >90 (mL/min)   CBC     Status: Abnormal   Collection Time   01/31/12  3:12 AM      Component Value Range Comment   WBC 8.1  4.0 - 10.5 (K/uL)    RBC 4.67  3.87 - 5.11 (MIL/uL)    Hemoglobin 11.5 (*) 12.0 - 15.0 (  g/dL)    HCT 98.1  19.1 - 47.8 (%)    MCV 78.4  78.0 - 100.0 (fL)    MCH 24.6 (*) 26.0 - 34.0 (pg)    MCHC 31.4  30.0 - 36.0 (g/dL)    RDW 29.5 (*) 62.1 - 15.5 (%)    Platelets 269  150 -  400 (K/uL)   GLUCOSE, CAPILLARY     Status: Normal   Collection Time   01/31/12  7:55 AM      Component Value Range Comment   Glucose-Capillary 92  70 - 99 (mg/dL)   CARDIAC PANEL(CRET KIN+CKTOT+MB+TROPI)     Status: Normal   Collection Time   01/31/12 10:15 AM      Component Value Range Comment   Total CK 82  7 - 177 (U/L)    CK, MB 2.1  0.3 - 4.0 (ng/mL)    Troponin I <0.30  <0.30 (ng/mL)    Relative Index RELATIVE INDEX IS INVALID  0.0 - 2.5    GLUCOSE, CAPILLARY     Status: Abnormal   Collection Time   01/31/12 11:07 AM      Component Value Range Comment   Glucose-Capillary 105 (*) 70 - 99 (mg/dL)      HPI : The patient is an 78 year old woman with a past medical history significant for coronary artery disease, diabetes mellitus, and hypertension, who presented to the emergency department on March 28th 2013 with a chief complaint of chest pain. In the emergency department, she was noted to be hypertensive with a blood pressure 164-70, afebrile, and otherwise hemodynamically stable. Her lab data were unremarkable. Her EKG revealed normal sinus rhythm with a heart rate of 78 beats per minute and no ST or T wave abnormalities. Her chest x-ray revealed pulmonary vascular congestion. She was admitted for further evaluation and management.  HOSPITAL COURSE: The patient was continued on all of her chronic medications. Sliding scale NovoLog was started for treatment of diabetes, however, the patient's capillary blood glucose was virtually normal. Gentle IV Lasix was started for 24 hours for mild pulmonary vascular congestion. Sublingual nitroglycerin was ordered for chest pain as needed. She was continued on Imdur daily. She was continued on metoprolol for hypertension. She was advised to stop smoking altogether even though she smoked only 2-3 cigarettes daily. Xopenex nebulizer was ordered for occasional bronchospasms auscultated on exam.  For further evaluation, a number of studies were ordered.  Her pro BNP was only 244. All of her cardiac enzymes were well within normal limits and therefore she ruled out for myocardial infarction. The results of her fasting lipid profile were dictated above. She was started on Lipitor at the time of discharge. Her free T 4 and TSH results were pending at the time of discharge. A 2-D echocardiogram was ordered, however, because of the holiday, it was not done.  The patient improved clinically and symptomatically. She was chest pain-free at the time of discharge. The etiology of her chest pain was unclear. However, it resolved. Given her history of coronary artery disease, she would benefit from an outpatient referral to lobe our cardiology. This decision will be deferred to Dr. Lodema Hong, her primary care physician. Next  Of note, because of the patient's malignant hypertension, Avapro was started but was eventually discontinued. It was decided that she would be maintained on metoprolol 25 mg daily but hydrochlorothiazide was added for better blood pressure control. This would also help with her intermittent lower extremity edema. A home health registered  nurse will make a home visit to review the patient's medication compliance and understanding. It appears that she does have the early stages of dementia and sometimes becomes confused about her medications. Her 2 cousins were in the hospital room with her when I discussed the medication changes. They said that they would help her at home as needed.      Discharge Exam: Blood pressure 169/69, pulse 60, temperature 98.2 F (36.8 C), temperature source Oral, resp. rate 18, height 5\' 4"  (1.626 m), weight 81.647 kg (180 lb), SpO2 95.00%.  Lungs: Clear to auscultation bilaterally. Heart: S1, S2, with a soft systolic murmur. Extremities: No pedal edema. Neurologic: She is alert and oriented x2. Cranial nerves II through XII are intact.   Discharge Orders    Future Appointments: Provider: Department: Dept Phone:  Center:   03/19/2012 1:15 PM Kerri Perches, MD [facility] [phone] [facility]     Future Orders Please Complete By Expires   Diet - low sodium heart healthy      Increase activity slowly      Discharge instructions      Comments:   YOU ARE ON A NEW MEDICATION FOR FLUID RETENTION AND HIGH BLOOD PRESSURE CALLED HYDROCHLOROTHIAZIDE. YOU ARE ON A NEW MEDICATION FOR YOUR HIGH CHOLESTEROL LEVEL CALLED ATORVASTATIN (LIPITOR). STOP SMOKING ALTOGETHER. CALL TO FOLLOWUP WITH DR. Lodema Hong NEXT WEEK.      Follow-up Information    Follow up with Syliva Overman, MD. Schedule an appointment as soon as possible for a visit in 5 days.   Contact information:   [address], [address] [place] [address] [phone]          Total discharge time: 35 minutes.   Signed: Ashey Tramontana 01/31/2012, 11:53 AM

## 2012-02-03 DIAGNOSIS — I5189 Other ill-defined heart diseases: Secondary | ICD-10-CM

## 2012-02-03 HISTORY — DX: Other ill-defined heart diseases: I51.89

## 2012-02-04 ENCOUNTER — Encounter: Payer: Self-pay | Admitting: Family Medicine

## 2012-02-04 ENCOUNTER — Ambulatory Visit (INDEPENDENT_AMBULATORY_CARE_PROVIDER_SITE_OTHER): Payer: Medicare Other | Admitting: Family Medicine

## 2012-02-04 VITALS — BP 150/67 | HR 93 | Resp 18 | Ht 62.75 in | Wt 199.1 lb

## 2012-02-04 DIAGNOSIS — I10 Essential (primary) hypertension: Secondary | ICD-10-CM

## 2012-02-04 DIAGNOSIS — E785 Hyperlipidemia, unspecified: Secondary | ICD-10-CM

## 2012-02-04 DIAGNOSIS — E119 Type 2 diabetes mellitus without complications: Secondary | ICD-10-CM

## 2012-02-04 DIAGNOSIS — F068 Other specified mental disorders due to known physiological condition: Secondary | ICD-10-CM | POA: Diagnosis not present

## 2012-02-04 DIAGNOSIS — I2510 Atherosclerotic heart disease of native coronary artery without angina pectoris: Secondary | ICD-10-CM

## 2012-02-04 DIAGNOSIS — F329 Major depressive disorder, single episode, unspecified: Secondary | ICD-10-CM

## 2012-02-04 DIAGNOSIS — R079 Chest pain, unspecified: Secondary | ICD-10-CM

## 2012-02-04 MED ORDER — POTASSIUM CHLORIDE ER 10 MEQ PO CPCR: ORAL_CAPSULE | ORAL | Status: DC

## 2012-02-04 MED ORDER — METOPROLOL TARTRATE 25 MG PO TABS: ORAL_TABLET | ORAL | Status: DC

## 2012-02-04 MED ORDER — ISOSORBIDE MONONITRATE ER 30 MG PO TB24: 30.0000 mg | ORAL_TABLET | Freq: Every day | ORAL | Status: DC

## 2012-02-04 NOTE — Patient Instructions (Signed)
F/u in 5 weeks  You are being referred to cardiology for further evaluation of your heart.  You are being referred to social worker with [facility], you are a bit confused wiith medications.   STOP CELEBREX   START POTASSIUM, ISOSORBIDE AND TOPROL, CONTINUE hCTZ

## 2012-02-04 NOTE — Assessment & Plan Note (Signed)
Recent hospitalization with chest pain, needs card f/u

## 2012-02-04 NOTE — Progress Notes (Signed)
  Subjective:    Patient ID: [name], female    DOB: August 04, 1926, 78 y.o.   MRN: [id number]  HPI Pt is here for f/u of recent hospitalization for chest  pain. Although she ruled out for ACS, it is felt that she will benefit from further cardiology evaluation.  Pt is in today chest pain free but still confused about medication she should be taking and has not followed the instructions on her recent d/c summatry sheet. Referral to[facility] is indicated  Review of Systems See HPI Denies recent fever or chills. Denies sinus pressure, nasal congestion, ear pain or sore throat. Denies chest congestion, productive cough or wheezing. Denies chest pains, palpitations and leg swelling Denies abdominal pain, nausea, vomiting,diarrhea or constipation.   Denies dysuria, frequency, hesitancy or incontinence. Chronic  joint pain,  and limitation in mobility. Denies headaches, seizures, numbness, or tingling. Denies depression, anxiety or insomnia. Denies skin break down or rash.        Objective:   Physical Exam Patient alert and oriented and in no cardiopulmonary distress.  HEENT: No facial asymmetry, EOMI, no sinus tenderness,  oropharynx pink and moist.  Neck supple no adenopathy.  Chest: Clear to auscultation bilaterally.decreased air entry  CVS: S1, S2 no murmurs, no S3.  ABD: Soft non tender. Bowel sounds normal.  Ext: No edema  KG:[id number]  ROM spine, shoulders, hips and knees.  Skin: Intact, no ulcerations or rash noted.  Psych: Good eye contact, normal affect. Memory loss not anxious or depressed appearing.  CNS: CN 2-12 intact, power, normal throughout.        Assessment & Plan:

## 2012-02-10 NOTE — Assessment & Plan Note (Signed)
Controlled, no change in medication  

## 2012-02-10 NOTE — Assessment & Plan Note (Signed)
Resolved currently but required recent hospitalization, card out pt eval requested

## 2012-02-10 NOTE — Assessment & Plan Note (Signed)
Obvious memory loss with confusion about medication management

## 2012-02-10 NOTE — Assessment & Plan Note (Signed)
Uncontrolled, however medically non compliant with treatment due to relative lack of understanding, needs help, referral made

## 2012-02-10 NOTE — Assessment & Plan Note (Signed)
Hyperlipidemia:Low fat diet discussed and encouraged.  Pt to start lipitor

## 2012-02-10 NOTE — Assessment & Plan Note (Signed)
Diet controlled only 

## 2012-02-11 ENCOUNTER — Other Ambulatory Visit: Payer: Self-pay | Admitting: Family Medicine

## 2012-02-12 ENCOUNTER — Telehealth: Payer: Self-pay | Admitting: Family Medicine

## 2012-02-16 NOTE — Telephone Encounter (Signed)
No call documented.

## 2012-02-28 ENCOUNTER — Ambulatory Visit (INDEPENDENT_AMBULATORY_CARE_PROVIDER_SITE_OTHER): Payer: Medicare Other | Admitting: Cardiology

## 2012-02-28 ENCOUNTER — Encounter: Payer: Self-pay | Admitting: Cardiology

## 2012-02-28 VITALS — BP 199/91 | HR 90 | Resp 16 | Ht 64.0 in | Wt 200.0 lb

## 2012-02-28 DIAGNOSIS — I2510 Atherosclerotic heart disease of native coronary artery without angina pectoris: Secondary | ICD-10-CM

## 2012-02-28 DIAGNOSIS — E119 Type 2 diabetes mellitus without complications: Secondary | ICD-10-CM

## 2012-02-28 DIAGNOSIS — E785 Hyperlipidemia, unspecified: Secondary | ICD-10-CM

## 2012-02-28 DIAGNOSIS — I10 Essential (primary) hypertension: Secondary | ICD-10-CM | POA: Diagnosis not present

## 2012-02-28 DIAGNOSIS — R079 Chest pain, unspecified: Secondary | ICD-10-CM | POA: Diagnosis not present

## 2012-02-28 MED ORDER — METOPROLOL TARTRATE 25 MG PO TABS: 25.0000 mg | ORAL_TABLET | Freq: Two times a day (BID) | ORAL | Status: DC

## 2012-02-28 MED ORDER — LISINOPRIL-HYDROCHLOROTHIAZIDE 10-12.5 MG PO TABS: 1.0000 | ORAL_TABLET | Freq: Every day | ORAL | Status: DC

## 2012-02-28 NOTE — Assessment & Plan Note (Signed)
Followed by Dr. Simpson. 

## 2012-02-28 NOTE — Assessment & Plan Note (Signed)
Blood pressure not well controlled. Lopressor to BID dosing and change HCTZ to Lisinopril/HCTZ 10/12.5 mg daily with followup BMET in 2 weeks.

## 2012-02-28 NOTE — Assessment & Plan Note (Signed)
Probable angina - she reports that this is better recently.

## 2012-02-28 NOTE — Assessment & Plan Note (Signed)
History reviewed, mainly in circumflex distribution in 2005. Has likely had some progression over time. After discussion with her, plan is to treat medically presuming we can attain symptom control. Echocardiogram to be obtained for LV function and to assess AV. Should take Lopressor twice daily. Followup arranged.

## 2012-02-28 NOTE — Patient Instructions (Signed)
**Note De-Identified  Obfuscation** Your physician has requested that you have an echocardiogram. Echocardiography is a painless test that uses sound waves to create images of your heart. It provides your doctor with information about the size and shape of your heart and how well your heart's chambers and valves are working. This procedure takes approximately one hour. There are no restrictions for this procedure.   Your physician has recommended you make the following change in your medication: Stop taking Hydrochloriazide and start taking Lisinopril/HCTZ 10/12.5 mg daily  Your physician recommends that you return for lab work in: 2 weeks ( April 10)  Your physician recommends that you schedule a follow-up appointment in: 2 months

## 2012-02-28 NOTE — Assessment & Plan Note (Signed)
Now on Lipitor with recent LDL 135. Goal is under 100.

## 2012-02-28 NOTE — Progress Notes (Signed)
Clinical Summary [name] is a 78 y.o.female referred for cardiology consultation by Dr. Lodema Hong. History reviewed including prior followup with Dr. Dorethea Olson and cardiac catheterization in 2005. She had proximal 70% circumflex stenosis at that time that was managed medically.  She reports chest pain with emotional upset, not purely exertional. This has been better with recent medication additions including Lopressor and Imdur. She is only taking the Lopressor once daily however.  Recent ECG from March reviewed and essentially normal. Echocardiogram was not performed during her hospital stay in March.  Today we discussed her diagnosis of CAD and probable synptoms of angina. She voices most comfort with medical therapy at this time rather than pursuing other ischemic testing.   No Known Allergies  Current Outpatient Prescriptions  Medication Sig Dispense Refill  . aspirin EC 81 MG tablet Take 1 tablet (81 mg total) by mouth daily.      Marland Kitchen atorvastatin (LIPITOR) 10 MG tablet Take 1 tablet (10 mg total) by mouth at bedtime. New medication for high cholesterol.  30 tablet  2  . donepezil (ARICEPT) 10 MG tablet Take 1 tablet (10 mg total) by mouth at bedtime.  30 tablet  11  . isosorbide mononitrate (IMDUR) 30 MG 24 hr tablet Take 1 tablet (30 mg total) by mouth daily. Take one tablet by mouth once a day  30 tablet  5  . levalbuterol (XOPENEX HFA) 45 MCG/ACT inhaler Inhale 1-2 puffs into the lungs every 6 (six) hours as needed for wheezing or shortness of breath.  1 Inhaler    . potassium chloride (MICRO-K) 10 MEQ CR capsule One capsule twice daily  60 capsule  5  . lisinopril-hydrochlorothiazide (PRINZIDE,ZESTORETIC) 10-12.5 MG per tablet Take 1 tablet by mouth daily.  30 tablet  3  . metoprolol tartrate (LOPRESSOR) 25 MG tablet Take 1 tablet (25 mg total) by mouth 2 (two) times daily.  60 tablet  2   Current Facility-Administered Medications  Medication Dose Route Frequency Provider Last Rate  Last Dose  . Influenza (>/= 3 years) inactive virus vaccine (FLVIRIN/FLUZONE) injection SUSP 0.5 mL  0.5 mL Intramuscular Once Kerri Perches, MD        Past Medical History  Diagnosis Date  . Type 2 diabetes mellitus   . Depression   . Hyperlipidemia   . Obesity   . Essential hypertension, benign   . Coronary atherosclerosis of native coronary artery October 2005.    Cardiac cath revealed 70% circumflex stenosis. Treated medically. Ejection fraction 55-65%.  . Myocardial infarction October 2005  . Dementia   . Colon polyps     Past Surgical History  Procedure Date  . Vesicovaginal fistula closure w/ tah   . Abdominal hysterectomy   . Appendectomy     Age 65    Family History  Problem Relation Age of Onset  . Cancer Mother     Breast   . Heart attack Mother   . Alcohol abuse Father   . Heart attack Sister   . Diabetes Sister     Social History [name] reports that she has been smoking Cigarettes.  She has been smoking about .25 packs per day. She does not have any smokeless tobacco history on file. [name] reports that she does not drink alcohol.  Review of Systems No syncope, no palpitations. No bleeding problems. Has memory deficits. No regular exercise.  Physical Examination Filed Vitals:   02/28/12 1015  BP: 199/91  Pulse: 90  Resp: 16  Overweight elderly woman in NAD. HEENT: Conjunctiva and lids normal, oropharynx with poor dentition. Neck: Supple, no elevated JVP or carotid bruits, no thyromegaly. Lungs: Clear to auscultation, nonlabored breathing at rest. Cardiac: Regular rate and rhythm, no S3, 2/6 systolic murmur at base radiates to neck, second heart sound preserved, no pericardial rub. Abdomen: Soft, nontender, bowel sounds present, no guarding or rebound. Extremities: No pitting edema, distal pulses 1+. Skin: Warm and dry. Musculoskeletal: Mild kyphosis. Neuropsychiatric: Alert and oriented x3, affect grossly  appropriate.    Problem List and Plan

## 2012-03-04 ENCOUNTER — Ambulatory Visit (HOSPITAL_COMMUNITY)
Admission: RE | Admit: 2012-03-04 | Discharge: 2012-03-04 | Disposition: A | Discharge status: Home / Self Care | Payer: No Typology Code available for payment source | Source: Ambulatory Visit | Attending: Cardiology | Admitting: Cardiology

## 2012-03-04 DIAGNOSIS — E785 Hyperlipidemia, unspecified: Secondary | ICD-10-CM | POA: Insufficient documentation

## 2012-03-04 DIAGNOSIS — I379 Nonrheumatic pulmonary valve disorder, unspecified: Secondary | ICD-10-CM | POA: Diagnosis not present

## 2012-03-04 DIAGNOSIS — E119 Type 2 diabetes mellitus without complications: Secondary | ICD-10-CM | POA: Insufficient documentation

## 2012-03-04 DIAGNOSIS — I10 Essential (primary) hypertension: Secondary | ICD-10-CM | POA: Insufficient documentation

## 2012-03-04 DIAGNOSIS — I2510 Atherosclerotic heart disease of native coronary artery without angina pectoris: Secondary | ICD-10-CM | POA: Insufficient documentation

## 2012-03-04 NOTE — Progress Notes (Signed)
*  PRELIMINARY RESULTS* Echocardiogram 2D Echocardiogram has been performed.  Conrad Mishawaka 03/04/2012, 10:22 AM

## 2012-03-10 ENCOUNTER — Encounter: Payer: Self-pay | Admitting: Family Medicine

## 2012-03-10 ENCOUNTER — Ambulatory Visit (INDEPENDENT_AMBULATORY_CARE_PROVIDER_SITE_OTHER): Payer: Medicare Other | Admitting: Family Medicine

## 2012-03-10 VITALS — BP 140/86 | HR 70 | Resp 18 | Ht 62.75 in | Wt 196.0 lb

## 2012-03-10 DIAGNOSIS — E119 Type 2 diabetes mellitus without complications: Secondary | ICD-10-CM

## 2012-03-10 DIAGNOSIS — F329 Major depressive disorder, single episode, unspecified: Secondary | ICD-10-CM

## 2012-03-10 DIAGNOSIS — E669 Obesity, unspecified: Secondary | ICD-10-CM | POA: Diagnosis not present

## 2012-03-10 DIAGNOSIS — E785 Hyperlipidemia, unspecified: Secondary | ICD-10-CM

## 2012-03-10 DIAGNOSIS — I10 Essential (primary) hypertension: Secondary | ICD-10-CM

## 2012-03-10 DIAGNOSIS — R5381 Other malaise: Secondary | ICD-10-CM

## 2012-03-10 DIAGNOSIS — F068 Other specified mental disorders due to known physiological condition: Secondary | ICD-10-CM

## 2012-03-10 DIAGNOSIS — F3289 Other specified depressive episodes: Secondary | ICD-10-CM

## 2012-03-10 NOTE — Progress Notes (Signed)
  Subjective:    Patient ID: [name], female    DOB: September 03, 1926, 78 y.o.   MRN: [id number]  HPI The PT is here for follow up and re-evaluation of chronic medical conditions, medication management and review of any available recent lab and radiology data.  Preventive health is updated, specifically  Cancer screening and Immunization.   Questions or concerns regarding consultations or procedures which the PT has had in the interim are  addressed. The PT denies any adverse reactions to current medications since the last visit.  There are no new concerns.  There are no specific complaints       Review of Systems See HPI Denies recent fever or chills. Denies sinus pressure, nasal congestion, ear pain or sore throat. Denies chest congestion, productive cough or wheezing. Denies chest pains, palpitations and leg swelling Denies abdominal pain, nausea, vomiting,diarrhea or constipation.   Denies dysuria, frequency, hesitancy or incontinence. Chronic  joint pain, swelling and limitation in mobility. Denies headaches, seizures, numbness, or tingling. Denies uncontrolled  depression, anxiety or insomnia. Denies skin break down or rash.        Objective:   Physical Exam  Patient alert and oriented and in no cardiopulmonary distress.  HEENT: No facial asymmetry, EOMI, no sinus tenderness,  oropharynx pink and moist.  Neck supple no adenopathy.  Chest: Clear to auscultation bilaterally.Decreased air entry throughout  CVS: S1, S2 no murmurs, no S3.  ABD: Soft non tender. Bowel sounds normal.  Ext: No edema  WU:[id number] ROM spine, shoulders, hips and knees.  Skin: Intact, no ulcerations or rash noted.  Psych: Good eye contact, normal affect. Memory loss not anxious or depressed appearing.  CNS: CN 2-12 intact, power, tone and sensation normal throughout.       Assessment & Plan:

## 2012-03-10 NOTE — Patient Instructions (Addendum)
F/u in mid  September  Fasting lipid, cmp and EGFR, HBA1C 1 week before appointment.  No changes in medication at this time

## 2012-03-15 NOTE — Assessment & Plan Note (Signed)
Continue meds, pt also referred for eval by [facility]

## 2012-03-15 NOTE — Assessment & Plan Note (Signed)
Controlled, no change in medication  

## 2012-03-15 NOTE — Assessment & Plan Note (Signed)
Diet controlled only 

## 2012-03-15 NOTE — Assessment & Plan Note (Signed)
Uncontrolled, no med change, low fat diet stressed

## 2012-03-19 ENCOUNTER — Ambulatory Visit: Payer: No Typology Code available for payment source | Admitting: Family Medicine

## 2012-05-04 ENCOUNTER — Ambulatory Visit (INDEPENDENT_AMBULATORY_CARE_PROVIDER_SITE_OTHER): Payer: Medicare Other | Admitting: Cardiology

## 2012-05-04 ENCOUNTER — Encounter: Payer: Self-pay | Admitting: Cardiology

## 2012-05-04 VITALS — BP 123/72 | HR 64 | Ht 64.0 in | Wt 185.0 lb

## 2012-05-04 DIAGNOSIS — I2510 Atherosclerotic heart disease of native coronary artery without angina pectoris: Secondary | ICD-10-CM | POA: Diagnosis not present

## 2012-05-04 DIAGNOSIS — I10 Essential (primary) hypertension: Secondary | ICD-10-CM

## 2012-05-04 NOTE — Progress Notes (Signed)
Clinical Summary [name] is a 78 y.o.female presenting for followup. She was seen in April. We have been managing her medically for CAD.  Echocardiogram from April showed mild LVH with LVEF 65-70%, diastolic dysfunction, mildly calcified aortic valve without stenosis, moderate left atrial enlargement, trivial mitral regurgitation, mild tricuspid regurgitation. We reviewed these results again today.  She reports no chest pain, no progressive shortness of breath. Indicates compliance with her medications which are outlined below.   No Known Allergies  Current Outpatient Prescriptions  Medication Sig Dispense Refill  . aspirin EC 81 MG tablet Take 1 tablet (81 mg total) by mouth daily.      Marland Kitchen atorvastatin (LIPITOR) 10 MG tablet Take 1 tablet (10 mg total) by mouth at bedtime. New medication for high cholesterol.  30 tablet  2  . donepezil (ARICEPT) 10 MG tablet Take 1 tablet (10 mg total) by mouth at bedtime.  30 tablet  11  . FLUoxetine (PROZAC) 20 MG tablet Take 20 mg by mouth daily.      . isosorbide mononitrate (IMDUR) 30 MG 24 hr tablet Take 1 tablet (30 mg total) by mouth daily. Take one tablet by mouth once a day  30 tablet  5  . levalbuterol (XOPENEX HFA) 45 MCG/ACT inhaler Inhale 1-2 puffs into the lungs every 6 (six) hours as needed for wheezing or shortness of breath.  1 Inhaler    . lisinopril-hydrochlorothiazide (PRINZIDE,ZESTORETIC) 10-12.5 MG per tablet Take 1 tablet by mouth daily.  30 tablet  3  . metoprolol tartrate (LOPRESSOR) 25 MG tablet Take 1 tablet (25 mg total) by mouth 2 (two) times daily.  60 tablet  2  . potassium chloride (MICRO-K) 10 MEQ CR capsule One capsule twice daily  60 capsule  5   Current Facility-Administered Medications  Medication Dose Route Frequency Provider Last Rate Last Dose  . Influenza (>/= 3 years) inactive virus vaccine (FLVIRIN/FLUZONE) injection SUSP 0.5 mL  0.5 mL Intramuscular Once Kerri Perches, MD        Past Medical History   Diagnosis Date  . Type 2 diabetes mellitus   . Depression   . Hyperlipidemia   . Obesity   . Essential hypertension, benign   . Coronary atherosclerosis of native coronary artery October 2005.    Cardiac cath revealed 70% circumflex stenosis. Treated medically. Ejection fraction 55-65%.  . Myocardial infarction October 2005  . Dementia   . Colon polyps     Social History Ms. Kubin reports that she has been smoking Cigarettes.  She has been smoking about .25 packs per day. She does not have any smokeless tobacco history on file. Ms. Servidio reports that she does not drink alcohol.  Review of Systems No palpitations. Stable appetite. Otherwise negative.  Physical Examination Filed Vitals:   05/04/12 1420  BP: 123/72  Pulse: 64    Overweight elderly woman in NAD.  HEENT: Conjunctiva and lids normal, oropharynx with poor dentition.  Neck: Supple, no elevated JVP or carotid bruits, no thyromegaly.  Lungs: Clear to auscultation, nonlabored breathing at rest.  Cardiac: Regular rate and rhythm, no S3, 2/6 systolic murmur at base radiates to neck, second heart sound preserved, no pericardial rub.  Abdomen: Soft, nontender, bowel sounds present, no guarding or rebound.  Extremities: No pitting edema, distal pulses 1+.    Problem List and Plan   Coronary atherosclerosis of native coronary artery Plan is to continue medical therapy and observation for now. LVEF remains normal. Followup in 6 months.  Essential hypertension, benign Blood pressure is well controlled today. No changes made.    Jonelle Sidle, M.D., F.A.C.C.

## 2012-05-04 NOTE — Assessment & Plan Note (Signed)
Blood pressure is well controlled today. No changes made. 

## 2012-05-04 NOTE — Patient Instructions (Addendum)
Your physician recommends that you schedule a follow-up appointment in: 6 month follow up  

## 2012-05-04 NOTE — Assessment & Plan Note (Signed)
Plan is to continue medical therapy and observation for now. LVEF remains normal. Followup in 6 months.

## 2012-05-29 ENCOUNTER — Telehealth: Payer: Self-pay | Admitting: *Deleted

## 2012-05-29 ENCOUNTER — Other Ambulatory Visit: Payer: Self-pay | Admitting: *Deleted

## 2012-05-29 ENCOUNTER — Encounter: Payer: Self-pay | Admitting: *Deleted

## 2012-05-29 DIAGNOSIS — I10 Essential (primary) hypertension: Secondary | ICD-10-CM

## 2012-05-29 NOTE — Telephone Encounter (Signed)
Multiple attempts made to contact patient regarding pending BMET.  Unable to contact by phone, will send a final letter.

## 2012-06-04 ENCOUNTER — Other Ambulatory Visit: Payer: Self-pay | Admitting: *Deleted

## 2012-06-04 DIAGNOSIS — E782 Mixed hyperlipidemia: Secondary | ICD-10-CM

## 2012-06-04 DIAGNOSIS — E119 Type 2 diabetes mellitus without complications: Secondary | ICD-10-CM

## 2012-06-24 ENCOUNTER — Encounter: Payer: Self-pay | Admitting: *Deleted

## 2012-06-29 ENCOUNTER — Other Ambulatory Visit: Payer: Self-pay | Admitting: *Deleted

## 2012-06-29 ENCOUNTER — Encounter: Payer: Self-pay | Admitting: *Deleted

## 2012-06-29 DIAGNOSIS — E782 Mixed hyperlipidemia: Secondary | ICD-10-CM

## 2012-06-29 DIAGNOSIS — I10 Essential (primary) hypertension: Secondary | ICD-10-CM

## 2012-06-29 DIAGNOSIS — E119 Type 2 diabetes mellitus without complications: Secondary | ICD-10-CM

## 2012-07-22 ENCOUNTER — Encounter: Payer: Self-pay | Admitting: Family Medicine

## 2012-07-22 ENCOUNTER — Ambulatory Visit (INDEPENDENT_AMBULATORY_CARE_PROVIDER_SITE_OTHER): Payer: No Typology Code available for payment source | Admitting: Family Medicine

## 2012-07-22 VITALS — BP 180/85 | HR 76 | Resp 18 | Wt 189.0 lb

## 2012-07-22 DIAGNOSIS — E119 Type 2 diabetes mellitus without complications: Secondary | ICD-10-CM

## 2012-07-22 DIAGNOSIS — Z23 Encounter for immunization: Secondary | ICD-10-CM

## 2012-07-22 DIAGNOSIS — I10 Essential (primary) hypertension: Secondary | ICD-10-CM

## 2012-07-22 DIAGNOSIS — F068 Other specified mental disorders due to known physiological condition: Secondary | ICD-10-CM

## 2012-07-22 DIAGNOSIS — E785 Hyperlipidemia, unspecified: Secondary | ICD-10-CM

## 2012-07-22 NOTE — Patient Instructions (Addendum)
Annual wellness in 4.5 month  Flu vaccine today, also we will give you the med for your heart in the office today  Fasting lipid , cmp, HBA1C today   You need to take medication for blood pressure and heart, memory and a baby aspirin  Stop the medicine for cholesterol unless I call asking you to start, and stop medication for depression, you have not taken any since May!!!  Please fill pill box every week and take all medicine every morning   Please think about quitting smoking.  This is very important for your health.  Consider setting a quit date, then cutting back or switching brands to prepare to stop.  Also think of the money you will save every day by not smoking.  Quick Tips to Quit Smoking: Fix a date i.e. keep a date in mind from when you would not touch a tobacco product to smoke  Keep yourself busy and block your mind with work loads or reading books or watching movies in malls where smoking is not allowed  Vanish off the things which reminds you about smoking for example match box, or your favorite lighter, or the pipe you used for smoking, or your favorite jeans and shirt with which you used to enjoy smoking, or the club where you used to do smoking  Try to avoid certain people places and incidences where and with whom smoking is a common factor to add on  Praise yourself with some token gifts from the money you saved by stopping smoking  Anti Smoking teams are there to help you. Join their programs  Anti-smoking Gums are there in many medical shops. Try them to quit smoking   Side-effects of Smoking: Disease caused by smoking cigarettes are emphysema, bronchitis, heart failures  Premature death  Cancer is the major side effect of smoking  Heart attacks and strokes are the quick effects of smoking causing sudden death  Some smokers lives end up with limbs amputated  Breathing problem or fast breathing is another side effect of smoking  Due to more intakes of smokes,  carbon mono-oxide goes into your brain and other muscles of the body which leads to swelling of the veins and blockage to the air passage to lungs  Carbon monoxide blocks blood vessels which leads to blockage in the flow of blood to different major body organs like heart lungs and thus leads to attacks and deaths  During pregnancy smoking is very harmful and leads to premature birth of the infant, spontaneous abortions, low weight of the infant during birth  Fat depositions to narrow and blocked blood vessels causing heart attacks  In many cases cigarette smoking caused infertility in men

## 2012-07-23 LAB — LIPID PANEL
Cholesterol: 178 mg/dL (ref 0–200)
LDL Cholesterol: 116 mg/dL — ABNORMAL HIGH (ref 0–99)
Total CHOL/HDL Ratio: 3.8 Ratio
VLDL: 15 mg/dL (ref 0–40)

## 2012-07-23 LAB — COMPREHENSIVE METABOLIC PANEL
Alkaline Phosphatase: 73 U/L (ref 39–117)
BUN: 12 mg/dL (ref 6–23)
Glucose, Bld: 91 mg/dL (ref 70–99)
Total Bilirubin: 0.5 mg/dL (ref 0.3–1.2)

## 2012-07-26 NOTE — Progress Notes (Signed)
  Subjective:    Patient ID: [name], female    DOB: 11/25/1925, 78 y.o.   MRN: [id number]  HPI The PT is here for follow up and re-evaluation of chronic medical conditions, medication management and review of any available recent lab and radiology data.  Preventive health is updated, specifically  Cancer screening and Immunization.   Pt just stopped taking her chronic meds for no reason since 4 months ago, felt she does not need them anymore. Willing to take what is deemed necessary. Denies depression and anxiety, dementia and forgetfulness clearly are worsening.She is accompanied by a friend who looks out for her, as always. No falls. Ongoing occasional nicotine, no plan to quit despite counseling      Review of Systems The PT is here for follow up and re-evaluation of chronic medical conditions, medication management and review of any available recent lab and radiology data.  Preventive health is updated, specifically  Cancer screening and Immunization.   Questions or concerns regarding consultations or procedures which the PT has had in the interim are  addressed. The PT denies any adverse reactions to current medications since the last visit.  There are no new concerns.  There are no specific complaints    Denies recent fever or chills. Denies sinus pressure, nasal congestion, ear pain or sore throat. Denies chest congestion, productive cough or wheezing. Denies chest pains, palpitations and leg swelling Denies abdominal pain, nausea, vomiting,diarrhea or constipation.   Denies dysuria, frequency, hesitancy or incontinence. Chronic  joint pain,  and limitation in mobility. Denies headaches, seizures, numbness, or tingling. Denies depression, anxiety or insomnia. Denies skin break down or rash.        Objective:   Physical Exam  Patient alert and oriented and in no cardiopulmonary distress.  HEENT: No facial asymmetry, EOMI, no sinus tenderness,  oropharynx pink  and moist.  Neck decreased ROm no adenopathy.  Chest: Clear to auscultation bilaterally.Decreased throughout  CVS: S1, S2 no murmurs, no S3.  ABD: Soft non tender. Bowel sounds normal.  Ext: No edema  MS: decreased ROM spine, shoulders, hips and knees.  Skin: Intact, no ulcerations or rash noted.  Psych: Good eye contact, normal affect. Memory loss not anxious or depressed appearing.  CNS: CN 2-12 intact, power, tone and sensation normal throughout.       Assessment & Plan:

## 2012-07-26 NOTE — Assessment & Plan Note (Signed)
Progressively worsening, unintentionally non compliant with meds. Safety issues re addressed, her neighbor provides food/she uses microwave only Needs to resume meds

## 2012-07-26 NOTE — Assessment & Plan Note (Signed)
Uncontrolled, due to non compliance. Re educated re the importance of same. DASH diet and commitment to daily physical activity for a minimum of 30 minutes discussed and encouraged, as a part of hypertension management. The importance of attaining a healthy weight is also discussed. Meds given at OV

## 2012-07-26 NOTE — Assessment & Plan Note (Signed)
Dietary management only adequate control

## 2012-09-07 ENCOUNTER — Telehealth: Payer: Self-pay | Admitting: Family Medicine

## 2012-09-07 NOTE — Telephone Encounter (Signed)
pLS LET  aLISA WITH thn KNOW THAT I HAVE REVIEWED AND AGREE WITH HER CONCERNS RE WORSENING DEMENTIA, MED NON COMPLIANCE AND NEED FOR LONG TERM CARE FOR mARYU. aSK THAT SHE TRY TO ENCOURAGE PT , ALONG WITH HER NEXT DOOR NEIGHBOR WHO "HELPS" her that thsi is in her best interest and help as much as possible  In getting her thinking in that direction pls

## 2012-09-10 NOTE — Telephone Encounter (Signed)
alisa aware  

## 2012-11-24 ENCOUNTER — Telehealth: Payer: Self-pay | Admitting: Family Medicine

## 2012-11-29 NOTE — Telephone Encounter (Signed)
No telephone message seen, no call made

## 2012-12-17 ENCOUNTER — Encounter: Payer: No Typology Code available for payment source | Admitting: Family Medicine

## 2012-12-25 ENCOUNTER — Encounter: Payer: Self-pay | Admitting: Family Medicine

## 2012-12-25 ENCOUNTER — Ambulatory Visit (INDEPENDENT_AMBULATORY_CARE_PROVIDER_SITE_OTHER): Payer: No Typology Code available for payment source | Admitting: Family Medicine

## 2012-12-25 VITALS — BP 190/84 | HR 69 | Resp 16 | Wt 191.0 lb

## 2012-12-25 DIAGNOSIS — I2510 Atherosclerotic heart disease of native coronary artery without angina pectoris: Secondary | ICD-10-CM

## 2012-12-25 DIAGNOSIS — F068 Other specified mental disorders due to known physiological condition: Secondary | ICD-10-CM

## 2012-12-25 DIAGNOSIS — I10 Essential (primary) hypertension: Secondary | ICD-10-CM

## 2012-12-25 LAB — BASIC METABOLIC PANEL
BUN: 10 mg/dL (ref 6–23)
CO2: 29 mEq/L (ref 19–32)
Chloride: 104 mEq/L (ref 96–112)
Glucose, Bld: 104 mg/dL — ABNORMAL HIGH (ref 70–99)
Potassium: 3.6 mEq/L (ref 3.5–5.3)

## 2012-12-25 MED ORDER — ISOSORBIDE MONONITRATE ER 30 MG PO TB24: 30.0000 mg | ORAL_TABLET | Freq: Every day | ORAL | Status: DC

## 2012-12-25 MED ORDER — METOPROLOL SUCCINATE ER 25 MG PO TB24: 25.0000 mg | ORAL_TABLET | Freq: Every day | ORAL | Status: DC

## 2012-12-25 MED ORDER — LISINOPRIL-HYDROCHLOROTHIAZIDE 10-12.5 MG PO TABS: 1.0000 | ORAL_TABLET | Freq: Every day | ORAL | Status: DC

## 2012-12-25 NOTE — Assessment & Plan Note (Signed)
Dementia noted in the chart which is contributing to some noncompliance with her medications she does live at home she has a friend that comes and checks on her she does not want anyone in the house with her. She states that the memory medicine is not helping and she did not get it filled again. At this time a will stop the Aricept until she is seen by her primary care provider to see if this is deemed necessary.

## 2012-12-25 NOTE — Assessment & Plan Note (Signed)
Review last cardiology note I will restart her on the indoor along with her blood pressure medications she also takes aspirin every now and then. Her lipid panel looked pretty good back in September 2013

## 2012-12-25 NOTE — Patient Instructions (Signed)
Restart the heart medications- 3 bottles Take 1 tablet each day Get the labs done F/U 4 weeks Annual Wellness with Dr. Lodema Hong

## 2012-12-25 NOTE — Assessment & Plan Note (Signed)
Pressure uncontrolled even on repeat check. Discussed with her the importance of medications will have her restart the Toprol and the lisinopril HCTZ. She will be sent for metabolic panel today She's to followup in 4 weeks with her PCP currently she will be more compliant with her medications but understands with her dementia this is difficult.

## 2012-12-25 NOTE — Progress Notes (Signed)
  Subjective:    Patient ID: [name], female    DOB: Mar 15, 1926, 79 y.o.   MRN: [id number]  HPI  Patient here to follow chronic medical problems. She states she has no concerns she only came in because she could not get her pills refilled. We reviewed her empty pill bottles her last bottles were from July of 2013 I reviewed her previous PCP note she is noncompliant with her medication secondary to dementia. She is accompanied today by a family friend who watches over her however she does live alone.  Review of Systems   GEN- denies fatigue, fever, weight loss,weakness, recent illness HEENT- denies eye drainage, change in vision, nasal discharge, CVS- denies chest pain, palpitations RESP- denies SOB, cough, wheeze ABD- denies N/V, change in stools, abd pain GU- denies dysuria, hematuria, dribbling, incontinence MSK- denies joint pain, muscle aches, injury Neuro- denies headache, dizziness, syncope, seizure activity      Objective:   Physical Exam GEN- NAD, alert and oriented x3 HEENT- PERRL, EOMI, non injected sclera, pink conjunctiva, MMM, oropharynx clear Neck- Supple, no JVD CVS- RRR,  2/6 SEM  RESP- mild Right basilar crackles ABD-NABS,soft,NT,ND EXT- No edema Pulses- Radial, DP- 2+        Assessment & Plan:

## 2012-12-26 LAB — CBC
MCH: 26.4 pg (ref 26.0–34.0)
MCV: 81.2 fL (ref 78.0–100.0)
Platelets: 278 10*3/uL (ref 150–400)
RBC: 4.85 MIL/uL (ref 3.87–5.11)
RDW: 16.3 % — ABNORMAL HIGH (ref 11.5–15.5)

## 2013-01-18 ENCOUNTER — Ambulatory Visit: Payer: No Typology Code available for payment source | Admitting: Family Medicine

## 2013-03-22 ENCOUNTER — Encounter: Payer: Self-pay | Admitting: Family Medicine

## 2013-03-22 ENCOUNTER — Ambulatory Visit (INDEPENDENT_AMBULATORY_CARE_PROVIDER_SITE_OTHER): Payer: No Typology Code available for payment source | Admitting: Family Medicine

## 2013-03-22 VITALS — BP 142/80 | HR 83 | Resp 16 | Wt 186.0 lb

## 2013-03-22 DIAGNOSIS — E559 Vitamin D deficiency, unspecified: Secondary | ICD-10-CM

## 2013-03-22 DIAGNOSIS — F17200 Nicotine dependence, unspecified, uncomplicated: Secondary | ICD-10-CM

## 2013-03-22 DIAGNOSIS — R7303 Prediabetes: Secondary | ICD-10-CM

## 2013-03-22 DIAGNOSIS — R5383 Other fatigue: Secondary | ICD-10-CM

## 2013-03-22 DIAGNOSIS — I2510 Atherosclerotic heart disease of native coronary artery without angina pectoris: Secondary | ICD-10-CM

## 2013-03-22 DIAGNOSIS — I10 Essential (primary) hypertension: Secondary | ICD-10-CM

## 2013-03-22 DIAGNOSIS — R7309 Other abnormal glucose: Secondary | ICD-10-CM

## 2013-03-22 DIAGNOSIS — F068 Other specified mental disorders due to known physiological condition: Secondary | ICD-10-CM

## 2013-03-22 DIAGNOSIS — E785 Hyperlipidemia, unspecified: Secondary | ICD-10-CM

## 2013-03-22 DIAGNOSIS — R5381 Other malaise: Secondary | ICD-10-CM

## 2013-03-22 MED ORDER — DONEPEZIL HCL 5 MG PO TABS: 5.0000 mg | ORAL_TABLET | Freq: Every day | ORAL | Status: DC

## 2013-03-22 NOTE — Assessment & Plan Note (Signed)
Reports smoking on avg 2 ciggs per day. Patient counseled for approximately 5 minutes regarding the health risks of ongoing nicotine use, specifically all types of cancer, heart disease, stroke and respiratory failure. The options available for help with cessation ,the behavioral changes to assist the process, and the option to either gradully reduce usage  Or abruptly stop.is also discussed. Pt is also encouraged to set specific goals in number of cigarettes used daily, as well as to set a quit date.

## 2013-03-22 NOTE — Assessment & Plan Note (Signed)
Blood pressure an acceptable value off medication, no anti hypertensive med will be prescribed other than the toprol for her hearrt disease

## 2013-03-22 NOTE — Assessment & Plan Note (Signed)
Updated lab today, Hyperlipidemia:Low fat diet discussed and encouraged.   

## 2013-03-22 NOTE — Progress Notes (Signed)
  Subjective:    Patient ID: [name], female    DOB: May 15, 1926, 79 y.o.   MRN: [id number]  HPI The PT is here for follow up and re-evaluation of chronic medical conditions, medication management and review of any available recent lab and radiology data.  Preventive health is updated, specifically  Immunization.   She has no complaints, still smokes no quit plan.  Has taken not one tablet for over 4 months, has duplicate bottles of 3 different tablets, 6 in all and NTG which was dispensed in 2006     Review of Systems    See HPI Denies recent fever or chills. Denies sinus pressure, nasal congestion, ear pain or sore throat. Denies chest congestion, productive cough or wheezing. Denies chest pains, palpitations and leg swelling Denies abdominal pain, nausea, vomiting,diarrhea or constipation.   Denies dysuria, frequency, hesitancy or incontinence. Chronic  joint pain, and limitation in mobility.Does not rely on assistive device Denies headaches, seizures, numbness, or tingling. Denies depression, anxiety or insomnia. Denies skin break down or rash.     Objective:   Physical Exam  Patient alert and oriented and in no cardiopulmonary distress.  HEENT: No facial asymmetry, EOMI, no sinus tenderness,  oropharynx pink and moist.  Neck supple no adenopathy.  Chest: Clear to auscultation bilaterally.  CVS: S1, S2 no murmurs, no S3.  ABD: Soft non tender. Bowel sounds normal.  Ext: No edema  MS: decreased ROM spine, shoulders, hips and knees.  Skin: Intact, no ulcerations or rash noted.  Psych: Good eye contact, normal affect. Memory mildly impaired, MMSE score of 25/30 not anxious or depressed appearing.  CNS: CN 2-12 intact, power, tone  normal throughout.       Assessment & Plan:

## 2013-03-22 NOTE — Patient Instructions (Addendum)
Annual wellness in 4.5 month, call if you need me before  Medications you need to take, are metoprolol 1 in the morning for heart,   Labs today HBA1C, lipid,  and TSH

## 2013-03-22 NOTE — Assessment & Plan Note (Addendum)
MMSE is 25/30 on 03/2013 No indication based on this value for medication at this time

## 2013-03-22 NOTE — Assessment & Plan Note (Signed)
Updated lab today, encouraged to follow low carb diet and keep active

## 2013-03-22 NOTE — Assessment & Plan Note (Signed)
Pt to resume once daily toprol, she has not taken any medications for at least 4 month, Blood pressure will tolerate one only, 2 discontinues. NTG which she has was prescribed in 2006, advised to dispose of same through her pharmacy

## 2013-03-23 LAB — LIPID PANEL
LDL Cholesterol: 147 mg/dL — ABNORMAL HIGH (ref 0–99)
Triglycerides: 115 mg/dL (ref <150)
VLDL: 23 mg/dL (ref 0–40)

## 2013-03-23 LAB — TSH: TSH: 1.029 u[IU]/mL (ref 0.350–4.500)

## 2013-04-05 ENCOUNTER — Other Ambulatory Visit: Payer: Self-pay

## 2013-04-05 ENCOUNTER — Telehealth: Payer: Self-pay | Admitting: Family Medicine

## 2013-04-05 MED ORDER — METOPROLOL SUCCINATE ER 25 MG PO TB24: 25.0000 mg | ORAL_TABLET | Freq: Every day | ORAL | Status: DC

## 2013-04-08 NOTE — Telephone Encounter (Signed)
Patient spoke with nurse

## 2013-04-21 ENCOUNTER — Telehealth: Payer: Self-pay | Admitting: Family Medicine

## 2013-04-21 NOTE — Telephone Encounter (Signed)
Must hear from patient first.

## 2013-04-22 ENCOUNTER — Ambulatory Visit: Payer: No Typology Code available for payment source | Admitting: Family Medicine

## 2013-05-19 ENCOUNTER — Telehealth: Payer: Self-pay | Admitting: Family Medicine

## 2013-05-20 NOTE — Telephone Encounter (Signed)
noted 

## 2013-08-19 ENCOUNTER — Encounter: Payer: No Typology Code available for payment source | Admitting: Family Medicine

## 2013-09-16 ENCOUNTER — Encounter: Payer: Self-pay | Admitting: Family Medicine

## 2013-09-16 ENCOUNTER — Ambulatory Visit (INDEPENDENT_AMBULATORY_CARE_PROVIDER_SITE_OTHER): Payer: No Typology Code available for payment source | Admitting: Family Medicine

## 2013-09-16 ENCOUNTER — Encounter (INDEPENDENT_AMBULATORY_CARE_PROVIDER_SITE_OTHER): Payer: Self-pay

## 2013-09-16 VITALS — BP 138/84 | HR 90 | Resp 18 | Ht 62.75 in | Wt 188.0 lb

## 2013-09-16 DIAGNOSIS — I2510 Atherosclerotic heart disease of native coronary artery without angina pectoris: Secondary | ICD-10-CM

## 2013-09-16 DIAGNOSIS — R7309 Other abnormal glucose: Secondary | ICD-10-CM

## 2013-09-16 DIAGNOSIS — Z23 Encounter for immunization: Secondary | ICD-10-CM

## 2013-09-16 DIAGNOSIS — Z0000 Encounter for general adult medical examination without abnormal findings: Secondary | ICD-10-CM

## 2013-09-16 DIAGNOSIS — E559 Vitamin D deficiency, unspecified: Secondary | ICD-10-CM

## 2013-09-16 DIAGNOSIS — R7303 Prediabetes: Secondary | ICD-10-CM

## 2013-09-16 DIAGNOSIS — I5189 Other ill-defined heart diseases: Secondary | ICD-10-CM

## 2013-09-16 DIAGNOSIS — I519 Heart disease, unspecified: Secondary | ICD-10-CM

## 2013-09-16 DIAGNOSIS — G3184 Mild cognitive impairment, so stated: Secondary | ICD-10-CM

## 2013-09-16 DIAGNOSIS — E785 Hyperlipidemia, unspecified: Secondary | ICD-10-CM

## 2013-09-16 NOTE — Patient Instructions (Signed)
F/u  In 4.5 month, call if you need me before  Flu vaccine today.  vision screen   We will refer you for help from nurse to put out your meedications regularly , sinnce you are forgetting them  HBA1C, lipid and cmp today also vit D

## 2013-09-16 NOTE — Progress Notes (Signed)
Subjective:    Patient ID: [name], female    DOB: 1926-07-18, 79 y.o.   MRN: [id number]  HPI Preventive Screening-Counseling & Management   Patient present here today for a Medicare annual wellness visit.   Current Problems (verified)   Medications Prior to Visit Allergies (verified) takes sporadically, reports and shows that she istaking 3 of 4 prescription meds. Unfortunately, current bottles were dispensed in September with 30 day supplies. Will attempt to get help through visiting nurse or pharmacy. Her insurance company should be able to help with this also  PAST HISTORY  Family History : as documented  Social History Single, lives alone, no children, has a niece who is tangentially involved with her, she has a female friend who lives nearby, and who takes her around. Ongoing  nicotine, no drug or alcohol use   Risk Factors  Current exercise habits:  None regualrly.  Encouraged chair exercises regularly  Dietary issues discussed:low fat low sodium diet, with vegetable and fruit regularly   Cardiac risk factors: CHf history  Depression Screen  (Note: if answer to either of the following is "Yes", a more complete depression screening is indicated)   Over the past two weeks, have you felt down, depressed or hopeless? No  Over the past two weeks, have you felt little interest or pleasure in doing things? No  Have you lost interest or pleasure in daily life? No  Do you often feel hopeless? No  Do you cry easily over simple problems? No   Activities of Daily Living  In your present state of health, do you have any difficulty performing the following activities?  Driving?: Unable now due to memory  issues Managing money?: help from a friend as far as transport, reports being able to keep up with her bill payments as far as being on time Feeding yourself?:No Getting from bed to chair?:takes care Climbing a flight of stairs?:yes, due to arthritis and gets short of  breath Preparing food and eating?:yes gets help with meal prep from her cousin, restricts meal prep to micro wavable food due to memory impairment Bathing or showering?:No Getting dressed?:No Getting to the toilet?:No Using the toilet?:No Moving around from place to place?: no problem moving around house  Fall Risk Assessment In the past year have you fallen or had a near fall?:No Are you currently taking any medications that make you dizzy?:No   Hearing Difficulties: No Do you often ask people to speak up or repeat themselves?:No Do you experience ringing or noises in your ears?:No Do you have difficulty understanding soft or whispered voices?:No  Cognitive Testing  Alert? Yes Normal Appearance?Yes Oriented to place , season, not year, and person Oriented to person? Yes Place? Yes  Time? Yes  Displays appropriate judgment?Yes  Can read the correct time from a watch face? yes Are you having problems remembering things?yes, has diagnosis of dementia, non compliant with medication prescribed  Advanced Directives have been discussed with the patient?Yes , , full code   List the Names of Other Physician/Practitioners you currently use: none   Indicate any recent Medical Services you may have received from other than [facility] providers in the past year (date may be approximate).   Assessment:    Annual Wellness Exam   Plan:    During the course of the visit the patient was educated and counseled about appropriate screening and preventive services including:  A healthy diet is rich in fruit, vegetables and whole grains. Poultry fish, nuts and  beans are a healthy choice for protein rather then red meat. A low sodium diet and drinking 64 ounces of water daily is generally recommended. Oils and sweet should be limited. Carbohydrates especially for those who are diabetic or overweight, should be limited to 30-45 gram per meal. It is important to eat on a regular schedule, at least 3 times  daily. Snacks should be primarily fruits, vegetables or nuts. It is important that you exercise regularly at least 30 minutes 5 times a week. If you develop chest pain, have severe difficulty breathing, or feel very tired, stop exercising immediately and seek medical attention  Immunization reviewed and updated. Cancer screening reviewed and updated    Patient Instructions (the written plan) was given to the patient.  Medicare Attestation  I have personally reviewed:  The patient's medical and social history  Their use of alcohol, tobacco or illicit drugs  Their current medications and supplements  The patient's functional ability including ADLs,fall risks, home safety risks, cognitive, and hearing and visual impairment  Diet and physical activities  Evidence for depression or mood disorders  The patient's weight, height, BMI, and visual acuity have been recorded in the chart. I have made referrals, counseling, and provided education to the patient based on review of the above and I have provided the patient with a written personalized care plan for preventive services.      Review of Systems     Objective:   Physical Exam        Assessment & Plan:

## 2013-09-17 ENCOUNTER — Other Ambulatory Visit: Payer: Self-pay | Admitting: Family Medicine

## 2013-09-17 LAB — COMPREHENSIVE METABOLIC PANEL
ALT: <8 U/L (ref 0–35)
AST: 11 U/L (ref 0–37)
Alkaline Phosphatase: 73 U/L (ref 39–117)
Sodium: 140 mEq/L (ref 135–145)
Total Bilirubin: 0.5 mg/dL (ref 0.3–1.2)
Total Protein: 7 g/dL (ref 6.0–8.3)

## 2013-09-17 LAB — HEMOGLOBIN A1C: Hgb A1c MFr Bld: 5.9 % — ABNORMAL HIGH (ref <5.7)

## 2013-09-17 LAB — VITAMIN D 25 HYDROXY (VIT D DEFICIENCY, FRACTURES): Vit D, 25-Hydroxy: 10 ng/mL — ABNORMAL LOW (ref 30–89)

## 2013-09-17 LAB — LIPID PANEL: LDL Cholesterol: 135 mg/dL — ABNORMAL HIGH (ref 0–99)

## 2013-09-18 ENCOUNTER — Encounter: Payer: Self-pay | Admitting: Family Medicine

## 2013-09-18 ENCOUNTER — Telehealth: Payer: Self-pay | Admitting: Family Medicine

## 2013-09-18 DIAGNOSIS — I5189 Other ill-defined heart diseases: Secondary | ICD-10-CM | POA: Insufficient documentation

## 2013-09-18 DIAGNOSIS — Z0000 Encounter for general adult medical examination without abnormal findings: Secondary | ICD-10-CM | POA: Insufficient documentation

## 2013-09-18 MED ORDER — DONEPEZIL HCL 5 MG PO TABS: 5.0000 mg | ORAL_TABLET | Freq: Every day | ORAL | Status: DC

## 2013-09-18 MED ORDER — ISOSORBIDE MONONITRATE ER 30 MG PO TB24: 30.0000 mg | ORAL_TABLET | Freq: Every day | ORAL | Status: DC

## 2013-09-18 NOTE — Assessment & Plan Note (Signed)
Annual wellness as documented. Pt needs help with medication administration, will f/u on referral for this No commit,ment to smoking cessation, though smokes very little Would benefit from a social worker visit to establish how well she is functioning at home, friend who brings her states her home is clean, but the benefit of an independent professional eye would be good, will arrange for [facility] Flu vaccine administered

## 2013-09-18 NOTE — Telephone Encounter (Signed)
I would like [facility] if possible to see this pt so I can get a better idea of how she is really functioning at her home with her MCI diagnosis and confusion with and non adherence to taking meds. I have actually completed and active,med list she needs to be on based o on med record review , so th list documented is accurate. Pls give me an update as this progresses, thanks

## 2013-09-20 NOTE — Telephone Encounter (Signed)
Referral sent to [facility] and patient aware.

## 2013-09-24 ENCOUNTER — Other Ambulatory Visit: Payer: Self-pay

## 2013-09-24 MED ORDER — METOPROLOL SUCCINATE ER 25 MG PO TB24: 25.0000 mg | ORAL_TABLET | Freq: Every day | ORAL | Status: DC

## 2013-09-24 MED ORDER — ERGOCALCIFEROL 1.25 MG (50000 UT) PO CAPS: 50000.0000 [IU] | ORAL_CAPSULE | ORAL | Status: DC

## 2013-09-24 MED ORDER — ASPIRIN EC 81 MG PO TBEC: 81.0000 mg | DELAYED_RELEASE_TABLET | Freq: Every day | ORAL | Status: AC

## 2014-01-10 ENCOUNTER — Encounter (INDEPENDENT_AMBULATORY_CARE_PROVIDER_SITE_OTHER): Payer: Self-pay

## 2014-01-10 ENCOUNTER — Ambulatory Visit (INDEPENDENT_AMBULATORY_CARE_PROVIDER_SITE_OTHER): Payer: Medicare Other | Admitting: Family Medicine

## 2014-01-10 ENCOUNTER — Encounter: Payer: Self-pay | Admitting: Family Medicine

## 2014-01-10 VITALS — BP 148/86 | HR 72 | Resp 18 | Ht 62.75 in | Wt 187.0 lb

## 2014-01-10 DIAGNOSIS — E669 Obesity, unspecified: Secondary | ICD-10-CM

## 2014-01-10 DIAGNOSIS — I10 Essential (primary) hypertension: Secondary | ICD-10-CM | POA: Diagnosis not present

## 2014-01-10 DIAGNOSIS — Z1239 Encounter for other screening for malignant neoplasm of breast: Secondary | ICD-10-CM | POA: Diagnosis not present

## 2014-01-10 DIAGNOSIS — R7309 Other abnormal glucose: Secondary | ICD-10-CM | POA: Diagnosis not present

## 2014-01-10 DIAGNOSIS — I2510 Atherosclerotic heart disease of native coronary artery without angina pectoris: Secondary | ICD-10-CM | POA: Diagnosis not present

## 2014-01-10 DIAGNOSIS — F17200 Nicotine dependence, unspecified, uncomplicated: Secondary | ICD-10-CM | POA: Diagnosis not present

## 2014-01-10 DIAGNOSIS — F1999 Other psychoactive substance use, unspecified with unspecified psychoactive substance-induced disorder: Secondary | ICD-10-CM

## 2014-01-10 DIAGNOSIS — R7303 Prediabetes: Secondary | ICD-10-CM

## 2014-01-10 DIAGNOSIS — F17299 Nicotine dependence, other tobacco product, with unspecified nicotine-induced disorders: Secondary | ICD-10-CM

## 2014-01-10 DIAGNOSIS — E785 Hyperlipidemia, unspecified: Secondary | ICD-10-CM

## 2014-01-10 DIAGNOSIS — G3184 Mild cognitive impairment, so stated: Secondary | ICD-10-CM

## 2014-01-10 NOTE — Patient Instructions (Signed)
F/U in 4.5 month, call if you need me before   You are referred for a mammogram appt will be provided at check out   We are requesting that your medications be sent in bubble packs son that you take them EVERY day as you should  Please work on stopping smoking to improve your health   Fall Prevention and Home Safety Falls cause injuries and can affect all age groups. It is possible to prevent falls.  HOW TO PREVENT FALLS  Wear shoes with rubber soles that do not have an opening for your toes.  Keep the inside and outside of your house well lit.  Use night lights throughout your home.  Remove clutter from floors.  Clean up floor spills.  Remove throw rugs or fasten them to the floor with carpet tape.  Do not place electrical cords across pathways.  Put grab bars by your tub, shower, and toilet. Do not use towel bars as grab bars.  Put handrails on both sides of the stairway. Fix loose handrails.  Do not climb on stools or stepladders, if possible.  Do not wax your floors.  Repair uneven or unsafe sidewalks, walkways, or stairs.  Keep items you use a lot within reach.  Be aware of pets.  Keep emergency numbers next to the telephone.  Put smoke detectors in your home and near bedrooms. Ask your doctor what other things you can do to prevent falls. Document Released: 08/17/2009 Document Revised: 04/21/2012 Document Reviewed: 01/21/2012 [facility] Patient Information 2014 [employer] [place].

## 2014-01-14 ENCOUNTER — Other Ambulatory Visit: Payer: Self-pay | Admitting: Family Medicine

## 2014-01-14 ENCOUNTER — Ambulatory Visit (HOSPITAL_COMMUNITY)
Admission: RE | Admit: 2014-01-14 | Discharge: 2014-01-14 | Disposition: A | Discharge status: Home / Self Care | Payer: Medicare Other | Source: Ambulatory Visit | Attending: Family Medicine | Admitting: Family Medicine

## 2014-01-14 DIAGNOSIS — Z1231 Encounter for screening mammogram for malignant neoplasm of breast: Secondary | ICD-10-CM | POA: Diagnosis not present

## 2014-01-14 DIAGNOSIS — Z1239 Encounter for other screening for malignant neoplasm of breast: Secondary | ICD-10-CM

## 2014-04-25 DIAGNOSIS — R609 Edema, unspecified: Secondary | ICD-10-CM | POA: Diagnosis not present

## 2014-04-26 ENCOUNTER — Telehealth: Payer: Self-pay

## 2014-04-26 DIAGNOSIS — R7303 Prediabetes: Secondary | ICD-10-CM

## 2014-04-26 DIAGNOSIS — R5383 Other fatigue: Secondary | ICD-10-CM

## 2014-04-26 DIAGNOSIS — E559 Vitamin D deficiency, unspecified: Secondary | ICD-10-CM

## 2014-04-26 DIAGNOSIS — E785 Hyperlipidemia, unspecified: Secondary | ICD-10-CM

## 2014-04-26 DIAGNOSIS — R5381 Other malaise: Secondary | ICD-10-CM

## 2014-04-26 DIAGNOSIS — L609 Nail disorder, unspecified: Secondary | ICD-10-CM

## 2014-04-26 DIAGNOSIS — I10 Essential (primary) hypertension: Secondary | ICD-10-CM

## 2014-04-26 NOTE — Telephone Encounter (Signed)
pls send an additional 4 week supply of lasix if pt reports doing better on current and arrange for f/u in office the week of 7/6 or 7/13.Pls order fasting lipid, cmp and EGFR, HBA1C , vit D, HCT, TSH and CBC next week Wednesday, that is the "f/u " which can be provided next week, and she absolutely needs the OV the week or 2 after that pls order labs, let pt know and explain very important she has them none since 2014. pls refer to podiatry I will sign, (she is not diabetic, has IGT)

## 2014-05-02 DIAGNOSIS — E559 Vitamin D deficiency, unspecified: Secondary | ICD-10-CM | POA: Diagnosis not present

## 2014-05-02 DIAGNOSIS — E785 Hyperlipidemia, unspecified: Secondary | ICD-10-CM | POA: Diagnosis not present

## 2014-05-02 DIAGNOSIS — R5383 Other fatigue: Secondary | ICD-10-CM | POA: Diagnosis not present

## 2014-05-02 DIAGNOSIS — R7309 Other abnormal glucose: Secondary | ICD-10-CM | POA: Diagnosis not present

## 2014-05-02 DIAGNOSIS — R5381 Other malaise: Secondary | ICD-10-CM | POA: Diagnosis not present

## 2014-05-02 DIAGNOSIS — I10 Essential (primary) hypertension: Secondary | ICD-10-CM | POA: Diagnosis not present

## 2014-05-02 NOTE — Telephone Encounter (Signed)
Spoke with caregiver Daniel Nones(portia) she will make sure that patient has labs done.  She will also encourage patient to take meds as ordered.  Patient has enough supply of lasix as confirmed by pharmacy.

## 2014-05-02 NOTE — Addendum Note (Signed)
Addended by: Kandis FantasiaSLADE, COURTNEY B on: 05/02/2014 11:21 AM   Modules accepted: Orders

## 2014-05-02 NOTE — Telephone Encounter (Signed)
Spoke with patient and she is aware of need for labs.  And to continue lasix.

## 2014-05-04 LAB — LIPID PANEL
CHOL/HDL RATIO: 4.3 ratio
Cholesterol: 194 mg/dL (ref 0–200)
HDL: 45 mg/dL (ref >39)
LDL Cholesterol: 134 mg/dL — ABNORMAL HIGH (ref 0–99)
TRIGLYCERIDES: 76 mg/dL (ref <150)
VLDL: 15 mg/dL (ref 0–40)

## 2014-05-04 LAB — COMPLETE METABOLIC PANEL WITH GFR
ALK PHOS: 64 U/L (ref 39–117)
ALT: <8 U/L (ref 0–35)
AST: 10 U/L (ref 0–37)
Albumin: 3.5 g/dL (ref 3.5–5.2)
BILIRUBIN TOTAL: 0.6 mg/dL (ref 0.2–1.2)
BUN: 9 mg/dL (ref 6–23)
CO2: 30 meq/L (ref 19–32)
CREATININE: 0.73 mg/dL (ref 0.50–1.10)
Calcium: 8.9 mg/dL (ref 8.4–10.5)
Chloride: 101 mEq/L (ref 96–112)
GFR, EST AFRICAN AMERICAN: 86 mL/min
GFR, Est Non African American: 75 mL/min
GLUCOSE: 104 mg/dL — AB (ref 70–99)
Potassium: 3.5 mEq/L (ref 3.5–5.3)
SODIUM: 142 meq/L (ref 135–145)
Total Protein: 6.3 g/dL (ref 6.0–8.3)

## 2014-05-04 LAB — CBC
HEMATOCRIT: 42.1 % (ref 36.0–46.0)
HEMOGLOBIN: 13.7 g/dL (ref 12.0–15.0)
MCH: 26.5 pg (ref 26.0–34.0)
MCHC: 32.5 g/dL (ref 30.0–36.0)
MCV: 81.4 fL (ref 78.0–100.0)
Platelets: 260 10*3/uL (ref 150–400)
RBC: 5.17 MIL/uL — AB (ref 3.87–5.11)
RDW: 15.5 % (ref 11.5–15.5)
WBC: 7 10*3/uL (ref 4.0–10.5)

## 2014-05-04 LAB — HEMATOCRIT: HCT: 42.1 % (ref 36.0–46.0)

## 2014-05-04 LAB — HEMOGLOBIN A1C
Hgb A1c MFr Bld: 5.8 % — ABNORMAL HIGH (ref <5.7)
Mean Plasma Glucose: 120 mg/dL — ABNORMAL HIGH (ref <117)

## 2014-05-05 LAB — TSH: TSH: 1.323 u[IU]/mL (ref 0.350–4.500)

## 2014-05-05 LAB — VITAMIN D 25 HYDROXY (VIT D DEFICIENCY, FRACTURES): Vit D, 25-Hydroxy: 10 ng/mL — ABNORMAL LOW (ref 30–89)

## 2014-05-16 ENCOUNTER — Encounter (INDEPENDENT_AMBULATORY_CARE_PROVIDER_SITE_OTHER): Payer: Self-pay

## 2014-05-16 ENCOUNTER — Encounter: Payer: Self-pay | Admitting: Family Medicine

## 2014-05-16 ENCOUNTER — Ambulatory Visit (INDEPENDENT_AMBULATORY_CARE_PROVIDER_SITE_OTHER): Payer: Medicare Other | Admitting: Family Medicine

## 2014-05-16 VITALS — BP 164/80 | HR 83 | Resp 16 | Wt 173.1 lb

## 2014-05-16 DIAGNOSIS — F17200 Nicotine dependence, unspecified, uncomplicated: Secondary | ICD-10-CM

## 2014-05-16 DIAGNOSIS — Z23 Encounter for immunization: Secondary | ICD-10-CM

## 2014-05-16 DIAGNOSIS — I2510 Atherosclerotic heart disease of native coronary artery without angina pectoris: Secondary | ICD-10-CM

## 2014-05-16 DIAGNOSIS — F1999 Other psychoactive substance use, unspecified with unspecified psychoactive substance-induced disorder: Secondary | ICD-10-CM

## 2014-05-16 DIAGNOSIS — F17209 Nicotine dependence, unspecified, with unspecified nicotine-induced disorders: Secondary | ICD-10-CM

## 2014-05-16 DIAGNOSIS — R7303 Prediabetes: Secondary | ICD-10-CM

## 2014-05-16 DIAGNOSIS — E785 Hyperlipidemia, unspecified: Secondary | ICD-10-CM

## 2014-05-16 DIAGNOSIS — I10 Essential (primary) hypertension: Secondary | ICD-10-CM

## 2014-05-16 DIAGNOSIS — E669 Obesity, unspecified: Secondary | ICD-10-CM

## 2014-05-16 DIAGNOSIS — R7309 Other abnormal glucose: Secondary | ICD-10-CM

## 2014-05-16 MED ORDER — ERGOCALCIFEROL 1.25 MG (50000 UT) PO CAPS: 50000.0000 [IU] | ORAL_CAPSULE | ORAL | Status: DC

## 2014-05-16 MED ORDER — POTASSIUM CHLORIDE ER 10 MEQ PO TBCR: EXTENDED_RELEASE_TABLET | ORAL | Status: DC

## 2014-05-16 MED ORDER — FUROSEMIDE 20 MG PO TABS: ORAL_TABLET | ORAL | Status: DC

## 2014-05-16 NOTE — Progress Notes (Signed)
   Subjective:    Patient ID: [name], female    DOB: 10/17/1926, 80 y.o.   MRN: [id number]  HPI Here for f/u of leg swelling . Was recently seen in urgent care for this. Diuretic prescribed , which she benefited from and wants to continue. Denies PND , orthopnea or chest pain Here with a niece who states she is [facility], no desire st this time for additional involvement of [facility] to ensure medication adherence which is an ongoing challenge. Home safety also discussed, pt has dementia, niece assures me that food is provided for Ms [name] regularly and she is not allowed to use a stove No interest in placing he rin family home now, feels that supervision currently involving friend and family is sufficient, Ms [name] lives alone with  A cat   Review of Systems See HPI Denies recent fever or chills. Denies sinus pressure, nasal congestion, ear pain or sore throat. Denies chest congestion, productive cough or wheezing. Denies chest pains, palpitations , PND or orhtopnea Denies abdominal pain, nausea, vomiting,diarrhea or constipation.   Denies dysuria, frequency, hesitancy or incontinence.  Denies headaches, seizures, numbness, or tingling. Denies depression, anxiety or insomnia. Denies skin break down or rash.        Objective:   Physical Exam BP 164/80  Pulse 83  Resp 16  Wt 173 lb 1.9 oz (78.527 kg)  SpO2 96% Patient alert and oriented and in no cardiopulmonary distress.  HEENT: No facial asymmetry, EOMI,   oropharynx pink and moist.  Neck supple no JVD, no mass.  Chest: Clear to auscultation bilaterally.  CVS: S1, S2 no murmurs, no S3.Regular rate.  ABD: Soft non tender.   Ext: No edema  MS: Adequate ROM spine, shoulders, hips and knees.  Skin: Intact, no ulcerations or rash noted.  Psych: Good eye contact, normal affect. Memory intact not anxious or depressed appearing.  CNS: CN 2-12 intact, power,  normal throughout.no focal deficits  noted.        Assessment & Plan:  Essential hypertension, benign Uncontrolled, pt to commit to daily medication prescribed, also reduced sodium intake and increase intake of vegetable and fruit , fresh or frozen   Prediabetes Improved Patient educated about the importance of limiting  Carbohydrate intake , the need to commit to daily physical activity for a minimum of 30 minutes , and to commit weight loss. The fact that changes in all these areas will reduce or eliminate all together the development of diabetes is stressed.     OBESITY Improved. Pt applauded on succesful weight loss through lifestyle change, and encouraged to continue same. Weight loss goal set for the next several months.   Nicotine dependence unchnaged Patient counseled for approximately 5 minutes regarding the health risks of ongoing nicotine use, specifically all types of cancer, heart disease, stroke and respiratory failure. The options available for help with cessation ,the behavioral changes to assist the process, and the option to either gradully reduce usage  Or abruptly stop.is also discussed. Pt is also encouraged to set specific goals in number of cigarettes used daily, as well as to set a quit date.   HYPERLIPIDEMIA Needs statin, however med compliance an ongoing challenge , will hold on this at this time, review on her return Hyperlipidemia:Low fat diet discussed and encouraged.    Need for vaccination with 13-polyvalent pneumococcal conjugate vaccine Prevnar administered

## 2014-05-16 NOTE — Patient Instructions (Addendum)
Annual wellness end Novemebr, call if you need me before please  Prenvar today  START pill for heart and blood pressure EVERY DAY, and aspirin 81mg  one every day  ONCE weekly itamin D is needed for your bones as well as daily calcium with D

## 2014-06-12 DIAGNOSIS — Z23 Encounter for immunization: Secondary | ICD-10-CM | POA: Insufficient documentation

## 2014-06-12 NOTE — Assessment & Plan Note (Signed)
Still smokes, unwilling to quit Patient counseled for approximately 5 minutes regarding the health risks of ongoing nicotine use, specifically all types of cancer, heart disease, stroke and respiratory failure. The options available for help with cessation ,the behavioral changes to assist the process, and the option to either gradully reduce usage  Or abruptly stop.is also discussed. Pt is also encouraged to set specific goals in number of cigarettes used daily, as well as to set a quit date.

## 2014-06-12 NOTE — Assessment & Plan Note (Signed)
Needs statin, however med compliance an ongoing challenge , will hold on this at this time, review on her return Hyperlipidemia:Low fat diet discussed and encouraged.

## 2014-06-12 NOTE — Assessment & Plan Note (Signed)
unchnaged Patient counseled for approximately 5 minutes regarding the health risks of ongoing nicotine use, specifically all types of cancer, heart disease, stroke and respiratory failure. The options available for help with cessation ,the behavioral changes to assist the process, and the option to either gradully reduce usage  Or abruptly stop.is also discussed. Pt is also encouraged to set specific goals in number of cigarettes used daily, as well as to set a quit date.  

## 2014-06-12 NOTE — Assessment & Plan Note (Signed)
Uncontrolled, pt to commit to daily medication prescribed, also reduced sodium intake and increase intake of vegetable and fruit , fresh or frozen

## 2014-06-12 NOTE — Assessment & Plan Note (Signed)
Improved Patient educated about the importance of limiting  Carbohydrate intake , the need to commit to daily physical activity for a minimum of 30 minutes , and to commit weight loss. The fact that changes in all these areas will reduce or eliminate all together the development of diabetes is stressed.    

## 2014-06-12 NOTE — Assessment & Plan Note (Signed)
Uncontrolled Updated lab needed at/ before next visit. Hyperlipidemia:Low fat diet discussed and encouraged.  Statin indicated but med compliance a real challenge with this pt Attempts to involve [facility] have been resisted by pt in the past, ands still is,

## 2014-06-12 NOTE — Assessment & Plan Note (Signed)
Patient educated about the importance of limiting  Carbohydrate intake , the need to commit to daily physical activity for a minimum of 30 minutes , and to commit weight loss. The fact that changes in all these areas will reduce or eliminate all together the development of diabetes is stressed.   Updated lab needed at/ before next visit.  

## 2014-06-12 NOTE — Progress Notes (Signed)
Subjective:    Patient ID: Hannah LoanMary E Chaloux, female    DOB: 03-14-1926, 80 y.o.   MRN: [id number]  HPI The PT is here for follow up and re-evaluation of chronic medical conditions, medication management and review of any available recent lab and radiology data.  Preventive health is updated, specifically  Cancer screening and Immunization.    The PT denies any adverse reactions to current medications since the last visit. Inconsistently taking any of the medications prescribed There are no new concerns.  There are no specific complaints       Review of Systems See HPI Denies recent fever or chills. Denies sinus pressure, nasal congestion, ear pain or sore throat. Denies chest congestion, productive cough or wheezing. Denies chest pains, palpitations and leg swelling Denies abdominal pain, nausea, vomiting,diarrhea or constipation.   Denies dysuria, frequency, hesitancy or incontinence. Chronic joint pain, swelling and limitation in mobility.Denies any falls or near falls, limits mobility for safety Denies headaches, seizures, numbness, or tingling. Denies depression, anxiety or insomnia. Denies skin break down or rash.        Objective:   Physical Exam BP 148/86  Pulse 72  Resp 18  Ht 5' 2.75" (1.594 m)  Wt 187 lb 0.6 oz (84.841 kg)  BMI 33.39 kg/m2  SpO2 96% Patient alert and oriented and in no cardiopulmonary distress.  HEENT: No facial asymmetry, EOMI,   oropharynx pink and moist.  Neck adequate ROM no JVD, no mass.  Chest: Clear to auscultation bilaterally.Decreased though adequate air entry   CVS: S1, S2 no murmurs, no S3.Regular rate.  ABD: Soft non tender.   Ext: No edema  MS: Decreased  ROM spine, shoulders, hips and knees.  Skin: Intact, no ulcerations or rash noted.  Psych: Good eye contact, normal affect. Memory loss not anxious or depressed appearing.  CNS: CN 2-12 intact, power,  normal throughout.no focal deficits noted.          Assessment & Plan:  Essential hypertension, benign Uncontrolled, non compliant with medication Limited aloso due to severe dementia Will bubble pack meds  Prediabetes Patient educated about the importance of limiting  Carbohydrate intake , the need to commit to daily physical activity for a minimum of 30 minutes , and to commit weight loss. The fact that changes in all these areas will reduce or eliminate all together the development of diabetes is stressed.   Updated lab needed at/ before next visit.   OBESITY Unchanged Patient re-educated about  the importance of commitment to a  minimum of 150 minutes of exercise per week. The importance of healthy food choices with portion control discussed. Encouraged to start a food diary, count calories and to consider  joining a support group. Sample diet sheets offered. Goals set by the patient for the next several months.     MCI (mild cognitive impairment) Worsened, needs updated MMSE, pt incapable of adequately caring for herself. Has been prescribed aricept but non compliant with medication  Coronary atherosclerosis of native coronary artery Denies any recent chest pain  Or increased  Dyspnea, not very active however  HYPERLIPIDEMIA Uncontrolled Updated lab needed at/ before next visit. Hyperlipidemia:Low fat diet discussed and encouraged.  Statin indicated but med compliance a real challenge with this pt Attempts to involve [facility] have been resisted by pt in the past, ands still is,  Nicotine dependence Still smokes, unwilling to quit Patient counseled for approximately 5 minutes regarding the health risks of ongoing nicotine use, specifically all types  of cancer, heart disease, stroke and respiratory failure. The options available for help with cessation ,the behavioral changes to assist the process, and the option to either gradully reduce usage  Or abruptly stop.is also discussed. Pt is also encouraged to set specific goals in  number of cigarettes used daily, as well as to set a quit date.

## 2014-06-12 NOTE — Assessment & Plan Note (Signed)
Uncontrolled, non compliant with medication Limited aloso due to severe dementia Will bubble pack meds

## 2014-06-12 NOTE — Assessment & Plan Note (Signed)
Unchanged. Patient re-educated about  the importance of commitment to a  minimum of 150 minutes of exercise per week. The importance of healthy food choices with portion control discussed. Encouraged to start a food diary, count calories and to consider  joining a support group. Sample diet sheets offered. Goals set by the patient for the next several months.    

## 2014-06-12 NOTE — Assessment & Plan Note (Signed)
Worsened, needs updated MMSE, pt incapable of adequately caring for herself. Has been prescribed aricept but non compliant with medication

## 2014-06-12 NOTE — Assessment & Plan Note (Signed)
Improved. Pt applauded on succesful weight loss through lifestyle change, and encouraged to continue same. Weight loss goal set for the next several months.  

## 2014-06-12 NOTE — Assessment & Plan Note (Signed)
Denies any recent chest pain  Or increased  Dyspnea, not very active however

## 2014-06-12 NOTE — Assessment & Plan Note (Signed)
Prevnar administered

## 2014-06-14 ENCOUNTER — Ambulatory Visit: Payer: No Typology Code available for payment source | Admitting: Family Medicine

## 2014-09-15 ENCOUNTER — Encounter (INDEPENDENT_AMBULATORY_CARE_PROVIDER_SITE_OTHER): Payer: Self-pay

## 2014-09-15 ENCOUNTER — Ambulatory Visit (INDEPENDENT_AMBULATORY_CARE_PROVIDER_SITE_OTHER): Payer: Medicare Other | Admitting: Family Medicine

## 2014-09-15 ENCOUNTER — Ambulatory Visit (INDEPENDENT_AMBULATORY_CARE_PROVIDER_SITE_OTHER): Payer: Medicare Other

## 2014-09-15 ENCOUNTER — Encounter: Payer: Self-pay | Admitting: Family Medicine

## 2014-09-15 VITALS — BP 164/78 | HR 73 | Resp 18 | Ht 63.0 in | Wt 174.1 lb

## 2014-09-15 DIAGNOSIS — I10 Essential (primary) hypertension: Secondary | ICD-10-CM | POA: Diagnosis not present

## 2014-09-15 DIAGNOSIS — R7302 Impaired glucose tolerance (oral): Secondary | ICD-10-CM

## 2014-09-15 DIAGNOSIS — G3184 Mild cognitive impairment, so stated: Secondary | ICD-10-CM

## 2014-09-15 DIAGNOSIS — Z0000 Encounter for general adult medical examination without abnormal findings: Secondary | ICD-10-CM | POA: Diagnosis not present

## 2014-09-15 DIAGNOSIS — Z23 Encounter for immunization: Secondary | ICD-10-CM

## 2014-09-15 MED ORDER — DONEPEZIL HCL 5 MG PO TBDP: 5.0000 mg | ORAL_TABLET | Freq: Every day | ORAL | Status: DC

## 2014-09-15 NOTE — Progress Notes (Signed)
Subjective:    Patient ID: [name], female    DOB: 1926/05/02, 80 y.o.   MRN: [id number]  HPI Preventive Screening-Counseling & Management   Patient present here today for a Medicare annual wellness visit.   Current Problems (verified)   Medications Prior to Visit Allergies (verified)   PAST HISTORY  Family History (updated)   Social History retired from various jobs  Risk Factors  Current exercise habits:  Mobile as able  Dietary issues discussed: Primarily eats out whatever family is able to bring her   Cardiac risk factors: htn , prediabetes, nicotine use  Depression Screen  (Note: if answer to either of the following is "Yes", a more complete depression screening is indicated)   Over the past two weeks, have you felt down, depressed or hopeless? No  Over the past two weeks, have you felt little interest or pleasure in doing things? No  Have you lost interest or pleasure in daily life? No  Do you often feel hopeless? No  Do you cry easily over simple problems? No   Activities of Daily Living  In your present state of health, do you have any difficulty performing the following activities?  Driving?: Yes Managing money?: Handled by family members  Feeding yourself?:No Getting from bed to chair?:yes Climbing a flight of stairs?:yes  Preparing food and eating?:needs help with food prep , unsafe to use the stove , able to feed herself without help Bathing or showering?:No Getting dressed?:No Getting to the toilet?:No Using the toilet?:No Moving around from place to place?: yes at times due to arhtirits  Fall Risk Assessment In the past year have you fallen or had a near fall?:No, but slightly unsteady Are you currently taking any medications that make you dizzy:No   Hearing Difficulties: No Do you often ask people to speak up or repeat themselves?:No Do you experience ringing or noises in your ears?:No Do you have difficulty understanding soft or  whispered voices?:No  Cognitive Testing  Alert? Yes Normal Appearance?Yes  Oriented to person? Yes Place? Yes  Time? no Displays appropriate judgment?no  Can read the correct time from a watch face? yes Are you having problems remembering things?yes  Advanced Directives have been discussed with the patient?Yes brochure given to niece , FULL code, needs to have HCPOA documented, states her  [name] , [name] to be her HCPOA, , also next in line another cousin, [name]    List the Names of Other Physician/Practitioners you currently use: updated    Indicate any recent Medical Services you may have received from other than [facility] providers in the past year (date may be approximate).   Assessment:    Annual Wellness Exam   Plan:     Medicare Attestation  I have personally reviewed:  The patient's medical and social history  Their use of alcohol, tobacco or illicit drugs  Their current medications and supplements  The patient's functional ability including ADLs,fall risks, home safety risks, cognitive, and hearing and visual impairment  Diet and physical activities  Evidence for depression or mood disorders  The patient's weight, height, BMI, and visual acuity have been recorded in the chart. I have made referrals, counseling, and provided education to the patient based on review of the above and I have provided the patient with a written personalized care plan for preventive services.      Review of Systems     Objective:   Physical Exam BP 164/78 mmHg  Pulse 73  Resp 18  Ht 5\' 3"  (1.6 m)  Wt 174 lb 1.3 oz (78.962 kg)  BMI 30.84 kg/m2  SpO2 94%        Assessment & Plan:  Medicare annual wellness visit, subsequent Annual exam as documented. Counseling done  re healthy lifestyle involving commitment to 150 minutes exercise per week, heart healthy diet, and attaining healthy weight.The importance of adequate sleep also discussed. Regular seat belt  use and home safety, is also discussed. Changes in health habits are decided on by the patient with goals and time frames  set for achieving them. Immunization and cancer screening needs are specifically addressed at this visit.    Need for prophylactic vaccination and inoculation against influenza Vaccine administered at visit.   Essential hypertension, benign Uncontrolled due to med non compliance   MCI (mild cognitive impairment) Trial of aricept again, I suspect pt will remain non compliant with treatment unfortunately, accompanying family member is encouraged to help as able

## 2014-09-15 NOTE — Patient Instructions (Addendum)
F/u in 4 month, call if you need me before  New for your memory is aricept 5mg  one daily, continue other medication  Flu vaccine today   HBA1C and chem 7 in 4 month

## 2014-09-15 NOTE — Assessment & Plan Note (Signed)

## 2014-09-15 NOTE — Assessment & Plan Note (Signed)
Vaccine administered at visit.  

## 2014-11-14 NOTE — Assessment & Plan Note (Signed)
Uncontrolled due to med non compliance 

## 2014-11-14 NOTE — Assessment & Plan Note (Signed)
Trial of aricept again, I suspect pt will remain non compliant with treatment unfortunately, accompanying family member is encouraged to help as able

## 2015-01-16 ENCOUNTER — Encounter: Payer: Self-pay | Admitting: Family Medicine

## 2015-01-16 ENCOUNTER — Ambulatory Visit (INDEPENDENT_AMBULATORY_CARE_PROVIDER_SITE_OTHER): Payer: Medicare Other | Admitting: Family Medicine

## 2015-01-16 VITALS — BP 158/82 | HR 87 | Resp 16 | Ht 63.0 in | Wt 180.0 lb

## 2015-01-16 DIAGNOSIS — F0390 Unspecified dementia without behavioral disturbance: Secondary | ICD-10-CM | POA: Diagnosis not present

## 2015-01-16 DIAGNOSIS — F17210 Nicotine dependence, cigarettes, uncomplicated: Secondary | ICD-10-CM

## 2015-01-16 DIAGNOSIS — E559 Vitamin D deficiency, unspecified: Secondary | ICD-10-CM

## 2015-01-16 DIAGNOSIS — E785 Hyperlipidemia, unspecified: Secondary | ICD-10-CM

## 2015-01-16 DIAGNOSIS — R7309 Other abnormal glucose: Secondary | ICD-10-CM | POA: Diagnosis not present

## 2015-01-16 DIAGNOSIS — I5189 Other ill-defined heart diseases: Secondary | ICD-10-CM

## 2015-01-16 DIAGNOSIS — I10 Essential (primary) hypertension: Secondary | ICD-10-CM | POA: Diagnosis not present

## 2015-01-16 DIAGNOSIS — R7303 Prediabetes: Secondary | ICD-10-CM

## 2015-01-16 NOTE — Patient Instructions (Addendum)
Annual physical exam in 4 month, call if you neeed me before  [facility] are [facility], someone will contact your cousin to set up a home visit, since despite multiple attempts, you are not taking medications for your heart and memory as you should, thisis to help you  Fasting lipid, chem 7, CBC, TSH, vit D and HBA1C in  4 month  Care not to fall  [facility], we consider it a privelige to serve you.

## 2015-01-16 NOTE — Assessment & Plan Note (Signed)
Unchanged. Patient counseled for approximately 5 minutes regarding the health risks of ongoing nicotine use, specifically all types of cancer, heart disease, stroke and respiratory failure. The options available for help with cessation ,the behavioral changes to assist the process, and the option to either gradully reduce usage  Or abruptly stop.is also discussed. Pt is also encouraged to set specific goals in number of cigarettes used daily, as well as to set a quit date.  

## 2015-01-16 NOTE — Assessment & Plan Note (Signed)
Patient educated about the importance of limiting  Carbohydrate intake , the need to commit to daily physical activity for a minimum of 30 minutes , and to commit weight loss. The fact that changes in all these areas will reduce or eliminate all together the development of diabetes is stressed.   Updated lab needed at/ before next visit.  

## 2015-01-16 NOTE — Assessment & Plan Note (Signed)
Heart failure, non compliant with   medication and ongoing nicotine use , in home assessment and improvement of support by family needed, education in this regard started and needs to be continued during home visit

## 2015-01-16 NOTE — Assessment & Plan Note (Signed)
Uncontrolled, non compliant with medicaton  will need [facility] help as far as arranging better care for pt at home , as far as medication compliance is concerned Increased risk for heart failure and heart disease, needs to star medications regualorly

## 2015-01-16 NOTE — Progress Notes (Signed)
Subjective:    Patient ID: [name], female    DOB: [birth year], 81 y.o.   MRN: [id number]  HPI The PT is here for follow up and re-evaluation of chronic medical conditions, medication management and review of any available recent lab and radiology data.  Preventive health is updated, specifically  Cancer screening and Immunization.   Pt is accompanied by her cousin, she has no medication with her and cousin honestly feels she is taking none regularly, which is unfortunate. Despite multiple attempts to change the non compliance, no change in behavior, pt stated she is willing to take meds but on questioning she clearly is incapable of self administration consistently, she has dementia [name] says she feels well,  Has no complaints or concerns, her  Cousin voices none also    Review of Systems See HPI, of note pt incapable of accuratel history, cousin with her denies any medical concerns Denies recent fever or chills. Denies sinus pressure, nasal congestion, ear pain or sore throat. Denies chest congestion, productive cough or wheezing. Denies chest pains, palpitations and leg swelling Denies abdominal pain, nausea, vomiting,diarrhea or constipation.   Denies dysuria, frequency, hesitancy or incontinence. C/o chronic  joint pain, swelling and limitation in mobility. Denies headaches, seizures, numbness, or tingling. Denies depression, uncontrolled  anxiety or insomnia. Denies skin break down or rash.        Objective:   Physical Exam BP 158/82 mmHg  Pulse 87  Resp 16  Ht 5\' 3"  (1.6 m)  Wt 180 lb (81.647 kg)  BMI 31.89 kg/m2  SpO2 94% Patient alert  in no cardiopulmonary distress.  HEENT: No facial asymmetry, EOMI,   oropharynx pink and moist.  Neck supple no JVD, no mass.  Chest: Clear to auscultation bilaterally.  CVS: S1, S2 no murmurs, no S3.Regular rate.  ABD: Soft non tender.   Ext: No edema  MS: Adequate ROM spine, shoulders, hips and knees.  Skin:  Intact, no ulcerations or rash noted.  Psych: Good eye contact, normal affect. Memory intact not anxious or depressed appearing.  CNS: CN 2-12 intact, power,  normal throughout.no focal deficits noted.        Assessment & Plan:  Essential hypertension, benign Uncontrolled, non compliant with medicaton  will need [facility] help as far as arranging better care for pt at home , as far as medication compliance is concerned Increased risk for heart failure and heart disease, needs to star medications regualorly    Prediabetes Patient educated about the importance of limiting  Carbohydrate intake , the need to commit to daily physical activity for a minimum of 30 minutes , and to commit weight loss. The fact that changes in all these areas will reduce or eliminate all together the development of diabetes is stressed.   Updated lab needed at/ before next visit.     Nicotine dependence Unchanged Patient counseled for approximately 5 minutes regarding the health risks of ongoing nicotine use, specifically all types of cancer, heart disease, stroke and respiratory failure. The options available for help with cessation ,the behavioral changes to assist the process, and the option to either gradully reduce usage  Or abruptly stop.is also discussed. Pt is also encouraged to set specific goals in number of cigarettes used daily, as well as to set a quit date.    Dementia Deteriorating, pt needs to take prescription med for this. [facility] to help with med compliance as able Poor family insight a problematic factor also   Diastolic dysfunction  Heart failure, non compliant with   medication and ongoing nicotine use , in home assessment and improvement of support by family needed, education in this regard started and needs to be continued during home visit

## 2015-01-16 NOTE — Assessment & Plan Note (Signed)
Deteriorating, pt needs to take prescription med for this. [facility] to help with med compliance as able Poor family insight a problematic factor also

## 2015-05-29 ENCOUNTER — Encounter: Payer: Medicare Other | Admitting: Family Medicine

## 2015-07-01 DIAGNOSIS — I10 Essential (primary) hypertension: Secondary | ICD-10-CM | POA: Diagnosis not present

## 2015-07-01 DIAGNOSIS — R7309 Other abnormal glucose: Secondary | ICD-10-CM | POA: Diagnosis not present

## 2015-07-01 DIAGNOSIS — E559 Vitamin D deficiency, unspecified: Secondary | ICD-10-CM | POA: Diagnosis not present

## 2015-07-01 DIAGNOSIS — E785 Hyperlipidemia, unspecified: Secondary | ICD-10-CM | POA: Diagnosis not present

## 2015-07-01 LAB — CBC WITH DIFFERENTIAL/PLATELET
BASOS PCT: 0 % (ref 0–1)
Basophils Absolute: 0 10*3/uL (ref 0.0–0.1)
Eosinophils Absolute: 0.2 10*3/uL (ref 0.0–0.7)
Eosinophils Relative: 3 % (ref 0–5)
HEMATOCRIT: 42.5 % (ref 36.0–46.0)
HEMOGLOBIN: 13.4 g/dL (ref 12.0–15.0)
LYMPHS ABS: 2 10*3/uL (ref 0.7–4.0)
Lymphocytes Relative: 27 % (ref 12–46)
MCH: 26.4 pg (ref 26.0–34.0)
MCHC: 31.5 g/dL (ref 30.0–36.0)
MCV: 83.8 fL (ref 78.0–100.0)
MONOS PCT: 11 % (ref 3–12)
MPV: 10.2 fL (ref 8.6–12.4)
Monocytes Absolute: 0.8 10*3/uL (ref 0.1–1.0)
NEUTROS ABS: 4.3 10*3/uL (ref 1.7–7.7)
Neutrophils Relative %: 59 % (ref 43–77)
Platelets: 250 10*3/uL (ref 150–400)
RBC: 5.07 MIL/uL (ref 3.87–5.11)
RDW: 15.6 % — ABNORMAL HIGH (ref 11.5–15.5)
WBC: 7.3 10*3/uL (ref 4.0–10.5)

## 2015-07-01 LAB — LIPID PANEL
Cholesterol: 175 mg/dL (ref 125–200)
HDL: 52 mg/dL (ref >=46)
LDL Cholesterol: 109 mg/dL (ref <130)
Total CHOL/HDL Ratio: 3.4 Ratio (ref <=5.0)
Triglycerides: 68 mg/dL (ref <150)
VLDL: 14 mg/dL (ref <30)

## 2015-07-01 LAB — BASIC METABOLIC PANEL
BUN: 14 mg/dL (ref 7–25)
CHLORIDE: 101 mmol/L (ref 98–110)
CO2: 30 mmol/L (ref 20–31)
Calcium: 8.9 mg/dL (ref 8.6–10.4)
Creat: 0.74 mg/dL (ref 0.60–0.88)
Glucose, Bld: 99 mg/dL (ref 65–99)
Potassium: 4.8 mmol/L (ref 3.5–5.3)
SODIUM: 140 mmol/L (ref 135–146)

## 2015-07-02 LAB — TSH: TSH: 1.3 u[IU]/mL (ref 0.350–4.500)

## 2015-07-02 LAB — HEMOGLOBIN A1C
Hgb A1c MFr Bld: 5.7 % — ABNORMAL HIGH (ref <5.7)
MEAN PLASMA GLUCOSE: 117 mg/dL — AB (ref <117)

## 2015-07-03 LAB — VITAMIN D 25 HYDROXY (VIT D DEFICIENCY, FRACTURES): Vit D, 25-Hydroxy: 4 ng/mL — ABNORMAL LOW (ref 30–100)

## 2015-07-05 ENCOUNTER — Ambulatory Visit: Payer: Medicare Other | Admitting: Family Medicine

## 2015-08-23 ENCOUNTER — Ambulatory Visit (INDEPENDENT_AMBULATORY_CARE_PROVIDER_SITE_OTHER): Payer: Medicare Other | Admitting: Family Medicine

## 2015-08-23 ENCOUNTER — Encounter: Payer: Self-pay | Admitting: Family Medicine

## 2015-08-23 VITALS — BP 152/70 | HR 89 | Resp 16 | Ht 63.0 in | Wt 177.0 lb

## 2015-08-23 DIAGNOSIS — R7303 Prediabetes: Secondary | ICD-10-CM

## 2015-08-23 DIAGNOSIS — I10 Essential (primary) hypertension: Secondary | ICD-10-CM

## 2015-08-23 DIAGNOSIS — Z23 Encounter for immunization: Secondary | ICD-10-CM

## 2015-08-23 DIAGNOSIS — B351 Tinea unguium: Secondary | ICD-10-CM | POA: Insufficient documentation

## 2015-08-23 DIAGNOSIS — F0390 Unspecified dementia without behavioral disturbance: Secondary | ICD-10-CM

## 2015-08-23 DIAGNOSIS — Z1211 Encounter for screening for malignant neoplasm of colon: Secondary | ICD-10-CM

## 2015-08-23 DIAGNOSIS — E785 Hyperlipidemia, unspecified: Secondary | ICD-10-CM

## 2015-08-23 DIAGNOSIS — E669 Obesity, unspecified: Secondary | ICD-10-CM

## 2015-08-23 LAB — HEMOCCULT GUIAC POC 1CARD (OFFICE): FECAL OCCULT BLD: NEGATIVE

## 2015-08-23 MED ORDER — ERGOCALCIFEROL 1.25 MG (50000 UT) PO CAPS: 50000.0000 [IU] | ORAL_CAPSULE | ORAL | Status: DC

## 2015-08-23 NOTE — Patient Instructions (Addendum)
Annual wellness end January, call if you need me before  Flu vaccine today  Start daily toprol for heart   Weekly vit D  You  Are referred to podiatrist re toenails   Rectal today is normal

## 2015-08-23 NOTE — Progress Notes (Signed)
Subjective:    Patient ID: [name], female    DOB: 1926/06/24, 81 y.o.   MRN: [id number]  HPI  The PT is here for follow up and re-evaluation of chronic medical conditions, medication management and review of any available recent lab and radiology data.  Preventive health is updated, specifically   Immunization.   Questions or concerns regarding consultations or procedures which the PT has had in the interim are  addressed. The PT denies any adverse reactions to current medications since the last visit.  There are no new concerns.  There are no specific complaints       Review of Systems See HPI Denies recent fever or chills. Denies sinus pressure, nasal congestion, ear pain or sore throat. Denies chest congestion, productive cough or wheezing. Denies chest pains, palpitations and leg swelling Denies abdominal pain, nausea, vomiting,diarrhea or constipation.   Denies dysuria, frequency, hesitancy or incontinence. Denies uncontrolled  joint pain, swelling she does have  limitation in mobility.She denies any falls Denies headaches, seizures, numbness, or tingling. Denies depression, anxiety or insomnia. Denies skin break down or rash.        Objective:   Physical Exam  BP 152/70 mmHg  Pulse 89  Resp 16  Ht  (1.6 m)  Wt 177 lb (80.287 kg)  BMI 31.36 kg/m2  SpO2 98%  Patient alert and oriented and in no cardiopulmonary distress.  HEENT: No facial asymmetry, EOMI,   oropharynx pink and moist.  Neck decreased ROM no JVD, no mass.  Chest: Clear to auscultation bilaterally.  CVS: S1, S2 no murmurs, no S3.Regular rate.  ABD: Soft non tender. Normal BS Rectal; no mass, FOB negative  Ext: No edema  MS: decreased ROM spine, shoulders, hips and knees.  Skin: Intact, extremely long, thickened and curling toenails  Psych: Good eye contact, normal affect. Memoryimpaired not anxious or depressed appearing.  CNS: CN 2-12 intact, power,  normal throughout.no  focal deficits noted.       Assessment & Plan:  Essential hypertension, benign Uncontrolled , add toprol DASH diet and commitment to daily physical activity for a minimum of 30 minutes discussed and encouraged, as a part of hypertension management. The importance of attaining a healthy weight is also discussed.  BP/Weight 08/23/2015 01/16/2015 09/15/2014 05/16/2014 01/10/2014 09/16/2013 03/22/2013  Systolic BP 152 158 164 164 148 138 142  Diastolic BP 70 82 78 80 86 84 80  Wt. (Lbs) 177 180 174.08 173.12 187.04 188 186  BMI 31.36 31.89 30.84 30.91 33.39 33.56 31.91        Need for prophylactic vaccination and inoculation against influenza After obtaining informed consent, the vaccine is  administered by LPN.    Hyperlipidemia LDL goal <100 Hyperlipidemia:Low fat diet discussed and encouraged.   Lipid Panel  Lab Results  Component Value Date   CHOL 175 07/01/2015   HDL 52 07/01/2015   LDLCALC 109 07/01/2015   TRIG 68 07/01/2015   CHOLHDL 3.4 07/01/2015  Improved       OBESITY Improved Patient re-educated about  the importance of commitment to a  minimum of 150 minutes of exercise per week.  The importance of healthy food choices with portion control discussed. Encouraged to start a food diary, count calories and to consider  joining a support group. Sample diet sheets offered. Goals set by the patient for the next several months.   Weight /BMI 08/23/2015 01/16/2015 09/15/2014  WEIGHT 177 lb 180 lb 174 lb 1.3 oz  HEIGHT  5\' 3"  5\' 3"  5\' 3"   BMI 31.36 kg/m2 31.89 kg/m2 30.84 kg/m2    Current exercise per week 30 minutes.   Prediabetes Patient educated about the importance of limiting  Carbohydrate intake , the need to commit to daily physical activity for a minimum of 30 minutes , and to commit weight loss. The fact that changes in all these areas will reduce or eliminate all together the development of diabetes is stressed.  Improved  Diabetic Labs Latest Ref  Rng 07/01/2015 05/02/2014 09/16/2013 03/22/2013 12/25/2012  HbA1c <5.7 % 5.7(H) 5.8(H) 5.9(H) 5.9(H) -  Microalbumin 0.00-1.89 mg/dL - - - - -  Micro/Creat Ratio 0.0-30.0 mg/g - - - - -  Chol 125 - 200 mg/dL 621175 308194 657198 846(N215(H) -  HDL >=46 mg/dL 52 45 45 45 -  Calc LDL <130 mg/dL 629109 528(U134(H) 132(G135(H) 401(U147(H) -  Triglycerides <150 mg/dL 68 76 88 272115 -  Creatinine 0.60 - 0.88 mg/dL 5.360.74 6.440.73 0.340.71 - 7.420.67   BP/Weight 08/23/2015 01/16/2015 09/15/2014 05/16/2014 01/10/2014 09/16/2013 03/22/2013  Systolic BP 152 158 164 164 148 138 142  Diastolic BP 70 82 78 80 86 84 80  Wt. (Lbs) 177 180 174.08 173.12 187.04 188 186  BMI 31.36 31.89 30.84 30.91 33.39 33.56 31.91   Foot/eye exam completion dates 09/19/2010  Foot exam Order yes  Foot Form Completion -       Onychomycosis Excessively long and thick toenails, refer to podiatry for nail care  Dementia Pt will benefit from medication,  But will not comply , will hold off on attempting to prescribe AT this time, due to multiple prior unsuccessful attempts and she does not demonstrate risky behavior

## 2015-09-04 NOTE — Assessment & Plan Note (Signed)
Uncontrolled , add toprol DASH diet and commitment to daily physical activity for a minimum of 30 minutes discussed and encouraged, as a part of hypertension management. The importance of attaining a healthy weight is also discussed.  BP/Weight 08/23/2015 01/16/2015 09/15/2014 05/16/2014 01/10/2014 09/16/2013 03/22/2013  Systolic BP 152 158 164 164 148 138 142  Diastolic BP 70 82 78 80 86 84 80  Wt. (Lbs) 177 180 174.08 173.12 187.04 188 186  BMI 31.36 31.89 30.84 30.91 33.39 33.56 31.91

## 2015-09-04 NOTE — Assessment & Plan Note (Signed)
After obtaining informed consent, the vaccine is  administered by LPN.  

## 2015-09-04 NOTE — Assessment & Plan Note (Signed)
Hyperlipidemia:Low fat diet discussed and encouraged.   Lipid Panel  Lab Results  Component Value Date   CHOL 175 07/01/2015   HDL 52 07/01/2015   LDLCALC 109 07/01/2015   TRIG 68 07/01/2015   CHOLHDL 3.4 07/01/2015  Improved

## 2015-09-04 NOTE — Assessment & Plan Note (Signed)
Excessively long and thick toenails, refer to podiatry for nail care

## 2015-09-04 NOTE — Assessment & Plan Note (Signed)
Patient educated about the importance of limiting  Carbohydrate intake , the need to commit to daily physical activity for a minimum of 30 minutes , and to commit weight loss. The fact that changes in all these areas will reduce or eliminate all together the development of diabetes is stressed.  Improved  Diabetic Labs Latest Ref Rng 07/01/2015 05/02/2014 09/16/2013 03/22/2013 12/25/2012  HbA1c <5.7 % 5.7(H) 5.8(H) 5.9(H) 5.9(H) -  Microalbumin 0.00-1.89 mg/dL - - - - -  Micro/Creat Ratio 0.0-30.0 mg/g - - - - -  Chol 125 - 200 mg/dL 409175 811194 914198 782(N215(H) -  HDL >=46 mg/dL 52 45 45 45 -  Calc LDL <130 mg/dL 562109 130(Q134(H) 657(Q135(H) 469(G147(H) -  Triglycerides <150 mg/dL 68 76 88 295115 -  Creatinine 0.60 - 0.88 mg/dL 2.840.74 1.320.73 4.400.71 - 1.020.67   BP/Weight 08/23/2015 01/16/2015 09/15/2014 05/16/2014 01/10/2014 09/16/2013 03/22/2013  Systolic BP 152 158 164 164 148 138 142  Diastolic BP 70 82 78 80 86 84 80  Wt. (Lbs) 177 180 174.08 173.12 187.04 188 186  BMI 31.36 31.89 30.84 30.91 33.39 33.56 31.91   Foot/eye exam completion dates 09/19/2010  Foot exam Order yes  Foot Form Completion -

## 2015-09-04 NOTE — Assessment & Plan Note (Signed)
Improved Patient re-educated about  the importance of commitment to a  minimum of 150 minutes of exercise per week.  The importance of healthy food choices with portion control discussed. Encouraged to start a food diary, count calories and to consider  joining a support group. Sample diet sheets offered. Goals set by the patient for the next several months.   Weight /BMI 08/23/2015 01/16/2015 09/15/2014  WEIGHT 177 lb 180 lb 174 lb 1.3 oz  HEIGHT 5\' 3"  5\' 3"  5\' 3"   BMI 31.36 kg/m2 31.89 kg/m2 30.84 kg/m2    Current exercise per week 30 minutes.

## 2015-09-04 NOTE — Assessment & Plan Note (Signed)
Pt will benefit from medication,  But will not comply , will hold off on attempting to prescribe AT this time, due to multiple prior unsuccessful attempts and she does not demonstrate risky behavior

## 2015-09-12 ENCOUNTER — Telehealth: Payer: Self-pay | Admitting: Family Medicine

## 2015-09-12 MED ORDER — METOPROLOL SUCCINATE ER 25 MG PO TB24: 25.0000 mg | ORAL_TABLET | Freq: Every day | ORAL | Status: DC

## 2015-09-12 MED ORDER — ERGOCALCIFEROL 1.25 MG (50000 UT) PO CAPS: 50000.0000 [IU] | ORAL_CAPSULE | ORAL | Status: DC

## 2015-09-12 NOTE — Telephone Encounter (Signed)
Patient is stating that the Vitamin D never made it to [facility] and also needs refill on metoprolol succinate (TOPROL XL) 25 MG 24 hr tablet  Please advise?

## 2015-09-12 NOTE — Telephone Encounter (Signed)
Med refilled.

## 2015-11-27 ENCOUNTER — Other Ambulatory Visit: Payer: Self-pay | Admitting: Family Medicine

## 2015-12-26 ENCOUNTER — Encounter: Payer: Medicare Other | Admitting: Family Medicine

## 2016-01-22 ENCOUNTER — Ambulatory Visit (INDEPENDENT_AMBULATORY_CARE_PROVIDER_SITE_OTHER): Payer: Medicare Other | Admitting: Family Medicine

## 2016-01-22 ENCOUNTER — Encounter: Payer: Self-pay | Admitting: Family Medicine

## 2016-01-22 VITALS — BP 164/78 | HR 99 | Resp 16 | Ht 63.0 in | Wt 181.0 lb

## 2016-01-22 DIAGNOSIS — E785 Hyperlipidemia, unspecified: Secondary | ICD-10-CM | POA: Diagnosis not present

## 2016-01-22 DIAGNOSIS — R7303 Prediabetes: Secondary | ICD-10-CM

## 2016-01-22 DIAGNOSIS — Z0000 Encounter for general adult medical examination without abnormal findings: Secondary | ICD-10-CM | POA: Diagnosis not present

## 2016-01-22 DIAGNOSIS — E559 Vitamin D deficiency, unspecified: Secondary | ICD-10-CM | POA: Diagnosis not present

## 2016-01-22 DIAGNOSIS — I10 Essential (primary) hypertension: Secondary | ICD-10-CM

## 2016-01-22 MED ORDER — METOPROLOL SUCCINATE ER 25 MG PO TB24: 25.0000 mg | ORAL_TABLET | Freq: Every day | ORAL | Status: DC

## 2016-01-22 MED ORDER — VITAMIN D (ERGOCALCIFEROL) 1.25 MG (50000 UNIT) PO CAPS: ORAL_CAPSULE | ORAL | Status: DC

## 2016-01-22 NOTE — Patient Instructions (Signed)
F/u early September , call if you need me before  No medication changes  Fasting labs end August  I recommend going to adult day care / senior citizens group meetings on a regularlbasis  Please start word search puzzles and keep involved with what's going on around you  Fall Prevention in the Home  Falls can cause injuries. They can happen to people of all ages. There are many things you can do to make your home safe and to help prevent falls.  WHAT CAN I DO ON THE OUTSIDE OF MY HOME?  Regularly fix the edges of walkways and driveways and fix any cracks.  Remove anything that might make you trip as you walk through a door, such as a raised step or threshold.  Trim any bushes or trees on the path to your home.  Use bright outdoor lighting.  Clear any walking paths of anything that might make someone trip, such as rocks or tools.  Regularly check to see if handrails are loose or broken. Make sure that both sides of any steps have handrails.  Any raised decks and porches should have guardrails on the edges.  Have any leaves, snow, or ice cleared regularly.  Use sand or salt on walking paths during winter.  Clean up any spills in your garage right away. This includes oil or grease spills. WHAT CAN I DO IN THE BATHROOM?   Use night lights.  Install grab bars by the toilet and in the tub and shower. Do not use towel bars as grab bars.  Use non-skid mats or decals in the tub or shower.  If you need to sit down in the shower, use a plastic, non-slip stool.  Keep the floor dry. Clean up any water that spills on the floor as soon as it happens.  Remove soap buildup in the tub or shower regularly.  Attach bath mats securely with double-sided non-slip rug tape.  Do not have throw rugs and other things on the floor that can make you trip. WHAT CAN I DO IN THE BEDROOM?  Use night lights.  Make sure that you have a light by your bed that is easy to reach.  Do not use any  sheets or blankets that are too big for your bed. They should not hang down onto the floor.  Have a firm chair that has side arms. You can use this for support while you get dressed.  Do not have throw rugs and other things on the floor that can make you trip. WHAT CAN I DO IN THE KITCHEN?  Clean up any spills right away.  Avoid walking on wet floors.  Keep items that you use a lot in easy-to-reach places.  If you need to reach something above you, use a strong step stool that has a grab bar.  Keep electrical cords out of the way.  Do not use floor polish or wax that makes floors slippery. If you must use wax, use non-skid floor wax.  Do not have throw rugs and other things on the floor that can make you trip. WHAT CAN I DO WITH MY STAIRS?  Do not leave any items on the stairs.  Make sure that there are handrails on both sides of the stairs and use them. Fix handrails that are broken or loose. Make sure that handrails are as long as the stairways.  Check any carpeting to make sure that it is firmly attached to the stairs. Fix any carpet that is  loose or worn.  Avoid having throw rugs at the top or bottom of the stairs. If you do have throw rugs, attach them to the floor with carpet tape.  Make sure that you have a light switch at the top of the stairs and the bottom of the stairs. If you do not have them, ask someone to add them for you. WHAT ELSE CAN I DO TO HELP PREVENT FALLS?  Wear shoes that:  Do not have high heels.  Have rubber bottoms.  Are comfortable and fit you well.  Are closed at the toe. Do not wear sandals.  If you use a stepladder:  Make sure that it is fully opened. Do not climb a closed stepladder.  Make sure that both sides of the stepladder are locked into place.  Ask someone to hold it for you, if possible.  Clearly mark and make sure that you can see:  Any grab bars or handrails.  First and last steps.  Where the edge of each step  is.  Use tools that help you move around (mobility aids) if they are needed. These include:  Canes.  Walkers.  Scooters.  Crutches.  Turn on the lights when you go into a dark area. Replace any light bulbs as soon as they burn out.  Set up your furniture so you have a clear path. Avoid moving your furniture around.  If any of your floors are uneven, fix them.  If there are any pets around you, be aware of where they are.  Review your medicines with your doctor. Some medicines can make you feel dizzy. This can increase your chance of falling. Ask your doctor what other things that you can do to help prevent falls.   This information is not intended to replace advice given to you by your health care provider. Make sure you discuss any questions you have with your health care provider.   Document Released: 08/17/2009 Document Revised: 03/07/2015 Document Reviewed: 11/25/2014 Elsevier Interactive Patient Education [employer].

## 2016-01-22 NOTE — Progress Notes (Signed)
Subjective:    Patient ID: [name], female    DOB: 1926/10/13, 82 y.o.   MRN: [id number]  HPI Preventive Screening-Counseling & Management   Patient present here today for a Medicare annual wellness visit.   Current Problems (verified)   Medications Prior to Visit Allergies (verified)   PAST HISTORY  Family History (verified)   Social History  Divorced, no children, retired caregiver, current smoker    Risk Factors  Current exercise habits:  Walks as able   Dietary issues discussed: heart healthy diet encouraged, limit carbs and fried foods   Cardiac risk factors:   Depression Screen  (Note: if answer to either of the following is "Yes", a more complete depression screening is indicated)   Over the past two weeks, have you felt down, depressed or hopeless? No  Over the past two weeks, have you felt little interest or pleasure in doing things? No  Have you lost interest or pleasure in daily life? No  Do you often feel hopeless? No  Do you cry easily over simple problems? No   Activities of Daily Living  In your present state of health, do you have any difficulty performing the following activities?  Driving?: doesn't drive  Managing money?: No Feeding yourself?:No Getting from bed to chair?:No Climbing a flight of stairs?:No Preparing food and eating?:No Bathing or showering?:No Getting dressed?:No Getting to the toilet?:No Using the toilet?:No Moving around from place to place?: No  Fall Risk Assessment In the past year have you fallen or had a near fall?:No Are you currently taking any medications that make you dizzy?:No   Hearing Difficulties: No Do you often ask people to speak up or repeat themselves?:No Do you experience ringing or noises in your ears?:No Do you have difficulty understanding soft or whispered voices?:No  Cognitive Testing  Alert? Yes Normal Appearance?Yes  Oriented to person? Yes Place? Yes  Time? no Displays appropriate  judgment?Yes  Can read the correct time from a watch face? yes Are you having problems remembering things? yes  Advanced Directives have been discussed with the patient?Yes, brochure given , full code    List the Names of Other Physician/Practitioners you currently use:  None   Indicate any recent Medical Services you may have received from other than [facility] providers in the past year (date may be approximate).   Assessment:    Annual Wellness Exam   Plan:      Medicare Attestation  I have personally reviewed:  The patient's medical and social history  Their use of alcohol, tobacco or illicit drugs  Their current medications and supplements  The patient's functional ability including ADLs,fall risks, home safety risks, cognitive, and hearing and visual impairment  Diet and physical activities  Evidence for depression or mood disorders  The patient's weight, height, BMI, and visual acuity have been recorded in the chart. I have made referrals, counseling, and provided education to the patient based on review of the above and I have provided the patient with a written personalized care plan for preventive services.  \   Review of Systems     Objective:   Physical Exam  BP 164/78 mmHg  Pulse 99  Resp 16  Ht 5\' 3"  (1.6 m)  Wt 181 lb (82.101 kg)  BMI 32.07 kg/m2  SpO2 95%       Assessment & Plan:  Medicare annual wellness visit, subsequent Annual exam as documented. Counseling done  re healthy lifestyle involving commitment to 150 minutes  exercise per week, heart healthy diet, and attaining healthy weight.The importance of adequate sleep also discussed. Regular seat belt use and home safety, is also discussed. Changes in health habits are decided on by the patient with goals and time frames  set for achieving them. Immunization and cancer screening needs are specifically addressed at this visit.

## 2016-01-23 ENCOUNTER — Encounter: Payer: Self-pay | Admitting: Family Medicine

## 2016-01-23 NOTE — Assessment & Plan Note (Signed)
Uncontrolled, pt will not take any additional medication, this has been attempted in the past. No change in medication Reduce salt intake , increase  Vegetable and fruit in diet

## 2016-01-23 NOTE — Assessment & Plan Note (Signed)

## 2016-03-25 ENCOUNTER — Ambulatory Visit (INDEPENDENT_AMBULATORY_CARE_PROVIDER_SITE_OTHER): Payer: Medicare Other | Admitting: Family Medicine

## 2016-03-25 ENCOUNTER — Encounter: Payer: Self-pay | Admitting: Family Medicine

## 2016-03-25 VITALS — BP 146/74 | HR 92 | Resp 18 | Ht 63.0 in | Wt 175.0 lb

## 2016-03-25 DIAGNOSIS — I10 Essential (primary) hypertension: Secondary | ICD-10-CM

## 2016-03-25 DIAGNOSIS — R7302 Impaired glucose tolerance (oral): Secondary | ICD-10-CM

## 2016-03-25 DIAGNOSIS — R296 Repeated falls: Secondary | ICD-10-CM

## 2016-03-25 DIAGNOSIS — E559 Vitamin D deficiency, unspecified: Secondary | ICD-10-CM | POA: Diagnosis not present

## 2016-03-25 DIAGNOSIS — R7303 Prediabetes: Secondary | ICD-10-CM | POA: Diagnosis not present

## 2016-03-25 DIAGNOSIS — Z9181 History of falling: Secondary | ICD-10-CM

## 2016-03-25 DIAGNOSIS — F0390 Unspecified dementia without behavioral disturbance: Secondary | ICD-10-CM

## 2016-03-25 MED ORDER — RIVASTIGMINE 4.6 MG/24HR TD PT24: 4.6000 mg | MEDICATED_PATCH | Freq: Every day | TRANSDERMAL | Status: DC

## 2016-03-25 NOTE — Progress Notes (Signed)
   Subjective:    Patient ID: [name], female    DOB: 08/30/1926, 82 y.o.   MRN: [id number]  HPI  Pt brought in by her niece due to her concern about significant deterioration in [name]'s ability to function and safely remain on her own at home, due to worsening demantia. She denies aggressive behavior, and she is now also interested in finding alternative living situation for her, and starting medication if this may help. Over the years , it has been "impossible" to get pt to take medications that she has needed, so I am skeptical that this will work  Review of Systems See HPI History from niece, pt is incapable Denies recent fever or chills. Denies sinus pressure, nasal congestion, ear pain or sore throat. Denies chest congestion, productive cough or wheezing. Denies  leg swelling Denies abdominal pain, nausea, vomiting,diarrhea or constipation.   Denies malodorous urine. Does have  limitation in mobility. Denies headaches, seizures, numbness, or tingling. Denies depression, anxiety or insomnia. Denies skin break down or rash.        Objective:   Physical Exam  BP 146/74 mmHg  Pulse 92  Resp 18  Ht 5\' 3"  (1.6 m)  Wt 175 lb (79.379 kg)  BMI 31.01 kg/m2  SpO2 95% Patient alert  and in no cardiopulmonary distress.  HEENT: No facial asymmetry, EOMI,   oropharynx pink and moist.  Neck adequate ROM, though reduced no JVD, no mass.  Chest: Clear to auscultation bilaterally.Decreased air entry  CVS: S1, S2 no murmurs, no S3.Regular rate.  ABD: Soft non tender.   Ext: No edema  MS: decreased ROM spine, shoulders, hips and knees.  Skin: Intact, no ulcerations or rash noted.  Psych: Good eye contact, normal affect. Memory loss  not anxious or depressed appearing.  CNS: CN 2-12 intact, power,  normal throughout.no focal deficits noted.       Assessment & Plan:  Dementia Significantly worse over past 5 years, start daily aricept , review in 8 weeks  Essential  hypertension, benign Sub optimal control, no change in management DASH diet  encouraged, as a part of hypertension management.  BP/Weight 03/25/2016 01/22/2016 08/23/2015 01/16/2015 09/15/2014 05/16/2014 01/10/2014  Systolic BP 146 164 152 158 164 164 148  Diastolic BP 74 78 70 82 78 80 86  Wt. (Lbs) 175 181 177 180 174.08 173.12 187.04  BMI 31.01 32.07 31.36 31.89 30.84 30.91 33.39        Patient had 2 or more falls in past year High fall risk, home safety reviewed with pt and her caregiver

## 2016-03-25 NOTE — Patient Instructions (Addendum)
F/u in 4 month, call if you need me sooner  Labs todaty, hBA1C, chem 7 and vit D  New is patch to be changed daily for memory  You are referred for in home therapy  Care not to fall, and pls get an alarm necklace in case you fall when no one at  Home Fall Prevention in the Home  Falls can cause injuries. They can happen to people of all ages. There are many things you can do to make your home safe and to help prevent falls.  WHAT CAN I DO ON THE OUTSIDE OF MY HOME?  Regularly fix the edges of walkways and driveways and fix any cracks.  Remove anything that might make you trip as you walk through a door, such as a raised step or threshold.  Trim any bushes or trees on the path to your home.  Use bright outdoor lighting.  Clear any walking paths of anything that might make someone trip, such as rocks or tools.  Regularly check to see if handrails are loose or broken. Make sure that both sides of any steps have handrails.  Any raised decks and porches should have guardrails on the edges.  Have any leaves, snow, or ice cleared regularly.  Use sand or salt on walking paths during winter.  Clean up any spills in your garage right away. This includes oil or grease spills. WHAT CAN I DO IN THE BATHROOM?   Use night lights.  Install grab bars by the toilet and in the tub and shower. Do not use towel bars as grab bars.  Use non-skid mats or decals in the tub or shower.  If you need to sit down in the shower, use a plastic, non-slip stool.  Keep the floor dry. Clean up any water that spills on the floor as soon as it happens.  Remove soap buildup in the tub or shower regularly.  Attach bath mats securely with double-sided non-slip rug tape.  Do not have throw rugs and other things on the floor that can make you trip. WHAT CAN I DO IN THE BEDROOM?  Use night lights.  Make sure that you have a light by your bed that is easy to reach.  Do not use any sheets or blankets that  are too big for your bed. They should not hang down onto the floor.  Have a firm chair that has side arms. You can use this for support while you get dressed.  Do not have throw rugs and other things on the floor that can make you trip. WHAT CAN I DO IN THE KITCHEN?  Clean up any spills right away.  Avoid walking on wet floors.  Keep items that you use a lot in easy-to-reach places.  If you need to reach something above you, use a strong step stool that has a grab bar.  Keep electrical cords out of the way.  Do not use floor polish or wax that makes floors slippery. If you must use wax, use non-skid floor wax.  Do not have throw rugs and other things on the floor that can make you trip. WHAT CAN I DO WITH MY STAIRS?  Do not leave any items on the stairs.  Make sure that there are handrails on both sides of the stairs and use them. Fix handrails that are broken or loose. Make sure that handrails are as long as the stairways.  Check any carpeting to make sure that it is firmly attached to  the stairs. Fix any carpet that is loose or worn.  Avoid having throw rugs at the top or bottom of the stairs. If you do have throw rugs, attach them to the floor with carpet tape.  Make sure that you have a light switch at the top of the stairs and the bottom of the stairs. If you do not have them, ask someone to add them for you. WHAT ELSE CAN I DO TO HELP PREVENT FALLS?  Wear shoes that:  Do not have high heels.  Have rubber bottoms.  Are comfortable and fit you well.  Are closed at the toe. Do not wear sandals.  If you use a stepladder:  Make sure that it is fully opened. Do not climb a closed stepladder.  Make sure that both sides of the stepladder are locked into place.  Ask someone to hold it for you, if possible.  Clearly mark and make sure that you can see:  Any grab bars or handrails.  First and last steps.  Where the edge of each step is.  Use tools that help you  move around (mobility aids) if they are needed. These include:  Canes.  Walkers.  Scooters.  Crutches.  Turn on the lights when you go into a dark area. Replace any light bulbs as soon as they burn out.  Set up your furniture so you have a clear path. Avoid moving your furniture around.  If any of your floors are uneven, fix them.  If there are any pets around you, be aware of where they are.  Review your medicines with your doctor. Some medicines can make you feel dizzy. This can increase your chance of falling. Ask your doctor what other things that you can do to help prevent falls.   This information is not intended to replace advice given to you by your health care provider. Make sure you discuss any questions you have with your health care provider.   Document Released: 08/17/2009 Document Revised: 03/07/2015 Document Reviewed: 11/25/2014 Elsevier Interactive Patient Education [employer]

## 2016-03-28 DIAGNOSIS — F0390 Unspecified dementia without behavioral disturbance: Secondary | ICD-10-CM | POA: Diagnosis not present

## 2016-03-28 DIAGNOSIS — Z9181 History of falling: Secondary | ICD-10-CM | POA: Diagnosis not present

## 2016-03-28 DIAGNOSIS — R2689 Other abnormalities of gait and mobility: Secondary | ICD-10-CM | POA: Diagnosis not present

## 2016-03-28 DIAGNOSIS — R7303 Prediabetes: Secondary | ICD-10-CM | POA: Diagnosis not present

## 2016-03-28 DIAGNOSIS — I10 Essential (primary) hypertension: Secondary | ICD-10-CM | POA: Diagnosis not present

## 2016-03-28 MED ORDER — DONEPEZIL HCL 5 MG PO TABS: 5.0000 mg | ORAL_TABLET | Freq: Every day | ORAL | Status: DC

## 2016-03-31 NOTE — Assessment & Plan Note (Signed)
Significantly worse over past 5 years, start daily aricept , review in 8 weeks

## 2016-03-31 NOTE — Assessment & Plan Note (Signed)
Sub optimal control, no change in management DASH diet  encouraged, as a part of hypertension management.  BP/Weight 03/25/2016 01/22/2016 08/23/2015 01/16/2015 09/15/2014 05/16/2014 01/10/2014  Systolic BP 146 164 152 158 164 164 148  Diastolic BP 74 78 70 82 78 80 86  Wt. (Lbs) 175 181 177 180 174.08 173.12 187.04  BMI 31.01 32.07 31.36 31.89 30.84 30.91 33.39

## 2016-03-31 NOTE — Assessment & Plan Note (Signed)
High fall risk, home safety reviewed with pt and her caregiver

## 2016-04-01 DIAGNOSIS — Z9181 History of falling: Secondary | ICD-10-CM | POA: Insufficient documentation

## 2016-04-01 NOTE — Assessment & Plan Note (Signed)
Progressive gait instability and joint stiffness noted by caregiver, refer for in home physical therapy twice weekly for 6 weeks

## 2016-04-01 NOTE — Progress Notes (Signed)
   Subjective:    Patient ID: Hannah LoanMary E Angerer, female    DOB: 11-26-1925, 82 y.o.   MRN: [id number]  HPI Caregiver requests in home Pt due to increased stiffness and gait disturbance placing her at high fall risk. Near falls have been witnessed   Review of Systems See HPI     Objective:   Physical Exam  MS: decreased ROM spine, hips , shoulders and knees with joint deformity and unsteay gait      Assessment & Plan:

## 2016-04-03 DIAGNOSIS — F0390 Unspecified dementia without behavioral disturbance: Secondary | ICD-10-CM | POA: Diagnosis not present

## 2016-04-03 DIAGNOSIS — I10 Essential (primary) hypertension: Secondary | ICD-10-CM | POA: Diagnosis not present

## 2016-04-03 DIAGNOSIS — R2689 Other abnormalities of gait and mobility: Secondary | ICD-10-CM | POA: Diagnosis not present

## 2016-04-03 DIAGNOSIS — Z9181 History of falling: Secondary | ICD-10-CM | POA: Diagnosis not present

## 2016-04-03 DIAGNOSIS — R7303 Prediabetes: Secondary | ICD-10-CM | POA: Diagnosis not present

## 2016-04-05 DIAGNOSIS — R7303 Prediabetes: Secondary | ICD-10-CM | POA: Diagnosis not present

## 2016-04-05 DIAGNOSIS — F0390 Unspecified dementia without behavioral disturbance: Secondary | ICD-10-CM | POA: Diagnosis not present

## 2016-04-05 DIAGNOSIS — I10 Essential (primary) hypertension: Secondary | ICD-10-CM | POA: Diagnosis not present

## 2016-04-05 DIAGNOSIS — Z9181 History of falling: Secondary | ICD-10-CM | POA: Diagnosis not present

## 2016-04-05 DIAGNOSIS — R2689 Other abnormalities of gait and mobility: Secondary | ICD-10-CM | POA: Diagnosis not present

## 2016-04-08 DIAGNOSIS — R7303 Prediabetes: Secondary | ICD-10-CM | POA: Diagnosis not present

## 2016-04-08 DIAGNOSIS — R2689 Other abnormalities of gait and mobility: Secondary | ICD-10-CM | POA: Diagnosis not present

## 2016-04-08 DIAGNOSIS — Z9181 History of falling: Secondary | ICD-10-CM | POA: Diagnosis not present

## 2016-04-08 DIAGNOSIS — I10 Essential (primary) hypertension: Secondary | ICD-10-CM | POA: Diagnosis not present

## 2016-04-08 DIAGNOSIS — F0390 Unspecified dementia without behavioral disturbance: Secondary | ICD-10-CM | POA: Diagnosis not present

## 2016-04-11 DIAGNOSIS — I10 Essential (primary) hypertension: Secondary | ICD-10-CM | POA: Diagnosis not present

## 2016-04-11 DIAGNOSIS — R7303 Prediabetes: Secondary | ICD-10-CM | POA: Diagnosis not present

## 2016-04-11 DIAGNOSIS — R2689 Other abnormalities of gait and mobility: Secondary | ICD-10-CM | POA: Diagnosis not present

## 2016-04-11 DIAGNOSIS — Z9181 History of falling: Secondary | ICD-10-CM | POA: Diagnosis not present

## 2016-04-11 DIAGNOSIS — F0390 Unspecified dementia without behavioral disturbance: Secondary | ICD-10-CM | POA: Diagnosis not present

## 2016-04-15 DIAGNOSIS — Z9181 History of falling: Secondary | ICD-10-CM | POA: Diagnosis not present

## 2016-04-15 DIAGNOSIS — R7303 Prediabetes: Secondary | ICD-10-CM

## 2016-04-15 DIAGNOSIS — R2689 Other abnormalities of gait and mobility: Secondary | ICD-10-CM

## 2016-04-15 DIAGNOSIS — F0390 Unspecified dementia without behavioral disturbance: Secondary | ICD-10-CM

## 2016-04-15 DIAGNOSIS — I10 Essential (primary) hypertension: Secondary | ICD-10-CM

## 2016-04-17 DIAGNOSIS — R2689 Other abnormalities of gait and mobility: Secondary | ICD-10-CM | POA: Diagnosis not present

## 2016-04-17 DIAGNOSIS — R7303 Prediabetes: Secondary | ICD-10-CM | POA: Diagnosis not present

## 2016-04-17 DIAGNOSIS — F0390 Unspecified dementia without behavioral disturbance: Secondary | ICD-10-CM | POA: Diagnosis not present

## 2016-04-17 DIAGNOSIS — Z9181 History of falling: Secondary | ICD-10-CM | POA: Diagnosis not present

## 2016-04-17 DIAGNOSIS — I10 Essential (primary) hypertension: Secondary | ICD-10-CM | POA: Diagnosis not present

## 2016-04-22 DIAGNOSIS — I10 Essential (primary) hypertension: Secondary | ICD-10-CM | POA: Diagnosis not present

## 2016-04-22 DIAGNOSIS — F0390 Unspecified dementia without behavioral disturbance: Secondary | ICD-10-CM | POA: Diagnosis not present

## 2016-04-22 DIAGNOSIS — Z9181 History of falling: Secondary | ICD-10-CM | POA: Diagnosis not present

## 2016-04-22 DIAGNOSIS — R7303 Prediabetes: Secondary | ICD-10-CM | POA: Diagnosis not present

## 2016-04-22 DIAGNOSIS — R2689 Other abnormalities of gait and mobility: Secondary | ICD-10-CM | POA: Diagnosis not present

## 2016-04-24 DIAGNOSIS — Z9181 History of falling: Secondary | ICD-10-CM | POA: Diagnosis not present

## 2016-04-24 DIAGNOSIS — R2689 Other abnormalities of gait and mobility: Secondary | ICD-10-CM | POA: Diagnosis not present

## 2016-04-24 DIAGNOSIS — I10 Essential (primary) hypertension: Secondary | ICD-10-CM | POA: Diagnosis not present

## 2016-04-24 DIAGNOSIS — R7303 Prediabetes: Secondary | ICD-10-CM | POA: Diagnosis not present

## 2016-04-24 DIAGNOSIS — F0390 Unspecified dementia without behavioral disturbance: Secondary | ICD-10-CM | POA: Diagnosis not present

## 2016-05-31 ENCOUNTER — Other Ambulatory Visit: Payer: Self-pay | Admitting: Family Medicine

## 2016-07-01 ENCOUNTER — Encounter: Payer: Self-pay | Admitting: Family Medicine

## 2016-07-01 DIAGNOSIS — R7303 Prediabetes: Secondary | ICD-10-CM | POA: Diagnosis not present

## 2016-07-01 DIAGNOSIS — R7302 Impaired glucose tolerance (oral): Secondary | ICD-10-CM | POA: Diagnosis not present

## 2016-07-01 DIAGNOSIS — E559 Vitamin D deficiency, unspecified: Secondary | ICD-10-CM | POA: Diagnosis not present

## 2016-07-05 ENCOUNTER — Ambulatory Visit: Payer: Medicare Other | Admitting: Family Medicine

## 2016-07-09 ENCOUNTER — Ambulatory Visit: Payer: Medicare Other | Admitting: Family Medicine

## 2016-08-05 ENCOUNTER — Ambulatory Visit: Payer: Medicare Other | Admitting: Family Medicine

## 2016-08-21 ENCOUNTER — Ambulatory Visit: Payer: Medicare Other | Admitting: Family Medicine

## 2016-08-21 ENCOUNTER — Ambulatory Visit (INDEPENDENT_AMBULATORY_CARE_PROVIDER_SITE_OTHER): Payer: Medicare Other | Admitting: Family Medicine

## 2016-08-21 VITALS — BP 150/76 | HR 84 | Ht 63.0 in | Wt 180.0 lb

## 2016-08-21 DIAGNOSIS — F17210 Nicotine dependence, cigarettes, uncomplicated: Secondary | ICD-10-CM

## 2016-08-21 DIAGNOSIS — Z0000 Encounter for general adult medical examination without abnormal findings: Secondary | ICD-10-CM | POA: Diagnosis not present

## 2016-08-21 DIAGNOSIS — Z1211 Encounter for screening for malignant neoplasm of colon: Secondary | ICD-10-CM | POA: Diagnosis not present

## 2016-08-21 DIAGNOSIS — E559 Vitamin D deficiency, unspecified: Secondary | ICD-10-CM

## 2016-08-21 DIAGNOSIS — Z23 Encounter for immunization: Secondary | ICD-10-CM | POA: Diagnosis not present

## 2016-08-21 LAB — HEMOCCULT GUIAC POC 1CARD (OFFICE): Fecal Occult Blood, POC: NEGATIVE

## 2016-08-21 NOTE — Assessment & Plan Note (Signed)

## 2016-08-21 NOTE — Assessment & Plan Note (Signed)

## 2016-08-21 NOTE — Assessment & Plan Note (Signed)
After obtaining informed consent, the vaccine is  administered by LPN.  

## 2016-08-21 NOTE — Patient Instructions (Signed)
Annual wellness end March, call if you need me sooner  Flu vaccine today  Doing well, no changes in medication  We will try to obtain labs done earlier this year, nurse to discuss with you further  Thank you  for choosing [facility]. We consider it a privelige to serve you.  Delivering excellent health care in a caring and  compassionate way is our goal.  Partnering with you,  so that together we can achieve this goal is our strategy.   pls STOP SMOKING  Please try your very best NOT TO fALL

## 2016-08-23 ENCOUNTER — Encounter: Payer: Self-pay | Admitting: Family Medicine

## 2016-08-23 NOTE — Progress Notes (Signed)
    Hannah LoanMary E Friddle     MRN: [id number]      DOB: 1926/05/10  HPI: Patient is in for annual physical exam. No other health concerns are expressed or addressed at the visit. Recent labs, if available are reviewed. Immunization is reviewed , and  updated   PE: Pleasant  female, alert , in no cardio-pulmonary distress. Afebrile. HEENT No facial trauma or asymetry. Sinuses non tender.  Extra occullar muscles intact,External ears normal, tympanic membranes clear. Oropharynx moist, no exudate. Neck: supple, no adenopathy,JVD or thyromegaly.No bruits.  Chest: Clear to ascultation bilaterally.No crackles or wheezes. Non tender to palpation  Breast: No asymetry,no masses or lumps. No tenderness. No nipple discharge or inversion. No axillary or supraclavicular adenopathy  Cardiovascular system; Heart sounds normal,  S1 and  S2 ,no S3.  No murmur, or thrill. Apical beat not displaced Peripheral pulses normal.  Abdomen: Soft, non tender, no organomegaly or masses. No bruits. Bowel sounds normal. No guarding, tenderness or rebound.  Rectal:  Normal sphincter tone. No rectal mass. Guaiac negative stool.  GU: Not examined.   Musculoskeletal exam: Decreased  ROM of spine, hips , shoulders and knees. deformity ,swelling and  crepitus noted. No muscle wasting or atrophy.   Neurologic: Cranial nerves 2 to 12 intact. Power, tone ,sensation and reflexes normal throughout. No disturbance in gait. No tremor.  Skin: Intact, no ulceration, erythema , scaling or rash noted. Pigmentation normal throughout  Psych; Normal mood and affect.Impaired  Judgement and concentration   Assessment & Plan:  Annual physical exam Annual exam as documented. Counseling done  re healthy lifestyle involving commitment to 150 minutes exercise per week, heart healthy diet, and attaining healthy weight.The importance of adequate sleep also discussed. Regular seat belt use and home safety, is also  discussed. Changes in health habits are decided on by the patient with goals and time frames  set for achieving them. Immunization and cancer screening needs are specifically addressed at this visit.   Nicotine dependence Patient counseled for approximately 5 minutes regarding the health risks of ongoing nicotine use, specifically all types of cancer, heart disease, stroke and respiratory failure. The options available for help with cessation ,the behavioral changes to assist the process, and the option to either gradully reduce usage  Or abruptly stop.is also discussed. Pt is also encouraged to set specific goals in number of cigarettes used daily, as well as to set a quit date.     Need for prophylactic vaccination and inoculation against influenza After obtaining informed consent, the vaccine is  administered by LPN.

## 2016-09-12 ENCOUNTER — Other Ambulatory Visit: Payer: Self-pay | Admitting: Family Medicine

## 2016-09-23 ENCOUNTER — Encounter (HOSPITAL_COMMUNITY): Payer: Self-pay | Admitting: Emergency Medicine

## 2016-09-23 ENCOUNTER — Emergency Department (HOSPITAL_COMMUNITY)
Admission: EM | Admit: 2016-09-23 | Discharge: 2016-09-24 | Disposition: A | Discharge status: Home / Self Care | Payer: Medicare Other | Attending: Emergency Medicine | Admitting: Emergency Medicine

## 2016-09-23 ENCOUNTER — Emergency Department (HOSPITAL_COMMUNITY): Payer: Medicare Other

## 2016-09-23 DIAGNOSIS — K5790 Diverticulosis of intestine, part unspecified, without perforation or abscess without bleeding: Secondary | ICD-10-CM | POA: Diagnosis not present

## 2016-09-23 DIAGNOSIS — K5900 Constipation, unspecified: Secondary | ICD-10-CM | POA: Diagnosis not present

## 2016-09-23 DIAGNOSIS — Z79899 Other long term (current) drug therapy: Secondary | ICD-10-CM | POA: Insufficient documentation

## 2016-09-23 DIAGNOSIS — I2510 Atherosclerotic heart disease of native coronary artery without angina pectoris: Secondary | ICD-10-CM | POA: Diagnosis not present

## 2016-09-23 DIAGNOSIS — Z7982 Long term (current) use of aspirin: Secondary | ICD-10-CM | POA: Diagnosis not present

## 2016-09-23 DIAGNOSIS — I10 Essential (primary) hypertension: Secondary | ICD-10-CM | POA: Insufficient documentation

## 2016-09-23 DIAGNOSIS — F17210 Nicotine dependence, cigarettes, uncomplicated: Secondary | ICD-10-CM | POA: Diagnosis not present

## 2016-09-23 DIAGNOSIS — K6289 Other specified diseases of anus and rectum: Secondary | ICD-10-CM | POA: Diagnosis present

## 2016-09-23 LAB — COMPREHENSIVE METABOLIC PANEL
ALK PHOS: 63 U/L (ref 38–126)
ALT: 14 U/L (ref 14–54)
ANION GAP: 7 (ref 5–15)
AST: 23 U/L (ref 15–41)
Albumin: 3.7 g/dL (ref 3.5–5.0)
BUN: 20 mg/dL (ref 6–20)
CALCIUM: 9.3 mg/dL (ref 8.9–10.3)
CO2: 29 mmol/L (ref 22–32)
Chloride: 100 mmol/L — ABNORMAL LOW (ref 101–111)
Creatinine, Ser: 0.72 mg/dL (ref 0.44–1.00)
Glucose, Bld: 88 mg/dL (ref 65–99)
Potassium: 4 mmol/L (ref 3.5–5.1)
SODIUM: 136 mmol/L (ref 135–145)
TOTAL PROTEIN: 7.6 g/dL (ref 6.5–8.1)
Total Bilirubin: 0.8 mg/dL (ref 0.3–1.2)

## 2016-09-23 LAB — CBC WITH DIFFERENTIAL/PLATELET
BASOS ABS: 0 10*3/uL (ref 0.0–0.1)
BASOS PCT: 0 %
EOS ABS: 0.1 10*3/uL (ref 0.0–0.7)
Eosinophils Relative: 1 %
HCT: 39.1 % (ref 36.0–46.0)
HEMOGLOBIN: 12.7 g/dL (ref 12.0–15.0)
Lymphocytes Relative: 14 %
Lymphs Abs: 1.7 10*3/uL (ref 0.7–4.0)
MCH: 26.1 pg (ref 26.0–34.0)
MCHC: 32.5 g/dL (ref 30.0–36.0)
MCV: 80.3 fL (ref 78.0–100.0)
Monocytes Absolute: 1.4 10*3/uL — ABNORMAL HIGH (ref 0.1–1.0)
Monocytes Relative: 11 %
NEUTROS PCT: 74 %
Neutro Abs: 9.2 10*3/uL — ABNORMAL HIGH (ref 1.7–7.7)
Platelets: 255 10*3/uL (ref 150–400)
RBC: 4.87 MIL/uL (ref 3.87–5.11)
RDW: 16.8 % — ABNORMAL HIGH (ref 11.5–15.5)
WBC: 12.4 10*3/uL — AB (ref 4.0–10.5)

## 2016-09-23 LAB — LIPASE, BLOOD: LIPASE: 21 U/L (ref 11–51)

## 2016-09-23 MED ORDER — ONDANSETRON HCL 4 MG/2ML IJ SOLN: 4.0000 mg | Freq: Once | INTRAMUSCULAR | Status: DC

## 2016-09-23 MED ORDER — SODIUM CHLORIDE 0.9 % IV BOLUS (SEPSIS)
250.0000 mL | Freq: Once | INTRAVENOUS | Status: AC
  Administered 2016-09-23: 250 mL via INTRAVENOUS

## 2016-09-23 MED ORDER — SODIUM CHLORIDE 0.9 % IV SOLN
INTRAVENOUS | Status: DC
  Administered 2016-09-23: 22:00:00 via INTRAVENOUS

## 2016-09-23 MED ORDER — IOPAMIDOL (ISOVUE-300) INJECTION 61%
INTRAVENOUS | Status: AC
  Filled 2016-09-23: qty 30

## 2016-09-23 MED ORDER — IOPAMIDOL (ISOVUE-300) INJECTION 61%
100.0000 mL | Freq: Once | INTRAVENOUS | Status: AC | PRN
  Administered 2016-09-23: 100 mL via INTRAVENOUS

## 2016-09-23 NOTE — ED Provider Notes (Signed)
[facility]-EMERGENCY DEPT Provider Note   CSN: [id number] Arrival date & time: 09/23/16  1527  By signing my name below, I, [name], attest that this documentation has been prepared under the direction and in the presence of Vanetta Mulders, MD . Electronically Signed: Modena Olson, Scribe. 09/23/2016. 9:19 PM.  History   Chief Complaint Chief Complaint  Patient presents with  . Constipation   The history is provided by the patient and a relative. No language interpreter was used.  Illness  This is a new problem. The current episode started 3 to 5 hours ago. The problem occurs constantly. The problem has not changed since onset.Pertinent negatives include no chest pain, no headaches and no shortness of breath. Nothing aggravates the symptoms. Nothing relieves the symptoms. She has tried nothing for the symptoms.   HPI Comments: [name] is a 82 y.o. female who presents to the Emergency Department complaining of constant moderate rectal pain that started today. Pt's family member states that pt was complaining of pain earlier and is unsure of pt's last BM. Pt reports no modifying factors for the pain. She denies any nausea, vomiting, or abdominal pain.     PCP: Syliva Overman, MD  Past Medical History:  Diagnosis Date  . Colon polyps   . Coronary atherosclerosis of native coronary artery October 2005.   Cardiac cath revealed 70% circumflex stenosis. Treated medically. Ejection fraction 55-65%.  . Dementia   . Depression    no symptoms expressed in 2014  . Diastolic dysfunction 02/2012  . Essential hypertension, benign   . Hyperlipidemia    diet controlled, med compliance a prob  . Myocardial infarction October 2005  . Obesity     Patient Active Problem List   Diagnosis Date Noted  . At high risk for falls 04/01/2016  . Patient had 2 or more falls in past year 03/25/2016  . Onychomycosis 08/23/2015  . Need for prophylactic vaccination and inoculation against  influenza 09/15/2014  . Diastolic dysfunction 09/18/2013  . Annual physical exam 09/18/2013  . Nicotine dependence 03/22/2013  . Chest pain 01/30/2012  . Pulmonary vascular congestion 01/30/2012  . Coronary atherosclerosis of native coronary artery 01/30/2012  . CARPAL TUNNEL SYNDROME, BILATERAL 09/19/2010  . Dementia 08/10/2010  . Vitamin D deficiency 04/25/2010  . FATIGUE 04/25/2010  . Prediabetes 02/10/2008  . Hyperlipidemia LDL goal <100 02/10/2008  . OBESITY 02/10/2008  . Essential hypertension, benign 02/10/2008    Past Surgical History:  Procedure Laterality Date  . ABDOMINAL HYSTERECTOMY    . APPENDECTOMY     Age 89  . VESICOVAGINAL FISTULA CLOSURE W/ TAH      OB History    No data available       Home Medications    Prior to Admission medications   Medication Sig Start Date End Date Taking? Authorizing Provider  aspirin EC 81 MG tablet Take 1 tablet (81 mg total) by mouth daily. 09/24/13  Yes Kerri Perches, MD  metoprolol succinate (TOPROL-XL) 25 MG 24 hr tablet TAKE 1 TABLET BY MOUTH ONCE DAILY. 09/12/16  Yes Kerri Perches, MD  Vitamin D, Ergocalciferol, (DRISDOL) 50000 units CAPS capsule TAKE 1 CAPSULE BY MOUTH ONCE WEEKLY. 06/01/16  Yes Kerri Perches, MD  polyethylene glycol [facility]) packet Take 17 g by mouth daily. 09/24/16   Vanetta Mulders, MD    Family History Family History  Problem Relation Age of Onset  . Heart attack Mother   . Cancer Mother     Breast   .  Alcohol abuse Father   . Heart attack Sister   . Diabetes Sister     Social History Social History  Substance Use Topics  . Smoking status: Current Some Day Smoker    Packs/day: 0.25    Types: Cigarettes  . Smokeless tobacco: Never Used     Comment: 1 or 2 cigs a day  . Alcohol use No     Allergies   Patient has no known allergies.   Review of Systems Review of Systems  Constitutional: Negative for chills.  HENT: Negative for congestion, rhinorrhea and sore  throat.   Eyes: Negative for visual disturbance.  Respiratory: Negative for cough and shortness of breath.   Cardiovascular: Negative for chest pain and leg swelling.  Gastrointestinal: Positive for rectal pain. Negative for diarrhea and nausea.  Genitourinary: Negative for dysuria and hematuria.  Musculoskeletal: Negative for back pain.  Skin: Negative for rash.  Neurological: Negative for headaches.  Hematological: Does not bruise/bleed easily.  Psychiatric/Behavioral: Negative for confusion.     Physical Exam Updated Vital Signs BP 169/59 (BP Location: Left Arm)   Pulse 85   Temp 98.5 F (36.9 C) (Oral)   Resp 20   Ht 5' (1.524 m)   Wt 180 lb (81.6 kg)   SpO2 96%   BMI 35.15 kg/m   Physical Exam  Constitutional: She appears well-developed and well-nourished. No distress.  HENT:  Head: Normocephalic and atraumatic.  Eyes: Conjunctivae and EOM are normal. Pupils are equal, round, and reactive to light. No scleral icterus.  Neck: Neck supple.  Cardiovascular: Normal rate and regular rhythm.   Pulmonary/Chest: Effort normal. No respiratory distress. She has no wheezes. She has no rales.  Pulse oximetry on room air is 96%.  Abdominal: Soft. Bowel sounds are normal. There is no tenderness.  Genitourinary:  Genitourinary Comments: Rectal exam: No gross blood. No prolapsed internal or external hemorrhoids. No fissures. Moderate amount of stool in the vault, not truly impacted.   Musculoskeletal: Normal range of motion. She exhibits no edema.  Neurological: She is alert. No cranial nerve deficit.  Skin: Skin is warm and dry.  Psychiatric: She has a normal mood and affect.  Nursing note and vitals reviewed.    ED Treatments / Results  DIAGNOSTIC STUDIES: Oxygen Saturation is 96% on RA, normal by my interpretation.    COORDINATION OF CARE: 9:23 PM- Pt advised of plan for treatment and pt agrees.  Labs (all labs ordered are listed, but only abnormal results are  displayed) Labs Reviewed  COMPREHENSIVE METABOLIC PANEL - Abnormal; Notable for the following:       Result Value   Chloride 100 (*)    All other components within normal limits  CBC WITH DIFFERENTIAL/PLATELET - Abnormal; Notable for the following:    WBC 12.4 (*)    RDW 16.8 (*)    Neutro Abs 9.2 (*)    Monocytes Absolute 1.4 (*)    All other components within normal limits  LIPASE, BLOOD   Results for orders placed or performed during the hospital encounter of 09/23/16  Comprehensive metabolic panel  Result Value Ref Range   Sodium 136 135 - 145 mmol/L   Potassium 4.0 3.5 - 5.1 mmol/L   Chloride 100 (L) 101 - 111 mmol/L   CO2 29 22 - 32 mmol/L   Glucose, Bld 88 65 - 99 mg/dL   BUN 20 6 - 20 mg/dL   Creatinine, Ser 9.600.72 0.44 - 1.00 mg/dL   Calcium 9.3 8.9 -  10.3 mg/dL   Total Protein 7.6 6.5 - 8.1 g/dL   Albumin 3.7 3.5 - 5.0 g/dL   AST 23 15 - 41 U/L   ALT 14 14 - 54 U/L   Alkaline Phosphatase 63 38 - 126 U/L   Total Bilirubin 0.8 0.3 - 1.2 mg/dL   GFR calc non Af Amer >60 >60 mL/min   GFR calc Af Amer >60 >60 mL/min   Anion gap 7 5 - 15  Lipase, blood  Result Value Ref Range   Lipase 21 11 - 51 U/L  CBC with Differential/Platelet  Result Value Ref Range   WBC 12.4 (H) 4.0 - 10.5 K/uL   RBC 4.87 3.87 - 5.11 MIL/uL   Hemoglobin 12.7 12.0 - 15.0 g/dL   HCT 16.139.1 09.636.0 - 04.546.0 %   MCV 80.3 78.0 - 100.0 fL   MCH 26.1 26.0 - 34.0 pg   MCHC 32.5 30.0 - 36.0 g/dL   RDW 40.916.8 (H) 81.111.5 - 91.415.5 %   Platelets 255 150 - 400 K/uL   Neutrophils Relative % 74 %   Neutro Abs 9.2 (H) 1.7 - 7.7 K/uL   Lymphocytes Relative 14 %   Lymphs Abs 1.7 0.7 - 4.0 K/uL   Monocytes Relative 11 %   Monocytes Absolute 1.4 (H) 0.1 - 1.0 K/uL   Eosinophils Relative 1 %   Eosinophils Absolute 0.1 0.0 - 0.7 K/uL   Basophils Relative 0 %   Basophils Absolute 0.0 0.0 - 0.1 K/uL     EKG  EKG Interpretation None       Radiology Ct Abdomen Pelvis W Contrast  Result Date:  09/24/2016 CLINICAL DATA:  Constant moderate rectal pain starting today. Unsure of last bowel movement. History of dementia. EXAM: CT ABDOMEN AND PELVIS WITH CONTRAST TECHNIQUE: Multidetector CT imaging of the abdomen and pelvis was performed using the standard protocol following bolus administration of intravenous contrast. CONTRAST:  100mL ISOVUE-300 IOPAMIDOL (ISOVUE-300) INJECTION 61% COMPARISON:  None. FINDINGS: Lower chest: Motion artifact. Interstitial changes suggested in the lung bases possibly representing edema or fibrosis. Cardiac enlargement. Coronary artery and aortic calcifications. Hepatobiliary: No focal liver abnormality is seen. No gallstones, gallbladder wall thickening, or biliary dilatation. Pancreas: Unremarkable. No pancreatic ductal dilatation or surrounding inflammatory changes. Spleen: Normal in size without focal abnormality. Adrenals/Urinary Tract: Intrarenal vascular calcifications. Nephrograms are symmetrical. No hydronephrosis or hydroureter. Bladder wall is not thickened. No adrenal gland nodules. Stomach/Bowel: Small esophageal hiatal hernia. Stomach is decompressed. Small bowel are not abnormally distended and contrast material flows through to the colon. Diffusely stool-filled colon. No colonic distention or wall thickening. Diverticula throughout the colon. No evidence of diverticulitis. Appendix is surgically absent. Appendiceal stump is normal. Vascular/Lymphatic: Aortic atherosclerosis. No enlarged abdominal or pelvic lymph nodes. Reproductive: Status post hysterectomy. No adnexal masses. Other: Moderate-sized ventral abdominal wall hernia containing fat. No bowel herniation. No free air or free fluid in the abdomen. Musculoskeletal: Degenerative changes in the spine. No destructive bone lesions. Probable vertebral hemangioma at L1 and L2. Degenerative changes likely cause significant central stenosis at L2-3, L3-4, and L4-5 levels. IMPRESSION: No evidence of bowel  obstruction or inflammation. Diverticulosis of the colon without evidence of diverticulitis. Colon is diffusely stool-filled. Moderate size ventral abdominal wall hernia containing fat. Cardiac enlargement. Aortic atherosclerosis. Degenerative changes in the spine. Electronically Signed   By: Burman NievesWilliam  Stevens M.D.   On: 09/24/2016 00:16    Procedures Procedures (including critical care time)  Medications Ordered in ED Medications  0.9 %  sodium chloride infusion ( Intravenous New Bag/Given 09/23/16 2224)  ondansetron (ZOFRAN) injection 4 mg (0 mg Intravenous Hold 09/23/16 2229)  iopamidol (ISOVUE-300) 61 % injection (not administered)  sodium chloride 0.9 % bolus 250 mL (250 mLs Intravenous New Bag/Given 09/23/16 2224)  iopamidol (ISOVUE-300) 61 % injection 100 mL (100 mLs Intravenous Contrast Given 09/23/16 2342)     Initial Impression / Assessment and Plan / ED Course  I have reviewed the triage vital signs and the nursing notes.  Pertinent labs & imaging results that were available during my care of the patient were reviewed by me and considered in my medical decision making (see chart for details).  Clinical Course    CT scan consistent with constipation. Rectal exam had some stool in the rectal vault but no impaction. Patient will be treated with MiraLAX. Patient's labs without significant abnormalities.   Final Clinical Impressions(s) / ED Diagnoses   Final diagnoses:  Constipation, unspecified constipation type    New Prescriptions New Prescriptions   POLYETHYLENE GLYCOL (MIRALAX) PACKET    Take 17 g by mouth daily.   I personally performed the services described in this documentation, which was scribed in my presence. The recorded information has been reviewed and is accurate.       Vanetta Mulders, MD 09/24/16 [phone]

## 2016-09-23 NOTE — ED Triage Notes (Signed)
Per family pt is constipated, does not remember when last had a BM, pt complaining of rectal pain

## 2016-09-24 MED ORDER — POLYETHYLENE GLYCOL 3350 17 G PO PACK: 17.0000 g | PACK | Freq: Every day | ORAL | 2 refills | Status: AC

## 2016-09-24 NOTE — Discharge Instructions (Signed)
Take the MiraLAX daily. For the next few days would recommend taking it 4 times a day. Very safe. CT scan without any acute findings other than some constipation. Return for any new or worse symptoms. Labs without significant abnormalities.

## 2016-12-30 ENCOUNTER — Other Ambulatory Visit: Payer: Self-pay | Admitting: Family Medicine

## 2017-01-30 ENCOUNTER — Ambulatory Visit: Payer: Medicare Other

## 2017-02-03 ENCOUNTER — Ambulatory Visit (INDEPENDENT_AMBULATORY_CARE_PROVIDER_SITE_OTHER): Payer: Medicare Other

## 2017-02-03 ENCOUNTER — Other Ambulatory Visit: Payer: Self-pay

## 2017-02-03 VITALS — BP 130/82 | HR 87 | Temp 99.2°F | Ht 60.0 in | Wt 181.1 lb

## 2017-02-03 DIAGNOSIS — Z0000 Encounter for general adult medical examination without abnormal findings: Secondary | ICD-10-CM

## 2017-02-03 NOTE — Patient Instructions (Signed)
[name] , Thank you for taking time to come for your Medicare Wellness Visit. I appreciate your ongoing commitment to your health goals. Please review the following plan we discussed and let me know if I can assist you in the future.   Screening recommendations/referrals: Colonoscopy: No longer required Mammogram: No longer required Bone Density: Up to date Recommended yearly ophthalmology/optometry visit for glaucoma screening and checkup Recommended yearly dental visit for hygiene and checkup  Vaccinations: Influenza vaccine: Up to date, next due 06/2017 Pneumococcal vaccine: Up to date Tdap vaccine: Overdue, this was due in 2015 Shingles vaccine Up to date    Advanced directives: Advance directive discussed with you today. I have provided a copy for you to complete at home and have notarized. Once this is complete please bring a copy in to our office so we can scan it into your chart.  Conditions/risks identified: Obese, recommend starting a routine exercise program at least 3 days a week for 30-45 minutes at a time as tolerated.   Next appointment: Follow up with Dr. Lodema Hong in 6 months for your complete physical. Follow up in 1 year for your annual wellness visit.   Preventive Care 55 Years and Older, Female Preventive care refers to lifestyle choices and visits with your health care provider that can promote health and wellness. What does preventive care include?  A yearly physical exam. This is also called an annual well check.  Dental exams once or twice a year.  Routine eye exams. Ask your health care provider how often you should have your eyes checked.  Personal lifestyle choices, including:  Daily care of your teeth and gums.  Regular physical activity.  Eating a healthy diet.  Avoiding tobacco and drug use.  Limiting alcohol use.  Practicing safe sex.  Taking low-dose aspirin every day.  Taking vitamin and mineral supplements as recommended by your  health care provider. What happens during an annual well check? The services and screenings done by your health care provider during your annual well check will depend on your age, overall health, lifestyle risk factors, and family history of disease. Counseling  Your health care provider may ask you questions about your:  Alcohol use.  Tobacco use.  Drug use.  Emotional well-being.  Home and relationship well-being.  Sexual activity.  Eating habits.  History of falls.  Memory and ability to understand (cognition).  Work and work Astronomer.  Reproductive health. Screening  You may have the following tests or measurements:  Height, weight, and BMI.  Blood pressure.  Lipid and cholesterol levels. These may be checked every 5 years, or more frequently if you are over 23 years old.  Skin check.  Lung cancer screening. You may have this screening every year starting at age 52 if you have a 30-pack-year history of smoking and currently smoke or have quit within the past 15 years.  Fecal occult blood test (FOBT) of the stool. You may have this test every year starting at age 28.  Flexible sigmoidoscopy or colonoscopy. You may have a sigmoidoscopy every 5 years or a colonoscopy every 10 years starting at age 54.  Hepatitis C blood test.  Hepatitis B blood test.  Sexually transmitted disease (STD) testing.  Diabetes screening. This is done by checking your blood sugar (glucose) after you have not eaten for a while (fasting). You may have this done every 1-3 years.  Bone density scan. This is done to screen for osteoporosis. You may have this done starting  at age 61.  Mammogram. This may be done every 1-2 years. Talk to your health care provider about how often you should have regular mammograms. Talk with your health care provider about your test results, treatment options, and if necessary, the need for more tests. Vaccines  Your health care provider may recommend  certain vaccines, such as:  Influenza vaccine. This is recommended every year.  Tetanus, diphtheria, and acellular pertussis (Tdap, Td) vaccine. You may need a Td booster every 10 years.  Zoster vaccine. You may need this after age 42.  Pneumococcal 13-valent conjugate (PCV13) vaccine. One dose is recommended after age 86.  Pneumococcal polysaccharide (PPSV23) vaccine. One dose is recommended after age 88. Talk to your health care provider about which screenings and vaccines you need and how often you need them. This information is not intended to replace advice given to you by your health care provider. Make sure you discuss any questions you have with your health care provider. Document Released: 11/17/2015 Document Revised: 07/10/2016 Document Reviewed: 08/22/2015 Elsevier Interactive Patient Education  2017 [employer] Prevention in the Home Falls can cause injuries. They can happen to people of all ages. There are many things you can do to make your home safe and to help prevent falls. What can I do on the outside of my home?  Regularly fix the edges of walkways and driveways and fix any cracks.  Remove anything that might make you trip as you walk through a door, such as a raised step or threshold.  Trim any bushes or trees on the path to your home.  Use bright outdoor lighting.  Clear any walking paths of anything that might make someone trip, such as rocks or tools.  Regularly check to see if handrails are loose or broken. Make sure that both sides of any steps have handrails.  Any raised decks and porches should have guardrails on the edges.  Have any leaves, snow, or ice cleared regularly.  Use sand or salt on walking paths during winter.  Clean up any spills in your garage right away. This includes oil or grease spills. What can I do in the bathroom?  Use night lights.  Install grab bars by the toilet and in the tub and shower. Do not use towel bars as  grab bars.  Use non-skid mats or decals in the tub or shower.  If you need to sit down in the shower, use a plastic, non-slip stool.  Keep the floor dry. Clean up any water that spills on the floor as soon as it happens.  Remove soap buildup in the tub or shower regularly.  Attach bath mats securely with double-sided non-slip rug tape.  Do not have throw rugs and other things on the floor that can make you trip. What can I do in the bedroom?  Use night lights.  Make sure that you have a light by your bed that is easy to reach.  Do not use any sheets or blankets that are too big for your bed. They should not hang down onto the floor.  Have a firm chair that has side arms. You can use this for support while you get dressed.  Do not have throw rugs and other things on the floor that can make you trip. What can I do in the kitchen?  Clean up any spills right away.  Avoid walking on wet floors.  Keep items that you use a lot in easy-to-reach places.  If  you need to reach something above you, use a strong step stool that has a grab bar.  Keep electrical cords out of the way.  Do not use floor polish or wax that makes floors slippery. If you must use wax, use non-skid floor wax.  Do not have throw rugs and other things on the floor that can make you trip. What can I do with my stairs?  Do not leave any items on the stairs.  Make sure that there are handrails on both sides of the stairs and use them. Fix handrails that are broken or loose. Make sure that handrails are as long as the stairways.  Check any carpeting to make sure that it is firmly attached to the stairs. Fix any carpet that is loose or worn.  Avoid having throw rugs at the top or bottom of the stairs. If you do have throw rugs, attach them to the floor with carpet tape.  Make sure that you have a light switch at the top of the stairs and the bottom of the stairs. If you do not have them, ask someone to add them  for you. What else can I do to help prevent falls?  Wear shoes that:  Do not have high heels.  Have rubber bottoms.  Are comfortable and fit you well.  Are closed at the toe. Do not wear sandals.  If you use a stepladder:  Make sure that it is fully opened. Do not climb a closed stepladder.  Make sure that both sides of the stepladder are locked into place.  Ask someone to hold it for you, if possible.  Clearly mark and make sure that you can see:  Any grab bars or handrails.  First and last steps.  Where the edge of each step is.  Use tools that help you move around (mobility aids) if they are needed. These include:  Canes.  Walkers.  Scooters.  Crutches.  Turn on the lights when you go into a dark area. Replace any light bulbs as soon as they burn out.  Set up your furniture so you have a clear path. Avoid moving your furniture around.  If any of your floors are uneven, fix them.  If there are any pets around you, be aware of where they are.  Review your medicines with your doctor. Some medicines can make you feel dizzy. This can increase your chance of falling. Ask your doctor what other things that you can do to help prevent falls. This information is not intended to replace advice given to you by your health care provider. Make sure you discuss any questions you have with your health care provider. Document Released: 08/17/2009 Document Revised: 03/28/2016 Document Reviewed: 11/25/2014 Elsevier Interactive Patient Education  2017 [employer].

## 2017-02-03 NOTE — Progress Notes (Signed)
Subjective:   [name] is a 83 y.o. female who presents for Medicare Annual (Subsequent) preventive examination.  Review of Systems:  Cardiac Risk Factors include: advanced age (>76men, >26 women);dyslipidemia;hypertension;obesity (BMI >30kg/m2);sedentary lifestyle;smoking/ tobacco exposure     Objective:     Vitals: BP 130/82   Pulse 87   Temp 99.2 F (37.3 C) (Oral)   Ht 5' (1.524 m)   Wt 181 lb 1.9 oz (82.2 kg)   SpO2 90%   BMI 35.37 kg/m   Body mass index is 35.37 kg/m.   Tobacco History  Smoking Status  . Current Some Day Smoker  . Packs/day: 0.25  . Types: Cigarettes  Smokeless Tobacco  . Never Used    Comment: 1 or 2 cigs a day     Ready to quit: Not Answered Counseling given: Not Answered   Past Medical History:  Diagnosis Date  . Colon polyps   . Coronary atherosclerosis of native coronary artery October 2005.   Cardiac cath revealed 70% circumflex stenosis. Treated medically. Ejection fraction 55-65%.  . Dementia   . Depression    no symptoms expressed in 2014  . Diastolic dysfunction 02/2012  . Essential hypertension, benign   . Hyperlipidemia    diet controlled, med compliance a prob  . Myocardial infarction October 2005  . Obesity    Past Surgical History:  Procedure Laterality Date  . ABDOMINAL HYSTERECTOMY    . APPENDECTOMY     Age 11  . VESICOVAGINAL FISTULA CLOSURE W/ TAH     Family History  Problem Relation Age of Onset  . Heart attack Mother   . Cancer Mother     Breast   . Alcohol abuse Father   . Heart attack Sister   . Diabetes Sister    History  Sexual Activity  . Sexual activity: No    Outpatient Encounter Prescriptions as of 02/03/2017  Medication Sig  . aspirin EC 81 MG tablet Take 1 tablet (81 mg total) by mouth daily.  . metoprolol succinate (TOPROL-XL) 25 MG 24 hr tablet TAKE 1 TABLET BY MOUTH ONCE DAILY.  Marland Kitchen polyethylene glycol (MIRALAX) packet Take 17 g by mouth daily. (Patient taking differently: Take  17 g by mouth daily as needed. )  . Vitamin D, Ergocalciferol, (DRISDOL) 50000 units CAPS capsule TAKE 1 CAPSULE BY MOUTH ONCE WEEKLY.   No facility-administered encounter medications on file as of 02/03/2017.     Activities of Daily Living In your present state of health, do you have any difficulty performing the following activities: 02/03/2017 03/25/2016  Hearing? N N  Vision? N N  Difficulty concentrating or making decisions? Malvin Johns  Walking or climbing stairs? N Y  Dressing or bathing? N Y  Doing errands, shopping? Malvin Johns  Preparing Food and eating ? Y -  Using the Toilet? N -  In the past six months, have you accidently leaked urine? N -  Do you have problems with loss of bowel control? N -  Managing your Medications? Y -  Managing your Finances? Y -  Housekeeping or managing your Housekeeping? Y -  Some recent data might be hidden    Patient Care Team: Kerri Perches, MD as PCP - General    Assessment:    Exercise Activities and Dietary recommendations Current Exercise Habits: The patient does not participate in regular exercise at present, Exercise limited by: None identified  Goals    . Exercise 3x per week (30 min per time)  Recommend starting a routine exercise program at least 3 days a week for 30-45 minutes at a time as tolerated.        Fall Risk Fall Risk  02/03/2017 03/25/2016 01/22/2016 01/16/2015 09/15/2014  Falls in the past year? No Yes No No No  Number falls in past yr: - 2 or more - - -  Injury with Fall? - Yes - - -  Risk Factor Category  - High Fall Risk - - -  Risk for fall due to : - History of fall(s);Impaired balance/gait - - -   Depression Screen PHQ 2/9 Scores 02/03/2017 03/25/2016 09/15/2014 09/16/2013  PHQ - 2 Score 0 0 0 0     Cognitive Function MMSE - Mini Mental State Exam 02/03/2017 03/25/2016 01/16/2015  Orientation to time 0 0 0  Orientation to Place Registration Attention/ Calculation Recall 0 0 0  Language-  name 2 objects Language- repeat Language- follow 3 step command Language- read & follow direction 1 0 1  Write a sentence 1 0 1  Copy design 0 1 0  Total score Immunization History  Administered Date(s) Administered  . H1N1 11/15/2008  . Influenza Split 07/22/2012  . Influenza Whole 08/04/2007, 08/02/2008, 09/06/2009, 08/07/2010, 07/12/2011  . Influenza,inj,Quad PF,36+ Mos 09/16/2013, 09/15/2014, 08/23/2015, 08/21/2016  . Pneumococcal Conjugate-13 05/16/2014  . Pneumococcal Polysaccharide-23 05/22/2004  . Td 05/12/2004  . Zoster 03/19/2007   Screening Tests Health Maintenance  Topic Date Due  . TETANUS/TDAP  05/12/2014  . INFLUENZA VACCINE  06/04/2017  . DEXA SCAN  Completed  . PNA vac Low Risk Adult  Completed      Plan:    I have personally reviewed and addressed the Medicare Annual Wellness questionnaire and have noted the following in the patient's chart:  A. Medical and social history B. Use of alcohol, tobacco or illicit drugs  C. Current medications and supplements D. Functional ability and status E.  Nutritional status F.  Physical activity G. Advance directives H. List of other physicians I.  Hospitalizations, surgeries, and ER visits in previous 12 months J.  Vitals K. Screenings to include cognitive, depression, and falls L. Referrals and appointments - none  In addition, I have reviewed and discussed with patient certain preventive protocols, quality metrics, and best practice recommendations. A written personalized care plan for preventive services as well as general preventive health recommendations were provided to patient.  Signed,   Candis Shine, LPN Lead Nurse Health Advisor

## 2017-02-04 MED ORDER — VITAMIN D (ERGOCALCIFEROL) 1.25 MG (50000 UNIT) PO CAPS: 50000.0000 [IU] | ORAL_CAPSULE | ORAL | 1 refills | Status: DC

## 2017-05-30 ENCOUNTER — Other Ambulatory Visit: Payer: Self-pay | Admitting: Family Medicine

## 2017-07-03 ENCOUNTER — Other Ambulatory Visit: Payer: Self-pay | Admitting: Family Medicine

## 2017-08-05 ENCOUNTER — Encounter: Payer: Medicare Other | Admitting: Family Medicine

## 2017-08-22 ENCOUNTER — Other Ambulatory Visit: Payer: Self-pay | Admitting: Family Medicine

## 2017-09-01 ENCOUNTER — Encounter: Payer: Medicare Other | Admitting: Family Medicine

## 2017-09-23 ENCOUNTER — Ambulatory Visit (INDEPENDENT_AMBULATORY_CARE_PROVIDER_SITE_OTHER): Payer: Medicare Other | Admitting: Family Medicine

## 2017-09-23 ENCOUNTER — Encounter: Payer: Self-pay | Admitting: Family Medicine

## 2017-09-23 VITALS — BP 140/74 | HR 65 | Resp 16 | Ht 60.0 in | Wt 172.0 lb

## 2017-09-23 DIAGNOSIS — E559 Vitamin D deficiency, unspecified: Secondary | ICD-10-CM | POA: Diagnosis not present

## 2017-09-23 DIAGNOSIS — Z23 Encounter for immunization: Secondary | ICD-10-CM

## 2017-09-23 DIAGNOSIS — F17210 Nicotine dependence, cigarettes, uncomplicated: Secondary | ICD-10-CM

## 2017-09-23 DIAGNOSIS — I10 Essential (primary) hypertension: Secondary | ICD-10-CM

## 2017-09-23 DIAGNOSIS — R7303 Prediabetes: Secondary | ICD-10-CM

## 2017-09-23 DIAGNOSIS — Z0000 Encounter for general adult medical examination without abnormal findings: Secondary | ICD-10-CM | POA: Diagnosis not present

## 2017-09-23 DIAGNOSIS — F17218 Nicotine dependence, cigarettes, with other nicotine-induced disorders: Secondary | ICD-10-CM

## 2017-09-23 DIAGNOSIS — E785 Hyperlipidemia, unspecified: Secondary | ICD-10-CM

## 2017-09-23 NOTE — Patient Instructions (Signed)
Wellness with nurse in May  Physical exam with mD 23 , 2019, call if you need me sooner  Flu vacine today  Olease stop smoking  Please do not fall and uswe walker all the time  Continue to enjoy life   CBC, lipid, cmp and EGFr, TSH, Vit D today

## 2017-09-24 LAB — LIPID PANEL
Cholesterol: 181 mg/dL (ref <200)
HDL: 59 mg/dL (ref >50)
LDL CHOLESTEROL (CALC): 105 mg/dL — AB
Non-HDL Cholesterol (Calc): 122 mg/dL (calc) (ref <130)
TRIGLYCERIDES: 82 mg/dL (ref <150)
Total CHOL/HDL Ratio: 3.1 (calc) (ref <5.0)

## 2017-09-24 LAB — COMPLETE METABOLIC PANEL WITH GFR
AG RATIO: 1.2 (calc) (ref 1.0–2.5)
ALBUMIN MSPROF: 3.9 g/dL (ref 3.6–5.1)
ALKALINE PHOSPHATASE (APISO): 72 U/L (ref 33–130)
ALT: 11 U/L (ref 6–29)
AST: 18 U/L (ref 10–35)
BILIRUBIN TOTAL: 0.4 mg/dL (ref 0.2–1.2)
BUN: 21 mg/dL (ref 7–25)
CHLORIDE: 97 mmol/L — AB (ref 98–110)
CO2: 32 mmol/L (ref 20–32)
Calcium: 9.9 mg/dL (ref 8.6–10.4)
Creat: 0.79 mg/dL (ref 0.60–0.88)
GFR, EST AFRICAN AMERICAN: 77 mL/min/{1.73_m2} (ref >=60)
GFR, Est Non African American: 66 mL/min/{1.73_m2} (ref >=60)
Globulin: 3.2 g/dL (calc) (ref 1.9–3.7)
Glucose, Bld: 99 mg/dL (ref 65–139)
POTASSIUM: 5 mmol/L (ref 3.5–5.3)
SODIUM: 137 mmol/L (ref 135–146)
TOTAL PROTEIN: 7.1 g/dL (ref 6.1–8.1)

## 2017-09-24 LAB — TSH: TSH: 1.61 mIU/L (ref 0.40–4.50)

## 2017-09-24 LAB — VITAMIN D 25 HYDROXY (VIT D DEFICIENCY, FRACTURES): VIT D 25 HYDROXY: 51 ng/mL (ref 30–100)

## 2017-09-26 ENCOUNTER — Encounter: Payer: Self-pay | Admitting: Family Medicine

## 2017-09-26 NOTE — Assessment & Plan Note (Signed)
After obtaining informed consent, the vaccine is  administered by LPN.  

## 2017-09-26 NOTE — Assessment & Plan Note (Signed)
Annual exam as documented.  Immunization a needs are specifically addressed at this visit.

## 2017-09-26 NOTE — Assessment & Plan Note (Signed)

## 2017-09-26 NOTE — Progress Notes (Signed)
    [name]     MRN: [id number]      DOB: 16-Jun-1926  HPI: Patient is in for annual physical exam. C/o intermittent dry cough, no fever, chill, and good appetite Labs are obtained on the day of the visit and are reviewed as a part of the visit. Immunization is reviewed , and  updated.   PE: BP 140/74   Pulse 65   Resp 16   Ht 5' (1.524 m)   Wt 172 lb (78 kg)   SpO2 97%   BMI 33.59 kg/m  Pleasant  female, alert  in no cardio-pulmonary distress. Afebrile. HEENT No facial trauma or asymetry. Sinuses non tender.  Extra occullar muscles intact,  External ears normal, tympanic membranes clear. Oropharynx moist, no exudate. Neck: decreased ROM, no adenopathy,JVD or thyromegaly.No bruits.  Chest: Clear to ascultation bilaterally.No crackles or wheezes. Non tender to palpation  Breast: No asymetry,no masses or lumps. No tenderness. No nipple discharge or inversion. No axillary or supraclavicular adenopathy  Cardiovascular system; Heart sounds normal,  S1 and  S2 ,no S3.  No murmur, or thrill. Apical beat not displaced Peripheral pulses normal.  Abdomen: Soft, non tender, no organomegaly or masses. No bruits. Bowel sounds normal. No guarding, tenderness or rebound.  Rectal:  Not done, pt has aged out of colon cancers screening, she is asymptomatic and has sevre dementia.  GU: Not examined  Musculoskeletal exam: Decreased  ROM of spine, hips , shoulders and knees. Deformity ,and  crepitus noted.  Neurologic: Cranial nerves 2 to 12 intact. Power, tone ,sensation  normal throughout. Abnormal gait, needs assistive device for ambulation.  Skin: Intact, no ulceration, erythema , scaling or rash noted. Pigmentation normal throughout  Psych; Normal mood and affect. Judgement and concentration normal   Assessment & Plan:  Annual physical exam Annual exam as documented.  Immunization a needs are specifically addressed at this visit.   Need for  prophylactic vaccination and inoculation against influenza After obtaining informed consent, the vaccine is  administered by LPN.   Nicotine dependence Patient is asked and  confirms current  Nicotine use.  Five to seven minutes of time is spent in counseling the patient of the need to quit smoking  Advice to quit is delivered clearly specifically in reducing the risk of developing heart disease, having a stroke, or of developing all types of cancer, especially lung and oral cancer. Improvement in breathing and exercise tolerance and quality of life is also discussed, as is the economic benefit.  Assessment of willingness to quit or to make an attempt to quit is made and documented  Assistance in quit attempt is made with several and varied options presented, based on patient's desire and need. These include  literature, local classes available, 1800 QUIT NOW number, OTC and prescription medication.  The GOAL to be NICOTINE FREE is re emphasized.  The patient has set a personal goal of either reduction or discontinuation and follow up is arranged between 6 an 16 weeks.

## 2017-10-22 ENCOUNTER — Other Ambulatory Visit: Payer: Self-pay | Admitting: Family Medicine

## 2017-10-22 NOTE — Telephone Encounter (Signed)
Seen 11 20 18 

## 2017-12-08 ENCOUNTER — Other Ambulatory Visit: Payer: Self-pay | Admitting: Family Medicine

## 2018-01-27 ENCOUNTER — Encounter (HOSPITAL_COMMUNITY): Payer: Self-pay | Admitting: Cardiology

## 2018-01-27 ENCOUNTER — Emergency Department (HOSPITAL_COMMUNITY): Payer: Medicare Other

## 2018-01-27 ENCOUNTER — Observation Stay (HOSPITAL_COMMUNITY)
Admission: EM | Admit: 2018-01-27 | Discharge: 2018-01-29 | Disposition: A | Discharge status: Home / Self Care | Payer: Medicare Other | Attending: Family Medicine | Admitting: Family Medicine

## 2018-01-27 DIAGNOSIS — I252 Old myocardial infarction: Secondary | ICD-10-CM | POA: Insufficient documentation

## 2018-01-27 DIAGNOSIS — R0602 Shortness of breath: Secondary | ICD-10-CM | POA: Diagnosis not present

## 2018-01-27 DIAGNOSIS — I2510 Atherosclerotic heart disease of native coronary artery without angina pectoris: Secondary | ICD-10-CM | POA: Insufficient documentation

## 2018-01-27 DIAGNOSIS — Z79899 Other long term (current) drug therapy: Secondary | ICD-10-CM | POA: Insufficient documentation

## 2018-01-27 DIAGNOSIS — K5900 Constipation, unspecified: Secondary | ICD-10-CM | POA: Insufficient documentation

## 2018-01-27 DIAGNOSIS — F17210 Nicotine dependence, cigarettes, uncomplicated: Secondary | ICD-10-CM | POA: Diagnosis not present

## 2018-01-27 DIAGNOSIS — R7303 Prediabetes: Secondary | ICD-10-CM | POA: Diagnosis not present

## 2018-01-27 DIAGNOSIS — E785 Hyperlipidemia, unspecified: Secondary | ICD-10-CM | POA: Diagnosis not present

## 2018-01-27 DIAGNOSIS — F0390 Unspecified dementia without behavioral disturbance: Secondary | ICD-10-CM | POA: Insufficient documentation

## 2018-01-27 DIAGNOSIS — I10 Essential (primary) hypertension: Secondary | ICD-10-CM | POA: Insufficient documentation

## 2018-01-27 DIAGNOSIS — R079 Chest pain, unspecified: Secondary | ICD-10-CM | POA: Diagnosis not present

## 2018-01-27 DIAGNOSIS — J441 Chronic obstructive pulmonary disease with (acute) exacerbation: Principal | ICD-10-CM | POA: Insufficient documentation

## 2018-01-27 DIAGNOSIS — R05 Cough: Secondary | ICD-10-CM | POA: Diagnosis not present

## 2018-01-27 DIAGNOSIS — Z720 Tobacco use: Secondary | ICD-10-CM

## 2018-01-27 DIAGNOSIS — Z7982 Long term (current) use of aspirin: Secondary | ICD-10-CM | POA: Diagnosis not present

## 2018-01-27 LAB — CBC WITH DIFFERENTIAL/PLATELET
BASOS PCT: 0 %
Basophils Absolute: 0 10*3/uL (ref 0.0–0.1)
EOS ABS: 0.3 10*3/uL (ref 0.0–0.7)
Eosinophils Relative: 4 %
HEMATOCRIT: 40.1 % (ref 36.0–46.0)
HEMOGLOBIN: 12.4 g/dL (ref 12.0–15.0)
LYMPHS ABS: 2.3 10*3/uL (ref 0.7–4.0)
Lymphocytes Relative: 28 %
MCH: 26.5 pg (ref 26.0–34.0)
MCHC: 30.9 g/dL (ref 30.0–36.0)
MCV: 85.7 fL (ref 78.0–100.0)
Monocytes Absolute: 1 10*3/uL (ref 0.1–1.0)
Monocytes Relative: 12 %
NEUTROS ABS: 4.5 10*3/uL (ref 1.7–7.7)
NEUTROS PCT: 56 %
Platelets: 237 10*3/uL (ref 150–400)
RBC: 4.68 MIL/uL (ref 3.87–5.11)
RDW: 15.2 % (ref 11.5–15.5)
WBC: 8.1 10*3/uL (ref 4.0–10.5)

## 2018-01-27 LAB — COMPREHENSIVE METABOLIC PANEL
ALBUMIN: 3.4 g/dL — AB (ref 3.5–5.0)
ALT: 14 U/L (ref 14–54)
AST: 19 U/L (ref 15–41)
Alkaline Phosphatase: 79 U/L (ref 38–126)
Anion gap: 11 (ref 5–15)
BILIRUBIN TOTAL: 0.8 mg/dL (ref 0.3–1.2)
BUN: 17 mg/dL (ref 6–20)
CO2: 26 mmol/L (ref 22–32)
CREATININE: 0.63 mg/dL (ref 0.44–1.00)
Calcium: 9.5 mg/dL (ref 8.9–10.3)
Chloride: 102 mmol/L (ref 101–111)
GFR calc Af Amer: >60 mL/min (ref >60)
GFR calc non Af Amer: >60 mL/min (ref >60)
GLUCOSE: 88 mg/dL (ref 65–99)
Potassium: 4.1 mmol/L (ref 3.5–5.1)
SODIUM: 139 mmol/L (ref 135–145)
TOTAL PROTEIN: 7.2 g/dL (ref 6.5–8.1)

## 2018-01-27 LAB — TROPONIN I: Troponin I: <0.03 ng/mL (ref <0.03)

## 2018-01-27 LAB — CBG MONITORING, ED: Glucose-Capillary: 78 mg/dL (ref 65–99)

## 2018-01-27 LAB — URINALYSIS, ROUTINE W REFLEX MICROSCOPIC
BILIRUBIN URINE: NEGATIVE
GLUCOSE, UA: NEGATIVE mg/dL
HGB URINE DIPSTICK: NEGATIVE
KETONES UR: NEGATIVE mg/dL
LEUKOCYTES UA: NEGATIVE
Nitrite: NEGATIVE
PH: 9 — AB (ref 5.0–8.0)
PROTEIN: NEGATIVE mg/dL
Specific Gravity, Urine: 1.006 (ref 1.005–1.030)

## 2018-01-27 LAB — BRAIN NATRIURETIC PEPTIDE: B Natriuretic Peptide: 95 pg/mL (ref 0.0–100.0)

## 2018-01-27 MED ORDER — IPRATROPIUM-ALBUTEROL 0.5-2.5 (3) MG/3ML IN SOLN
3.0000 mL | Freq: Once | RESPIRATORY_TRACT | Status: AC
Start: 2018-01-27 — End: 2018-01-27
  Administered 2018-01-27: 3 mL via RESPIRATORY_TRACT
  Filled 2018-01-27: qty 3

## 2018-01-27 MED ORDER — METHYLPREDNISOLONE SODIUM SUCC 125 MG IJ SOLR
125.0000 mg | Freq: Once | INTRAMUSCULAR | Status: AC
  Administered 2018-01-28: 125 mg via INTRAVENOUS
  Filled 2018-01-27: qty 2

## 2018-01-27 NOTE — ED Triage Notes (Signed)
Pt appeared to be unresponsive in car.  Hard to arouse.  Pt c/o sob.  Told caregiver earlier that her chest was hurting.

## 2018-01-27 NOTE — H&P (Signed)
History and Physical    [name]:[id number] DOB: [birth year] DOA: 01/27/2018  PCP: Kerri Perches, MD   Patient coming from: Home.  I have personally briefly reviewed patient's old medical records in [facility]  Chief Complaint: Shortness of breath.  HPI: [name] is a 84 y.o. female with medical history significant of colon polyps, CAD able native coronary artery, history of MI, dementia, depression, diastolic dysfunction, essential hypertension, hyperlipidemia, obesity who is coming to the emergency department due to shortness of breath since about 1800 today, associated with cough with clear sputum.  There has been no fever, no chills, rhinorrhea, sore throat, sick contacts or travel history.  She denies chest pain, palpitations, dizziness, diaphoresis, or PND.  She gets occasional mild lower extremity edema and occasional orthopnea.  Denies abdominal pain, nausea, emesis, diarrhea, melena or hematochezia.  She gets frequently constipated and uses MiraLAX.  No dysuria, frequency or hematuria.  Denies polyphagia, polydipsia, polyuria or blurred vision.  Denies skin rashes or pruritus.  ED Course: Her temperature was 37.2 C (99 F), pulse 77, respirations 18, blood pressure 198/91 mmHg and O2 sat 94% on room air.  She received albuterol 2.5 mg with ipratropium 0.5 mg via nebulizer.  She states that she feels better.  I added Solu-Medrol 125 mg IVP x1.  Her urinalysis showed a pH of 9.0, but was otherwise unremarkable.  CBC shows a white count of 8.1, hemoglobin 12.4 and platelets 237.  Her CMP was normal except for an albumin of 3.4 g/dL.  Magnesium was 2.1 and phosphorus 3.1 mg/dL. EKG shows sinus rhythm with PACs, left axis deviation and previous septal infarct.  Troponin was less than 0.03 ng/mL.  BNP was 95.0 pg/mL.  Imaging: A 1 view chest radiograph shows combined bilateral pulmonary interstitial and airspace opacity which is nonspecific.  Pulmonary edema,  bilateral pneumonia versus developing ARDS to be considered as possible etiologies.  Also chronic elevation of the right hemidiaphragm and aortic atherosclerosis.  Review of Systems: As per HPI otherwise 10 point review of systems negative.    Past Medical History:  Diagnosis Date  . Colon polyps   . Coronary atherosclerosis of native coronary artery October 2005.   Cardiac cath revealed 70% circumflex stenosis. Treated medically. Ejection fraction 55-65%.  . Dementia   . Depression    no symptoms expressed in 2014  . Diastolic dysfunction 02/2012  . Essential hypertension, benign   . Hyperlipidemia    diet controlled, med compliance a prob  . Myocardial infarction [facility]) October 2005  . Obesity     Past Surgical History:  Procedure Laterality Date  . ABDOMINAL HYSTERECTOMY    . APPENDECTOMY     Age 37  . VESICOVAGINAL FISTULA CLOSURE W/ TAH       reports that she has been smoking cigarettes.  She has been smoking about 0.25 packs per day. She has never used smokeless tobacco. She reports that she does not drink alcohol or use drugs.  No Known Allergies  Family History  Problem Relation Age of Onset  . Heart attack Mother   . Cancer Mother        Breast   . Alcohol abuse Father   . Heart attack Sister   . Diabetes Sister     Prior to Admission medications   Medication Sig Start Date End Date Taking? Authorizing Provider  aspirin EC 81 MG tablet Take 1 tablet (81 mg total) by mouth daily. 09/24/13  Yes  Kerri PerchesSimpson, Margaret E, MD  metoprolol succinate (TOPROL-XL) 25 MG 24 hr tablet TAKE 1 TABLET BY MOUTH ONCE DAILY. 10/22/17  Yes Kerri PerchesSimpson, Margaret E, MD  polyethylene glycol [facility](MIRALAX) packet Take 17 g by mouth daily. Patient taking differently: Take 17 g by mouth daily as needed.  09/24/16  Yes Vanetta MuldersZackowski, Scott, MD  Vitamin D, Ergocalciferol, (DRISDOL) 50000 units CAPS capsule TAKE 1 CAPSULE BY MOUTH ONCE WEEKLY. 12/11/17  Yes Kerri PerchesSimpson, Margaret E, MD    Physical  Exam: Vitals:   01/27/18 2030 01/27/18 2100 01/27/18 2130 01/27/18 2200  BP: (!) 160/57 (!) 179/58 (!) 147/69 (!) 142/66  Pulse: 77 75 78   Resp: (!) 34 16 16 20   Temp:      TempSrc:      SpO2: 100% 99% 97%   Weight:      Height:        Constitutional: NAD, calm, comfortable Eyes: PERRL, lids and conjunctivae normal ENMT: Mucous membranes are moist. Posterior pharynx clear of any exudate or lesions.  Neck: normal, supple, no masses, no thyromegaly Respiratory: Decreased breath sounds with mild wheezing, rhonchi and scattered crackles throughout. Normal respiratory effort. No accessory muscle use.  Cardiovascular: Regular rate with occasional PVCs, positive 1/6 SEM, no rubs / gallops.  1+ lower extremities pitting edema. 2+ pedal pulses. No carotid bruits.  Abdomen: Soft, no tenderness, no masses palpated. No hepatosplenomegaly. Bowel sounds positive.  Musculoskeletal: no clubbing / cyanosis. Good ROM, no contractures. Normal muscle tone.  Skin: no rashes, lesions, ulcers on very limited dermatological examination Neurologic: CN 2-12 grossly intact. Sensation intact, DTR normal. Strength 5/5 in all 4.  Psychiatric: Normal judgment and insight. Alert and oriented x 3. Normal mood.    Labs on Admission: I have personally reviewed following labs and imaging studies  CBC: Recent Labs  Lab 01/27/18 2035  WBC 8.1  NEUTROABS 4.5  HGB 12.4  HCT 40.1  MCV 85.7  PLT 237   Basic Metabolic Panel: Recent Labs  Lab 01/27/18 2035  NA 139  K 4.1  CL 102  CO2 26  GLUCOSE 88  BUN 17  CREATININE 0.63  CALCIUM 9.5   GFR: Estimated Creatinine Clearance: 46.5 mL/min (by C-G formula based on SCr of 0.63 mg/dL). Liver Function Tests: Recent Labs  Lab 01/27/18 2035  AST 19  ALT 14  ALKPHOS 79  BILITOT 0.8  PROT 7.2  ALBUMIN 3.4*   No results for input(s): LIPASE, AMYLASE in the last 168 hours. No results for input(s): AMMONIA in the last 168 hours. Coagulation Profile: No  results for input(s): INR, PROTIME in the last 168 hours. Cardiac Enzymes: Recent Labs  Lab 01/27/18 2035  TROPONINI <0.03   BNP (last 3 results) No results for input(s): PROBNP in the last 8760 hours. HbA1C: No results for input(s): HGBA1C in the last 72 hours. CBG: Recent Labs  Lab 01/27/18 1850  GLUCAP 78   Lipid Profile: No results for input(s): CHOL, HDL, LDLCALC, TRIG, CHOLHDL, LDLDIRECT in the last 72 hours. Thyroid Function Tests: No results for input(s): TSH, T4TOTAL, FREET4, T3FREE, THYROIDAB in the last 72 hours. Anemia Panel: No results for input(s): VITAMINB12, FOLATE, FERRITIN, TIBC, IRON, RETICCTPCT in the last 72 hours. Urine analysis:    Component Value Date/Time   COLORURINE STRAW (A) 01/27/2018 1922   APPEARANCEUR CLEAR 01/27/2018 1922   LABSPEC 1.006 01/27/2018 1922   PHURINE 9.0 (H) 01/27/2018 1922   GLUCOSEU NEGATIVE 01/27/2018 1922   HGBUR NEGATIVE 01/27/2018 1922   HGBUR trace-lysed  01/08/2011 1122   BILIRUBINUR NEGATIVE 01/27/2018 1922   KETONESUR NEGATIVE 01/27/2018 1922   PROTEINUR NEGATIVE 01/27/2018 1922   UROBILINOGEN 1.0 01/08/2011 1122   NITRITE NEGATIVE 01/27/2018 1922   LEUKOCYTESUR NEGATIVE 01/27/2018 1922    Radiological Exams on Admission: Dg Chest Portable 1 View  Result Date: 01/27/2018 CLINICAL DATA:  84 year old female with shortness of breath and chest pain today. Difficult to arouse. EXAM: PORTABLE CHEST 1 VIEW COMPARISON:  01/30/2012 and earlier. FINDINGS: Portable AP upright view at 1945 hours. Low lung volumes with combined bilateral interstitial and patchy perihilar airspace opacity. No pneumothorax. No large pleural effusion. Chronic elevation of the right hemidiaphragm is stable since 2012. Calcified aortic atherosclerosis. Partially obscured mediastinal contours. Visualized tracheal air column is within normal limits. Paucity of bowel gas in the upper abdomen. Advanced degenerative changes at both shoulders. No acute  osseous abnormality identified. IMPRESSION: 1. Combined bilateral pulmonary interstitial and airspace opacity is nonspecific. Consider acute pulmonary edema, bilateral pneumonia, and developing ARDS. 2. No pneumothorax or large pleural effusion. Chronic elevation of the right hemidiaphragm. 3.  Aortic Atherosclerosis (ICD10-I70.0). Electronically Signed   By: Odessa Fleming M.D.   On: 01/27/2018 20:19    EKG: Independently reviewed. Vent. rate 80 BPM PR interval 146 ms QRS duration 80 ms QT/QTc 388/447 ms P-R-T axes 76 -44 64 Sinus rhythm with Premature atrial complexes Left axis deviation Septal infarct , age undetermined Abnormal ECG  Assessment/Plan Principal Problem:   COPD exacerbation (HCC) Observation/telemetry. Continue supplemental oxygen. DuoNeb every 6 hours. Albuterol 2.5 mg every 4 hours as needed. Solu-Medrol 125 mg IVP x1. Solu-Medrol 40 mg IVP every 12 hours x2 doses starting 1200 tomorrow. Check CT of chest without contrast to better evaluate chest radiograph findings. Levaquin 500 mg IVPB x1, then 500 p.o. daily every day. The patient is to follow-up with pulmonology as an outpatient. Smoking cessation advised and support provided.  Active Problems:   Coronary atherosclerosis of native coronary artery Continue aspirin and beta-blocker. The patient is not on medication for hyperlipidemia.    Prediabetes Carbohydrate modified diet. CBG monitoring with regular insulin sliding scale while receiving glucocorticoids.    Hyperlipidemia LDL goal <100 Not on medications. Continue lifestyle modifications.    Dementia Mild form. Continue supportive care. Smoking cessation encouraged.    Essential hypertension, benign Continue metoprolol succinate 25 mg p.o. daily. Monitor blood pressure and heart rate.    Constipation Continue MiraLAX.    Tobacco use Nicotine replacement therapy as needed order. Staff to provide smoking cessation information.   DVT  prophylaxis: Lovenox SQ. Code Status: Full code. Family Communication: Her cousin Elnita Maxwell was present in the ED Disposition Plan: Admit for COPD exacerbation treatment. Consults called:  Admission status: Observation/telemetry.   Bobette Mo MD [facility] Pager [phone].  If 7PM-7AM, please contact night-coverage [email or web address] Password [email or web address]  01/27/2018, 10:58 PM

## 2018-01-27 NOTE — ED Notes (Signed)
Pt able to ambulate with Dan HumphreysWalker brought in from home and provide Urine Sample. In and Out Cath not needed at this time. Pt states, after receiving her breathing treatment she can breath easier. Pt no longer tachypnic

## 2018-01-28 ENCOUNTER — Observation Stay (HOSPITAL_BASED_OUTPATIENT_CLINIC_OR_DEPARTMENT_OTHER): Payer: Medicare Other

## 2018-01-28 ENCOUNTER — Observation Stay (HOSPITAL_COMMUNITY): Payer: Medicare Other

## 2018-01-28 ENCOUNTER — Encounter (HOSPITAL_COMMUNITY): Payer: Self-pay | Admitting: *Deleted

## 2018-01-28 ENCOUNTER — Other Ambulatory Visit: Payer: Self-pay

## 2018-01-28 DIAGNOSIS — R7303 Prediabetes: Secondary | ICD-10-CM

## 2018-01-28 DIAGNOSIS — K5900 Constipation, unspecified: Secondary | ICD-10-CM

## 2018-01-28 DIAGNOSIS — J441 Chronic obstructive pulmonary disease with (acute) exacerbation: Secondary | ICD-10-CM

## 2018-01-28 DIAGNOSIS — I350 Nonrheumatic aortic (valve) stenosis: Secondary | ICD-10-CM | POA: Diagnosis not present

## 2018-01-28 DIAGNOSIS — E785 Hyperlipidemia, unspecified: Secondary | ICD-10-CM

## 2018-01-28 DIAGNOSIS — F0280 Dementia in other diseases classified elsewhere without behavioral disturbance: Secondary | ICD-10-CM | POA: Diagnosis not present

## 2018-01-28 DIAGNOSIS — Z720 Tobacco use: Secondary | ICD-10-CM | POA: Diagnosis not present

## 2018-01-28 DIAGNOSIS — I2510 Atherosclerotic heart disease of native coronary artery without angina pectoris: Secondary | ICD-10-CM

## 2018-01-28 DIAGNOSIS — I10 Essential (primary) hypertension: Secondary | ICD-10-CM | POA: Diagnosis not present

## 2018-01-28 DIAGNOSIS — G301 Alzheimer's disease with late onset: Secondary | ICD-10-CM | POA: Diagnosis not present

## 2018-01-28 DIAGNOSIS — R918 Other nonspecific abnormal finding of lung field: Secondary | ICD-10-CM | POA: Diagnosis not present

## 2018-01-28 LAB — MAGNESIUM: Magnesium: 2.1 mg/dL (ref 1.7–2.4)

## 2018-01-28 LAB — PHOSPHORUS: PHOSPHORUS: 3.1 mg/dL (ref 2.5–4.6)

## 2018-01-28 LAB — ECHOCARDIOGRAM COMPLETE
Height: 63 in
Weight: 2784 oz

## 2018-01-28 MED ORDER — IPRATROPIUM-ALBUTEROL 0.5-2.5 (3) MG/3ML IN SOLN
3.0000 mL | Freq: Four times a day (QID) | RESPIRATORY_TRACT | Status: DC
  Administered 2018-01-28 (×3): 3 mL via RESPIRATORY_TRACT
  Filled 2018-01-28 (×3): qty 3

## 2018-01-28 MED ORDER — METHYLPREDNISOLONE SODIUM SUCC 40 MG IJ SOLR: 20.0000 mg | Freq: Two times a day (BID) | INTRAMUSCULAR | Status: DC

## 2018-01-28 MED ORDER — MAGNESIUM SULFATE 2 GM/50ML IV SOLN
2.0000 g | Freq: Once | INTRAVENOUS | Status: AC
  Administered 2018-01-28: 2 g via INTRAVENOUS
  Filled 2018-01-28: qty 50

## 2018-01-28 MED ORDER — IPRATROPIUM-ALBUTEROL 0.5-2.5 (3) MG/3ML IN SOLN
3.0000 mL | Freq: Two times a day (BID) | RESPIRATORY_TRACT | Status: DC
  Administered 2018-01-28: 3 mL via RESPIRATORY_TRACT
  Filled 2018-01-28: qty 3

## 2018-01-28 MED ORDER — ONDANSETRON HCL 4 MG/2ML IJ SOLN: 4.0000 mg | Freq: Four times a day (QID) | INTRAMUSCULAR | Status: DC | PRN

## 2018-01-28 MED ORDER — ASPIRIN EC 81 MG PO TBEC
81.0000 mg | DELAYED_RELEASE_TABLET | Freq: Every day | ORAL | Status: DC
  Administered 2018-01-28 – 2018-01-29 (×2): 81 mg via ORAL
  Filled 2018-01-28 (×2): qty 1

## 2018-01-28 MED ORDER — ONDANSETRON HCL 4 MG PO TABS: 4.0000 mg | ORAL_TABLET | Freq: Four times a day (QID) | ORAL | Status: DC | PRN

## 2018-01-28 MED ORDER — POLYETHYLENE GLYCOL 3350 17 G PO PACK: 17.0000 g | PACK | Freq: Every day | ORAL | Status: DC | PRN

## 2018-01-28 MED ORDER — ENOXAPARIN SODIUM 40 MG/0.4ML ~~LOC~~ SOLN
40.0000 mg | SUBCUTANEOUS | Status: DC
  Administered 2018-01-28 (×2): 40 mg via SUBCUTANEOUS
  Filled 2018-01-28 (×2): qty 0.4

## 2018-01-28 MED ORDER — IPRATROPIUM-ALBUTEROL 0.5-2.5 (3) MG/3ML IN SOLN
3.0000 mL | Freq: Two times a day (BID) | RESPIRATORY_TRACT | Status: DC
  Administered 2018-01-29: 3 mL via RESPIRATORY_TRACT
  Filled 2018-01-28: qty 3

## 2018-01-28 MED ORDER — LEVOFLOXACIN IN D5W 500 MG/100ML IV SOLN
500.0000 mg | Freq: Once | INTRAVENOUS | Status: AC
  Administered 2018-01-28: 500 mg via INTRAVENOUS
  Filled 2018-01-28: qty 100

## 2018-01-28 MED ORDER — ALBUTEROL SULFATE (2.5 MG/3ML) 0.083% IN NEBU: 2.5000 mg | INHALATION_SOLUTION | RESPIRATORY_TRACT | Status: DC | PRN

## 2018-01-28 MED ORDER — LEVOFLOXACIN 500 MG PO TABS
500.0000 mg | ORAL_TABLET | Freq: Every day | ORAL | Status: DC
  Administered 2018-01-29: 500 mg via ORAL
  Filled 2018-01-28: qty 1

## 2018-01-28 MED ORDER — METOPROLOL SUCCINATE ER 25 MG PO TB24
25.0000 mg | ORAL_TABLET | Freq: Every day | ORAL | Status: DC
  Administered 2018-01-28 – 2018-01-29 (×2): 25 mg via ORAL
  Filled 2018-01-28 (×4): qty 1

## 2018-01-28 MED ORDER — METHYLPREDNISOLONE SODIUM SUCC 40 MG IJ SOLR: 40.0000 mg | Freq: Two times a day (BID) | INTRAMUSCULAR | Status: DC

## 2018-01-28 MED ORDER — VITAMIN D (ERGOCALCIFEROL) 1.25 MG (50000 UNIT) PO CAPS: 50000.0000 [IU] | ORAL_CAPSULE | ORAL | Status: DC

## 2018-01-28 MED ORDER — NICOTINE 14 MG/24HR TD PT24: 14.0000 mg | MEDICATED_PATCH | Freq: Every day | TRANSDERMAL | Status: DC | PRN

## 2018-01-28 NOTE — Progress Notes (Signed)
PROGRESS NOTE    SANDI TOWE  WUJ:[id number]  DOB: 06-24-1926  DOA: 01/27/2018 PCP: Kerri Perches, MD   Brief Admission Hx: [name] is a 84 y.o. female with medical history significant of colon polyps, CAD able native coronary artery, history of MI, dementia, depression, diastolic dysfunction, essential hypertension, hyperlipidemia, obesity who is coming to the emergency department due to shortness of breath since about 1800 today, associated with cough with clear sputum.  There has been no fever, no chills, rhinorrhea, sore throat, sick contacts or travel history.  She denies chest pain, palpitations, dizziness, diaphoresis, or PND.  She gets occasional mild lower extremity edema and occasional orthopnea.  MDM/Assessment & Plan:   1. COPD exacerbation - Mild - improving, continue current treatments, hopefully will be stable to discharge home tomorrow.   2. Heart murmur - with ongoing smoking abuse will check Echo for eval of aortic stenosis.  3. Prediabetes - stable, follow.  4. HLD - continue lifestyle modifications.  5. Dementia - reported as mild - supportive care and monitoring.  6. HTN - resume home medications and follow clinically.   7. Constipation - miralax ordered as needed.    DVT prophylaxis: lovenox  Code Status: Full  Family Communication:  [name] Disposition Plan: hopefully home tomorrow if continues to improve. PT eval pending.   Procedures: Echocardiogram 01/28/18 Study Conclusions  - Left ventricle: The cavity size was normal. Wall thickness was  increased in a pattern of moderate LVH. Systolic function was   normal. The estimated ejection fraction was in the range of 60% to 65%. Wall motion was normal; there were no regional wall   motion abnormalities. Doppler parameters are consistent with abnormal left ventricular relaxation (grade 1 diastolic  dysfunction). Doppler parameters are consistent with high  ventricular filling pressure. - Aortic  valve: Mildly to moderately calcified annulus. Trileaflet;  moderately thickened leaflets. There was mild to moderate   stenosis. Mean gradient (S): 21 mm Hg. Valve area (VTI): 1.52  cm^2. Valve area (Vmax): 1.53 cm^2. Valve area (Vmean): 1.1 cm^2. - Mitral valve: Mildly to moderately calcified annulus. Mildly  thickened leaflets . - Technically adequate study.  Subjective: Awake, alert, feeling a little better. Waiting for a room, no chest pain and no SOB.    Objective: Vitals:   01/28/18 1348 01/28/18 1349 01/28/18 1449 01/28/18 1537  BP:   (!) 150/63   Pulse: 82 (!) 54 (!) 120   Resp: 12 (!) 21    Temp:   99.1 F (37.3 C)   TempSrc:   Oral   SpO2: 100% 100% 100% 99%  Weight:   78 kg (171 lb 15.3 oz)   Height:   5\' 3"  (1.6 m)     Intake/Output Summary (Last 24 hours) at 01/28/2018 1602 Last data filed at 01/28/2018 0324 Gross per 24 hour  Intake 150 ml  Output -  Net 150 ml   Filed Weights   01/27/18 1844 01/28/18 1449  Weight: 78.9 kg (174 lb) 78 kg (171 lb 15.3 oz)     REVIEW OF SYSTEMS  As per history otherwise all reviewed and reported negative  Exam:  General exam: elderly female awake, alert, NAD cooperative.  Respiratory system: fairly clear with faint wheezing heard. No increased work of breathing. Cardiovascular system: S1 & S2 heard, RRR. 3/5 holosystolic murmur harsh sounding.  Gastrointestinal system: Abdomen is nondistended, soft and nontender. Normal bowel sounds heard. Central nervous system: Alert and oriented. No focal neurological deficits. Extremities:  no CCE.  Data Reviewed: Basic Metabolic Panel: Recent Labs  Lab 01/27/18 2035  NA 139  K 4.1  CL 102  CO2 26  GLUCOSE 88  BUN 17  CREATININE 0.63  CALCIUM 9.5  MG 2.1  PHOS 3.1   Liver Function Tests: Recent Labs  Lab 01/27/18 2035  AST 19  ALT 14  ALKPHOS 79  BILITOT 0.8  PROT 7.2  ALBUMIN 3.4*   No results for input(s): LIPASE, AMYLASE in the last 168 hours. No results for  input(s): AMMONIA in the last 168 hours. CBC: Recent Labs  Lab 01/27/18 2035  WBC 8.1  NEUTROABS 4.5  HGB 12.4  HCT 40.1  MCV 85.7  PLT 237   Cardiac Enzymes: Recent Labs  Lab 01/27/18 2035  TROPONINI <0.03   CBG (last 3)  Recent Labs    01/27/18 1850  GLUCAP 78   No results found for this or any previous visit (from the past 240 hour(s)).   Studies: Ct Chest Wo Contrast  Result Date: 01/28/2018 CLINICAL DATA:  84 y/o F; cough and congestion. Abnormal chest radiograph. EXAM: CT CHEST WITHOUT CONTRAST TECHNIQUE: Multidetector CT imaging of the chest was performed following the standard protocol without IV contrast. COMPARISON:  01/27/2018 chest radiograph. FINDINGS: Cardiovascular: Normal heart size. Severe coronary artery calcification and aortic calcific atherosclerosis. Normal caliber thoracic aorta and main pulmonary artery. No pericardial effusion. Mediastinum/Nodes: No enlarged mediastinal or axillary lymph nodes. Thyroid gland, trachea, and esophagus demonstrate no significant findings. Lungs/Pleura: Peribronchial thickening and patchy ground-glass opacities in bronchovascular distribution. Smooth interlobular septal thickening. No pleural effusion or pneumothorax. Upper Abdomen: No acute abnormality. Musculoskeletal: No fracture is seen. IMPRESSION: 1. Interlobular septal thickening, patchy ground-glass opacities, and peribronchial thickening. Findings probably represent interstitial and alveolar pulmonary edema. Atypical pneumonia is possible. No consolidation. 2. Severe coronary artery and aortic calcific atherosclerosis. Electronically Signed   By: Mitzi Hansen M.D.   On: 01/28/2018 00:37   Dg Chest Portable 1 View  Result Date: 01/27/2018 CLINICAL DATA:  84 year old female with shortness of breath and chest pain today. Difficult to arouse. EXAM: PORTABLE CHEST 1 VIEW COMPARISON:  01/30/2012 and earlier. FINDINGS: Portable AP upright view at 1945 hours. Low lung  volumes with combined bilateral interstitial and patchy perihilar airspace opacity. No pneumothorax. No large pleural effusion. Chronic elevation of the right hemidiaphragm is stable since 2012. Calcified aortic atherosclerosis. Partially obscured mediastinal contours. Visualized tracheal air column is within normal limits. Paucity of bowel gas in the upper abdomen. Advanced degenerative changes at both shoulders. No acute osseous abnormality identified. IMPRESSION: 1. Combined bilateral pulmonary interstitial and airspace opacity is nonspecific. Consider acute pulmonary edema, bilateral pneumonia, and developing ARDS. 2. No pneumothorax or large pleural effusion. Chronic elevation of the right hemidiaphragm. 3.  Aortic Atherosclerosis (ICD10-I70.0). Electronically Signed   By: Odessa Fleming M.D.   On: 01/27/2018 20:19     Scheduled Meds: . aspirin EC  81 mg Oral Daily  . enoxaparin (LOVENOX) injection  40 mg Subcutaneous Q24H  . ipratropium-albuterol  3 mL Nebulization Q6H  . [START ON 01/29/2018] levofloxacin  500 mg Oral Daily  . [START ON 01/29/2018] methylPREDNISolone (SOLU-MEDROL) injection  40 mg Intravenous Q12H  . metoprolol succinate  25 mg Oral Daily  . [START ON 02/02/2018] Vitamin D (Ergocalciferol)  50,000 Units Oral Weekly   Continuous Infusions:  Principal Problem:   COPD exacerbation (HCC) Active Problems:   Prediabetes   Hyperlipidemia LDL goal <100   Dementia   Essential  hypertension, benign   Coronary atherosclerosis of native coronary artery   Constipation   Tobacco use  Time spent:   Standley Dakinslanford Johnson, MD, FAAFP [facility] Pager [phone]  If 7PM-7AM, please contact night-coverage [email or web address] Password [email or web address] 01/28/2018, 4:02 PM    LOS: 0 days

## 2018-01-28 NOTE — ED Notes (Signed)
Pt alert and oriented.  Dry barky cough intermittently.  Pt's room air o2 sat 89% while sleeping.  Placed pt on 02 at 2liters and sat increased to 98%.  Pt without complaints at this time.

## 2018-01-28 NOTE — Progress Notes (Signed)
*  PRELIMINARY RESULTS* Echocardiogram 2D Echocardiogram has been performed.  [name], [name] 01/28/2018, 12:06 PM

## 2018-01-28 NOTE — Care Management Obs Status (Signed)
MEDICARE OBSERVATION STATUS NOTIFICATION   Patient Details  Name: [name] MRN: [id number] Date of Birth: 04/19/1926   Medicare Observation Status Notification Given:  Yes    Aleesia Henney, Chrystine OilerSharley Diane, RN 01/28/2018, 2:02 PM

## 2018-01-29 DIAGNOSIS — G301 Alzheimer's disease with late onset: Secondary | ICD-10-CM | POA: Diagnosis not present

## 2018-01-29 DIAGNOSIS — I2510 Atherosclerotic heart disease of native coronary artery without angina pectoris: Secondary | ICD-10-CM | POA: Diagnosis not present

## 2018-01-29 DIAGNOSIS — K5900 Constipation, unspecified: Secondary | ICD-10-CM | POA: Diagnosis not present

## 2018-01-29 DIAGNOSIS — I10 Essential (primary) hypertension: Secondary | ICD-10-CM | POA: Diagnosis not present

## 2018-01-29 DIAGNOSIS — J441 Chronic obstructive pulmonary disease with (acute) exacerbation: Secondary | ICD-10-CM | POA: Diagnosis not present

## 2018-01-29 DIAGNOSIS — I350 Nonrheumatic aortic (valve) stenosis: Secondary | ICD-10-CM

## 2018-01-29 LAB — CBC
HCT: 39.2 % (ref 36.0–46.0)
Hemoglobin: 12 g/dL (ref 12.0–15.0)
MCH: 26.3 pg (ref 26.0–34.0)
MCHC: 30.6 g/dL (ref 30.0–36.0)
MCV: 86 fL (ref 78.0–100.0)
PLATELETS: 261 10*3/uL (ref 150–400)
RBC: 4.56 MIL/uL (ref 3.87–5.11)
RDW: 15.3 % (ref 11.5–15.5)
WBC: 11.9 10*3/uL — AB (ref 4.0–10.5)

## 2018-01-29 LAB — GLUCOSE, CAPILLARY: Glucose-Capillary: 104 mg/dL — ABNORMAL HIGH (ref 65–99)

## 2018-01-29 LAB — BASIC METABOLIC PANEL
ANION GAP: 12 (ref 5–15)
BUN: 18 mg/dL (ref 6–20)
CALCIUM: 8.9 mg/dL (ref 8.9–10.3)
CO2: 26 mmol/L (ref 22–32)
Chloride: 104 mmol/L (ref 101–111)
Creatinine, Ser: 0.71 mg/dL (ref 0.44–1.00)
GFR calc Af Amer: >60 mL/min (ref >60)
GLUCOSE: 104 mg/dL — AB (ref 65–99)
Potassium: 3.8 mmol/L (ref 3.5–5.1)
Sodium: 142 mmol/L (ref 135–145)

## 2018-01-29 MED ORDER — NICOTINE 14 MG/24HR TD PT24: 14.0000 mg | MEDICATED_PATCH | Freq: Every day | TRANSDERMAL | 0 refills | Status: DC | PRN

## 2018-01-29 MED ORDER — LEVOFLOXACIN 500 MG PO TABS: 500.0000 mg | ORAL_TABLET | Freq: Every day | ORAL | 0 refills | Status: AC

## 2018-01-29 NOTE — Evaluation (Signed)
Physical Therapy Evaluation Patient Details Name: [name] MRN: [id number] DOB: Jul 15, 1926 Today's Date: 01/29/2018   History of Present Illness  [name] is a [age] y.o. female with medical history significant of colon polyps, CAD able native coronary artery, history of MI, dementia, depression, diastolic dysfunction, essential hypertension, hyperlipidemia, obesity who is coming to the emergency department due to shortness of breath since about 1800 today, associated with cough with clear sputum.  There has been no fever, no chills, rhinorrhea, sore throat, sick contacts or travel history.  She denies chest pain, palpitations, dizziness, diaphoresis, or PND.  She gets occasional mild lower extremity edema and occasional orthopnea.  Denies abdominal pain, nausea, emesis, diarrhea, melena or hematochezia.  She gets frequently constipated and uses MiraLAX.  No dysuria, frequency or hematuria.  Denies polyphagia, polydipsia, polyuria or blurred vision.  Denies skin rashes or pruritus.    Clinical Impression  Patient functioning near baseline for functional mobility and gait, other than demonstrating slow labored movement for getting into/out of bed and states her bed is lower at home and easier.  Plan: patient to bed discharged home today and discharged from physical therapy to care of nursing     Follow Up Recommendations No PT follow up    Equipment Recommendations  None recommended by PT    Recommendations for Other Services       Precautions / Restrictions Precautions Precautions: None Restrictions Weight Bearing Restrictions: No      Mobility  Bed Mobility Overal bed mobility: Needs Assistance Bed Mobility: Supine to Sit;Sit to Supine     Supine to sit: Supervision Sit to supine: Min guard      Transfers Overall transfer level: Modified independent Equipment used: Rolling walker (2 wheeled)                Ambulation/Gait Ambulation/Gait assistance:  Modified independent (Device/Increase time) Ambulation Distance (Feet): 60 Feet Assistive device: Rolling walker (2 wheeled) Gait Pattern/deviations: Decreased step length - right;Decreased step length - left;Decreased stride length   Gait velocity interpretation: Below normal speed for age/gender General Gait Details: shuffles feet during initial gait, then ambulates normally, no loss of balance, limited secondary to mild SOB  Stairs            Wheelchair Mobility    Modified Rankin (Stroke Patients Only)       Balance Overall balance assessment: Mild deficits observed, not formally tested                                           Pertinent Vitals/Pain Pain Assessment: No/denies pain    Home Living Family/patient expects to be discharged to:: Private residence Living Arrangements: Alone Available Help at Discharge: Personal care attendant;Family;Available PRN/intermittently Type of Home: House Home Access: Level entry     Home Layout: One level Home Equipment: Walker - 2 wheels;Bedside commode;Shower seat      Prior Function Level of Independence: Needs assistance   Gait / Transfers Assistance Needed: Ambulates household distances with RW  ADL's / Homemaking Assistance Needed: has caregivers 2 hours/day x 3 days/week        Hand Dominance        Extremity/Trunk Assessment   Upper Extremity Assessment Upper Extremity Assessment: Overall WFL for tasks assessed    Lower Extremity Assessment Lower Extremity Assessment: Overall WFL for tasks assessed    Cervical / Trunk  Assessment Cervical / Trunk Assessment: Normal  Communication   Communication: No difficulties  Cognition Arousal/Alertness: Awake/alert Behavior During Therapy: WFL for tasks assessed/performed Overall Cognitive Status: Within Functional Limits for tasks assessed                                        General Comments      Exercises      Assessment/Plan    PT Assessment Patent does not need any further PT services  PT Problem List         PT Treatment Interventions      PT Goals (Current goals can be found in the Care Plan section)  Acute Rehab PT Goals Patient Stated Goal: return home  PT Goal Formulation: With patient/family Time For Goal Achievement: February 15, 2018 Potential to Achieve Goals: Good    Frequency     Barriers to discharge        Co-evaluation               AM-PAC PT "6 Clicks" Daily Activity  Outcome Measure Difficulty turning over in bed (including adjusting bedclothes, sheets and blankets)?: None Difficulty moving from lying on back to sitting on the side of the bed? : A Little Difficulty sitting down on and standing up from a chair with arms (e.g., wheelchair, bedside commode, etc,.)?: None Help needed moving to and from a bed to chair (including a wheelchair)?: None Help needed walking in hospital room?: None Help needed climbing 3-5 steps with a railing? : A Little 6 Click Score: 22    End of Session   Activity Tolerance: Patient tolerated treatment well;Patient limited by fatigue Patient left: in bed;with call bell/phone within reach;with family/visitor present;with nursing/sitter in room(seated at bedside) Nurse Communication: Mobility status PT Visit Diagnosis: Unsteadiness on feet (R26.81);Other abnormalities of gait and mobility (R26.89);Muscle weakness (generalized) (M62.81)    Time: 1610-9604 PT Time Calculation (min) (ACUTE ONLY): 18 min   Charges:   PT Evaluation $PT Eval Low Complexity: 1 Low PT Treatments $Therapeutic Activity: 8-22 mins   PT G Codes:        10:26 AM, 02-15-2018 Ocie Bob, MPT Physical Therapist with [facility] [phone] office [phone] mobile phone

## 2018-01-29 NOTE — Discharge Instructions (Signed)
Follow with Primary MD  Kerri Perches, MD  and other consultants as instructed your Hospitalist MD  Please get a complete blood count and chemistry panel checked by your Primary MD at your next visit, and again as instructed by your Primary MD.  Get Medicines reviewed and adjusted: Please take all your medications with you for your next visit with your Primary MD  Laboratory/radiological data: Please request your Primary MD to go over all hospital tests and procedure/radiological results at the follow up, please ask your Primary MD to get all Hospital records sent to his/her office.  In some cases, they will be blood work, cultures and biopsy results pending at the time of your discharge. Please request that your primary care M.D. follows up on these results.  Also Note the following: If you experience worsening of your admission symptoms, develop shortness of breath, life threatening emergency, suicidal or homicidal thoughts you must seek medical attention immediately by calling 911 or calling your MD immediately  if symptoms less severe.  You must read complete instructions/literature along with all the possible adverse reactions/side effects for all the Medicines you take and that have been prescribed to you. Take any new Medicines after you have completely understood and accpet all the possible adverse reactions/side effects.   Do not drive when taking Pain medications or sleeping medications (Benzodaizepines)  Do not take more than prescribed Pain, Sleep and Anxiety Medications. It is not advisable to combine anxiety,sleep and pain medications without talking with your primary care practitioner  Special Instructions: If you have smoked or chewed Tobacco  in the last 2 yrs please stop smoking, stop any regular Alcohol  and or any Recreational drug use.  Wear Seat belts while driving.  Please note: You were cared for by a hospitalist during your hospital stay. Once you are  discharged, your primary care physician will handle any further medical issues. Please note that NO REFILLS for any discharge medications will be authorized once you are discharged, as it is imperative that you return to your primary care physician (or establish a relationship with a primary care physician if you do not have one) for your post hospital discharge needs so that they can reassess your need for medications and monitor your lab values.    Shortness of Breath, Adult Shortness of breath means you have trouble breathing. Your lungs are organs for breathing. Follow these instructions at home: Pay attention to any changes in your symptoms. Take these actions to help with your condition:  Do not smoke. Smoking can cause shortness of breath. If you need help to quit smoking, ask your doctor.  Avoid things that can make it harder to breathe, such as: ? Mold. ? Dust. ? Air pollution. ? Chemical smells. ? Things that can cause allergy symptoms (allergens), if you have allergies.  Keep your living space clean and free of mold and dust.  Rest as needed. Slowly return to your usual activities.  Take over-the-counter and prescription medicines, including oxygen and inhaled medicines, only as told by your doctor.  Keep all follow-up visits as told by your doctor. This is important.  Contact a doctor if:  Your condition does not get better as soon as expected.  You have a hard time doing your normal activities, even after you rest.  You have new symptoms. Get help right away if:  You have trouble breathing when you are resting.  You feel light-headed or you faint.  You have a cough that  is not helped by medicines.  You cough up blood.  You have pain with breathing.  You have pain in your chest, arms, shoulders, or belly (abdomen).  You have a fever.  You cannot walk up stairs.  You cannot exercise the way you normally do. This information is not intended to replace  advice given to you by your health care provider. Make sure you discuss any questions you have with your health care provider. Document Released: 04/08/2008 Document Revised: 11/07/2016 Document Reviewed: 11/07/2016 Elsevier Interactive Patient Education  2017 [employer]

## 2018-01-29 NOTE — Discharge Summary (Signed)
Physician Discharge Summary  [name]:[id number] DOB: May 05, 1926 DOA: 01/27/2018  PCP: Kerri Perches, MD  Admit date: 01/27/2018 Discharge date: 01/29/2018  Admitted From: Home  Disposition: Home   Recommendations for Outpatient Follow-up:  1. Follow up with PCP in 1 week 2. Follow up with cardiology in 2 weeks 3. Please obtain BMP/CBC in one week  Discharge Condition: STABLE   CODE STATUS: FULL    Brief Hospitalization Summary: Please see all hospital notes, images, labs for full details of the hospitalization.  Brief Admission Hx: [name] a 84 y.o.femalewith medical history significant of colon polyps, CAD able native coronary artery, history of MI, dementia, depression, diastolic dysfunction, essential hypertension, hyperlipidemia, obesity who is coming to the emergency department due to shortness of breath since about 1800 today, associated with cough with clear sputum. There has been no fever, no chills, rhinorrhea, sore throat, sick contacts or travel history. She denies chest pain, palpitations, dizziness, diaphoresis, or PND. She gets occasional mild lower extremity edema and occasional orthopnea.  MDM/Assessment & Plan:   1. COPD exacerbation - Mild - improved and no further wheezing, cough or SOB.   2. Mild to Moderate AS per Echo with Heart murmur - with ongoing smoking abuse will check Echo for eval of aortic stenosis. Aortic valve: Mildly to moderately calcified annulus. Trileaflet;moderately thickened leaflets. There was mild to moderatestenosis.  Follow up with cardiology outpatient.  3. Prediabetes - stable, follow.  4. HLD - continue lifestyle modifications.  5. Dementia - reported as mild - supportive care and monitoring.  6. HTN - resume home medications and follow clinically.   7. Constipation - miralax ordered as needed.    DVT prophylaxis: lovenox  Code Status: Full  Family Communication:  [name] Disposition Plan: Home     Procedures: Echocardiogram 01/28/18 Study Conclusions  - Left ventricle: The cavity size was normal. Wall thickness wasincreased in a pattern of moderate LVH. Systolic function was normal. The estimated ejection fraction was in the range of 60%to 65%. Wall motion was normal; there were no regional wall motion abnormalities. Doppler parameters are consistent withabnormal left ventricular relaxation (grade 1 diastolicdysfunction). Doppler parameters are consistent with highventricular filling pressure. - Aortic valve: Mildly to moderately calcified annulus. Trileaflet;moderately thickened leaflets. There was mild to moderatestenosis. Mean gradient (S): 21 mm Hg. Valve area (VTI): 1.52cm^2. Valve area (Vmax): 1.53 cm^2. Valve area (Vmean): 1.1 cm^2. - Mitral valve: Mildly to moderately calcified annulus. Mildlythickened leaflets . - Technically adequate study.   Discharge Diagnoses:  Principal Problem:   COPD exacerbation (HCC) Active Problems:   Prediabetes   Hyperlipidemia LDL goal <100   Dementia   Essential hypertension, benign   Coronary atherosclerosis of native coronary artery   Constipation   Tobacco use  Discharge Instructions: Discharge Instructions    Call MD for:  difficulty breathing, headache or visual disturbances   Complete by:  As directed    Call MD for:  extreme fatigue   Complete by:  As directed    Call MD for:  persistant dizziness or light-headedness   Complete by:  As directed      Allergies as of 01/29/2018   No Known Allergies     Medication List    TAKE these medications   aspirin EC 81 MG tablet Take 1 tablet (81 mg total) by mouth daily.   levofloxacin 500 MG tablet Commonly known as:  LEVAQUIN Take 1 tablet (500 mg total) by mouth daily for 3 days. Start  taking on:  01/30/2018   metoprolol succinate 25 MG 24 hr tablet Commonly known as:  TOPROL-XL TAKE 1 TABLET BY MOUTH ONCE DAILY.   nicotine 14 mg/24hr  patch Commonly known as:  NICODERM CQ - dosed in mg/24 hours Place 1 patch (14 mg total) onto the skin daily as needed (For nicotine withdrawal symptoms`).   polyethylene glycol packet Commonly known as:  MIRALAX Take 17 g by mouth daily. What changed:    when to take this  reasons to take this   Vitamin D (Ergocalciferol) 50000 units Caps capsule Commonly known as:  DRISDOL TAKE 1 CAPSULE BY MOUTH ONCE WEEKLY.      Follow-up Information    Kerri Perches, MD. Schedule an appointment as soon as possible for a visit in 1 week(s).   Specialty:  Family Medicine Contact information: [address], [address] [place] [address] [phone]        Jonelle Sidle, MD. Schedule an appointment as soon as possible for a visit in 2 week(s).   Specialty:  Cardiology Why:  follow up aortic stenosis  Contact information: [address] [place] [address] [phone]          No Known Allergies Allergies as of 01/29/2018   No Known Allergies     Medication List    TAKE these medications   aspirin EC 81 MG tablet Take 1 tablet (81 mg total) by mouth daily.   levofloxacin 500 MG tablet Commonly known as:  LEVAQUIN Take 1 tablet (500 mg total) by mouth daily for 3 days. Start taking on:  01/30/2018   metoprolol succinate 25 MG 24 hr tablet Commonly known as:  TOPROL-XL TAKE 1 TABLET BY MOUTH ONCE DAILY.   nicotine 14 mg/24hr patch Commonly known as:  NICODERM CQ - dosed in mg/24 hours Place 1 patch (14 mg total) onto the skin daily as needed (For nicotine withdrawal symptoms`).   polyethylene glycol packet Commonly known as:  MIRALAX Take 17 g by mouth daily. What changed:    when to take this  reasons to take this   Vitamin D (Ergocalciferol) 50000 units Caps capsule Commonly known as:  DRISDOL TAKE 1 CAPSULE BY MOUTH ONCE WEEKLY.       Procedures/Studies: Ct Chest Wo Contrast  Result Date: 01/28/2018 CLINICAL DATA:  84 y/o F; cough  and congestion. Abnormal chest radiograph. EXAM: CT CHEST WITHOUT CONTRAST TECHNIQUE: Multidetector CT imaging of the chest was performed following the standard protocol without IV contrast. COMPARISON:  01/27/2018 chest radiograph. FINDINGS: Cardiovascular: Normal heart size. Severe coronary artery calcification and aortic calcific atherosclerosis. Normal caliber thoracic aorta and main pulmonary artery. No pericardial effusion. Mediastinum/Nodes: No enlarged mediastinal or axillary lymph nodes. Thyroid gland, trachea, and esophagus demonstrate no significant findings. Lungs/Pleura: Peribronchial thickening and patchy ground-glass opacities in bronchovascular distribution. Smooth interlobular septal thickening. No pleural effusion or pneumothorax. Upper Abdomen: No acute abnormality. Musculoskeletal: No fracture is seen. IMPRESSION: 1. Interlobular septal thickening, patchy ground-glass opacities, and peribronchial thickening. Findings probably represent interstitial and alveolar pulmonary edema. Atypical pneumonia is possible. No consolidation. 2. Severe coronary artery and aortic calcific atherosclerosis. Electronically Signed   By: Mitzi Hansen M.D.   On: 01/28/2018 00:37   Dg Chest Portable 1 View  Result Date: 01/27/2018 CLINICAL DATA:  84 year old female with shortness of breath and chest pain today. Difficult to arouse. EXAM: PORTABLE CHEST 1 VIEW COMPARISON:  01/30/2012 and earlier. FINDINGS: Portable AP upright view at 1945 hours. Low  lung volumes with combined bilateral interstitial and patchy perihilar airspace opacity. No pneumothorax. No large pleural effusion. Chronic elevation of the right hemidiaphragm is stable since 2012. Calcified aortic atherosclerosis. Partially obscured mediastinal contours. Visualized tracheal air column is within normal limits. Paucity of bowel gas in the upper abdomen. Advanced degenerative changes at both shoulders. No acute osseous abnormality identified.  IMPRESSION: 1. Combined bilateral pulmonary interstitial and airspace opacity is nonspecific. Consider acute pulmonary edema, bilateral pneumonia, and developing ARDS. 2. No pneumothorax or large pleural effusion. Chronic elevation of the right hemidiaphragm. 3.  Aortic Atherosclerosis (ICD10-I70.0). Electronically Signed   By: Odessa Fleming M.D.   On: 01/27/2018 20:19      Subjective: Pt says that her breathing is better and she is feeling a lot better today.   Discharge Exam: Vitals:   01/29/18 0507 01/29/18 0745  BP: (!) 177/92   Pulse: 92   Resp:    Temp: 98.1 F (36.7 C)   SpO2: 94% 100%   Vitals:   01/28/18 2016 01/28/18 2059 01/29/18 0507 01/29/18 0745  BP:  (!) 161/55 (!) 177/92   Pulse:  81 92   Resp:      Temp:  98.6 F (37 C) 98.1 F (36.7 C)   TempSrc:  Oral Oral   SpO2: 99% 100% 94% 100%  Weight:   78 kg (171 lb 15.3 oz)   Height:       General: Pt is alert, awake, not in acute distress Cardiovascular: RRR, S1/S2 +, no rubs, no gallops Respiratory: CTA bilaterally, no wheezing, no rhonchi Abdominal: Soft, NT, ND, bowel sounds + Extremities: no edema, no cyanosis   The results of significant diagnostics from this hospitalization (including imaging, microbiology, ancillary and laboratory) are listed below for reference.     Microbiology: No results found for this or any previous visit (from the past 240 hour(s)).   Labs: BNP (last 3 results) Recent Labs    01/27/18 2302  BNP 95.0   Basic Metabolic Panel: Recent Labs  Lab 01/27/18 2035  NA 139  K 4.1  CL 102  CO2 26  GLUCOSE 88  BUN 17  CREATININE 0.63  CALCIUM 9.5  MG 2.1  PHOS 3.1   Liver Function Tests: Recent Labs  Lab 01/27/18 2035  AST 19  ALT 14  ALKPHOS 79  BILITOT 0.8  PROT 7.2  ALBUMIN 3.4*   No results for input(s): LIPASE, AMYLASE in the last 168 hours. No results for input(s): AMMONIA in the last 168 hours. CBC: Recent Labs  Lab 01/27/18 2035 01/29/18 0801  WBC 8.1  11.9*  NEUTROABS 4.5  --   HGB 12.4 12.0  HCT 40.1 39.2  MCV 85.7 86.0  PLT 237 261   Cardiac Enzymes: Recent Labs  Lab 01/27/18 2035  TROPONINI <0.03   BNP: Invalid input(s): POCBNP CBG: Recent Labs  Lab 01/27/18 1850 01/29/18 0814  GLUCAP 78 104*   D-Dimer No results for input(s): DDIMER in the last 72 hours. Hgb A1c No results for input(s): HGBA1C in the last 72 hours. Lipid Profile No results for input(s): CHOL, HDL, LDLCALC, TRIG, CHOLHDL, LDLDIRECT in the last 72 hours. Thyroid function studies No results for input(s): TSH, T4TOTAL, T3FREE, THYROIDAB in the last 72 hours.  Invalid input(s): FREET3 Anemia work up No results for input(s): VITAMINB12, FOLATE, FERRITIN, TIBC, IRON, RETICCTPCT in the last 72 hours. Urinalysis    Component Value Date/Time   COLORURINE STRAW (A) 01/27/2018 1922   APPEARANCEUR CLEAR 01/27/2018 1922  LABSPEC 1.006 01/27/2018 1922   PHURINE 9.0 (H) 01/27/2018 1922   GLUCOSEU NEGATIVE 01/27/2018 1922   HGBUR NEGATIVE 01/27/2018 1922   HGBUR trace-lysed 01/08/2011 1122   BILIRUBINUR NEGATIVE 01/27/2018 1922   KETONESUR NEGATIVE 01/27/2018 1922   PROTEINUR NEGATIVE 01/27/2018 1922   UROBILINOGEN 1.0 01/08/2011 1122   NITRITE NEGATIVE 01/27/2018 1922   LEUKOCYTESUR NEGATIVE 01/27/2018 1922   Sepsis Labs Invalid input(s): PROCALCITONIN,  WBC,  LACTICIDVEN Microbiology No results found for this or any previous visit (from the past 240 hour(s)).  Time coordinating discharge:   SIGNED:  Standley Dakinslanford Johnson, MD  [facility] 01/29/2018, 9:11 AM Pager [phone]  If 7PM-7AM, please contact night-coverage [email or web address] Password [email or web address]

## 2018-01-30 NOTE — ED Provider Notes (Signed)
[facility] MEDICAL SURGICAL UNIT Provider Note   CSN: [id number] Arrival date & time: 01/27/18  1841     History   Chief Complaint Chief Complaint  Patient presents with  . Shortness of Breath    HPI [name] is a 84 y.o. female.  Patient presents with shortness of breath.  He has also had a cough.  The history is provided by the patient. No language interpreter was used.  Shortness of Breath  This is a new problem. The problem occurs continuously.The current episode started 2 days ago. The problem has not changed since onset.Pertinent negatives include no fever, no headaches, no cough, no chest pain, no abdominal pain and no rash. It is unknown what precipitated the problem. Risk factors: COPD. She has tried nothing for the symptoms. She has had prior ED visits. Associated medical issues do not include PE.    Past Medical History:  Diagnosis Date  . Colon polyps   . Coronary atherosclerosis of native coronary artery October 2005.   Cardiac cath revealed 70% circumflex stenosis. Treated medically. Ejection fraction 55-65%.  . Dementia   . Depression    no symptoms expressed in 2014  . Diastolic dysfunction 02/2012  . Essential hypertension, benign   . Hyperlipidemia    diet controlled, med compliance a prob  . Myocardial infarction [facility]) October 2005  . Obesity     Patient Active Problem List   Diagnosis Date Noted  . COPD exacerbation (HCC) 01/27/2018  . Constipation 01/27/2018  . Tobacco use 01/27/2018  . At high risk for falls 04/01/2016  . Patient had 2 or more falls in past year 03/25/2016  . Onychomycosis 08/23/2015  . Need for prophylactic vaccination and inoculation against influenza 09/15/2014  . Diastolic dysfunction 09/18/2013  . Annual physical exam 09/18/2013  . Nicotine dependence 03/22/2013  . Chest pain 01/30/2012  . Pulmonary vascular congestion 01/30/2012  . Coronary atherosclerosis of native coronary artery 01/30/2012  . CARPAL TUNNEL  SYNDROME, BILATERAL 09/19/2010  . Dementia 08/10/2010  . Vitamin D deficiency 04/25/2010  . FATIGUE 04/25/2010  . Prediabetes 02/10/2008  . Hyperlipidemia LDL goal <100 02/10/2008  . OBESITY 02/10/2008  . Essential hypertension, benign 02/10/2008    Past Surgical History:  Procedure Laterality Date  . ABDOMINAL HYSTERECTOMY    . APPENDECTOMY     Age 30  . VESICOVAGINAL FISTULA CLOSURE W/ TAH       OB History   None      Home Medications    Prior to Admission medications   Medication Sig Start Date End Date Taking? Authorizing Provider  aspirin EC 81 MG tablet Take 1 tablet (81 mg total) by mouth daily. 09/24/13  Yes Kerri Perches, MD  metoprolol succinate (TOPROL-XL) 25 MG 24 hr tablet TAKE 1 TABLET BY MOUTH ONCE DAILY. 10/22/17  Yes Kerri Perches, MD  polyethylene glycol [facility]) packet Take 17 g by mouth daily. Patient taking differently: Take 17 g by mouth daily as needed.  09/24/16  Yes Vanetta Mulders, MD  Vitamin D, Ergocalciferol, (DRISDOL) 50000 units CAPS capsule TAKE 1 CAPSULE BY MOUTH ONCE WEEKLY. 12/11/17  Yes Kerri Perches, MD  levofloxacin (LEVAQUIN) 500 MG tablet Take 1 tablet (500 mg total) by mouth daily for 3 days. 01/30/18 02/02/18  Johnson, Clanford L, MD  nicotine (NICODERM CQ - DOSED IN MG/24 HOURS) 14 mg/24hr patch Place 1 patch (14 mg total) onto the skin daily as needed (For nicotine withdrawal symptoms`). 01/29/18   Laural Benes,  Clanford L, MD    Family History Family History  Problem Relation Age of Onset  . Heart attack Mother   . Cancer Mother        Breast   . Alcohol abuse Father   . Heart attack Sister   . Diabetes Sister     Social History Social History   Tobacco Use  . Smoking status: Current Some Day Smoker    Packs/day: 0.25    Types: Cigarettes  . Smokeless tobacco: Never Used  . Tobacco comment: 1 or 2 cigs a day  Substance Use Topics  . Alcohol use: No    Alcohol/week: 0.0 oz  . Drug use: No      Allergies   Patient has no known allergies.   Review of Systems Review of Systems  Constitutional: Negative for appetite change, fatigue and fever.  HENT: Negative for congestion, ear discharge and sinus pressure.   Eyes: Negative for discharge.  Respiratory: Positive for shortness of breath. Negative for cough.   Cardiovascular: Negative for chest pain.  Gastrointestinal: Negative for abdominal pain and diarrhea.  Genitourinary: Negative for frequency and hematuria.  Musculoskeletal: Negative for back pain.  Skin: Negative for rash.  Neurological: Negative for seizures and headaches.  Psychiatric/Behavioral: Negative for hallucinations.     Physical Exam Updated Vital Signs BP (!) 177/92 (BP Location: Left Arm)   Pulse 92   Temp 98.1 F (36.7 C) (Oral)   Resp 18   Ht 5\' 3"  (1.6 m)   Wt 78 kg (171 lb 15.3 oz)   SpO2 100%   BMI 30.46 kg/m   Physical Exam  Constitutional: She is oriented to person, place, and time. She appears well-developed.  HENT:  Head: Normocephalic.  Eyes: Conjunctivae and EOM are normal. No scleral icterus.  Neck: Neck supple. No thyromegaly present.  Cardiovascular: Normal rate and regular rhythm. Exam reveals no gallop and no friction rub.  No murmur heard. Pulmonary/Chest: No stridor. She has wheezes. She has no rales. She exhibits no tenderness.  Abdominal: She exhibits no distension. There is no tenderness. There is no rebound.  Musculoskeletal: Normal range of motion. She exhibits no edema.  Lymphadenopathy:    She has no cervical adenopathy.  Neurological: She is oriented to person, place, and time. She exhibits normal muscle tone. Coordination normal.  Skin: No rash noted. No erythema.  Psychiatric: She has a normal mood and affect. Her behavior is normal.     ED Treatments / Results  Labs (all labs ordered are listed, but only abnormal results are displayed) Labs Reviewed  COMPREHENSIVE METABOLIC PANEL - Abnormal; Notable  for the following components:      Result Value   Albumin 3.4 (*)    All other components within normal limits  URINALYSIS, ROUTINE W REFLEX MICROSCOPIC - Abnormal; Notable for the following components:   Color, Urine STRAW (*)    pH 9.0 (*)    All other components within normal limits  CBC - Abnormal; Notable for the following components:   WBC 11.9 (*)    All other components within normal limits  BASIC METABOLIC PANEL - Abnormal; Notable for the following components:   Glucose, Bld 104 (*)    All other components within normal limits  GLUCOSE, CAPILLARY - Abnormal; Notable for the following components:   Glucose-Capillary 104 (*)    All other components within normal limits  CBC WITH DIFFERENTIAL/PLATELET  TROPONIN I  BRAIN NATRIURETIC PEPTIDE  MAGNESIUM  PHOSPHORUS  CBG MONITORING, ED  EKG EKG Interpretation  Date/Time:  Tuesday January 27 2018 18:46:07 EDT Ventricular Rate:  80 PR Interval:  146 QRS Duration: 80 QT Interval:  388 QTC Calculation: 447 R Axis:   -44 Text Interpretation:  Sinus rhythm with Premature atrial complexes Left axis deviation Septal infarct , age undetermined Abnormal ECG No STEMI Confirmed by Theda Belfastegeler, Chris ([id number]) on 01/28/2018 6:13:55 PM   Radiology No results found.  Procedures Procedures (including critical care time)  Medications Ordered in ED Medications  ipratropium-albuterol (DUONEB) 0.5-2.5 (3) MG/3ML nebulizer solution 3 mL (3 mLs Nebulization Given 01/27/18 1949)  methylPREDNISolone sodium succinate (SOLU-MEDROL) 125 mg/2 mL injection 125 mg (125 mg Intravenous Given 01/28/18 0040)  magnesium sulfate IVPB 2 g 50 mL (0 g Intravenous Stopped 01/28/18 0145)  levofloxacin (LEVAQUIN) IVPB 500 mg (0 mg Intravenous Stopped 01/28/18 0324)     Initial Impression / Assessment and Plan / ED Course  I have reviewed the triage vital signs and the nursing notes.  Pertinent labs & imaging results that were available during my care of the  patient were reviewed by me and considered in my medical decision making (see chart for details).    Patient chest x-ray suggests atypical pneumonia.  She will be admitted to medicine   Final Clinical Impressions(s) / ED Diagnoses   Final diagnoses:  SOB (shortness of breath)    ED Discharge Orders        Ordered    levofloxacin (LEVAQUIN) 500 MG tablet  Daily     01/29/18 0911    nicotine (NICODERM CQ - DOSED IN MG/24 HOURS) 14 mg/24hr patch  Daily PRN     01/29/18 0911    Call MD for:  extreme fatigue     01/29/18 0911    Call MD for:  persistant dizziness or light-headedness     01/29/18 0911    Call MD for:  difficulty breathing, headache or visual disturbances     01/29/18 [id number]       Bethann BerkshireZammit, Dannetta Lekas, MD 01/30/18 1444

## 2018-02-02 ENCOUNTER — Telehealth: Payer: Self-pay | Admitting: Family Medicine

## 2018-02-02 NOTE — Telephone Encounter (Signed)
Called patient for TOC, no answer, no option to leave message. Today is last day for TOC.

## 2018-02-02 NOTE — Telephone Encounter (Signed)
Called patient regarding message below. No answer, unable to leave message.  

## 2018-02-02 NOTE — Telephone Encounter (Signed)
Message left for pt to call to be seen this Wednesday for hospital follow up on listed nuumber

## 2018-03-12 ENCOUNTER — Other Ambulatory Visit: Payer: Self-pay | Admitting: Family Medicine

## 2018-03-16 ENCOUNTER — Ambulatory Visit: Payer: Medicare Other

## 2018-03-16 DIAGNOSIS — L602 Onychogryphosis: Secondary | ICD-10-CM

## 2018-03-20 ENCOUNTER — Telehealth: Payer: Self-pay

## 2018-03-20 ENCOUNTER — Ambulatory Visit (INDEPENDENT_AMBULATORY_CARE_PROVIDER_SITE_OTHER): Payer: Medicare Other

## 2018-03-20 VITALS — BP 144/68 | HR 78 | Resp 16 | Ht 60.0 in | Wt 179.0 lb

## 2018-03-20 DIAGNOSIS — Z0000 Encounter for general adult medical examination without abnormal findings: Secondary | ICD-10-CM | POA: Diagnosis not present

## 2018-03-20 MED ORDER — HYDROCHLOROTHIAZIDE 12.5 MG PO CAPS: 12.5000 mg | ORAL_CAPSULE | Freq: Every day | ORAL | 0 refills | Status: DC

## 2018-03-20 NOTE — Telephone Encounter (Signed)
[name] in for her AWV and her daughter has noticed swelling in her lower extremities and when I checked them,  Both lower legs were shiny and the left was draining a clear light yellow pus. No soreness but daughter is requesting a short term fluid pill until she can be evaluated. They have also been elevating her legs as able. Please advise

## 2018-03-20 NOTE — Telephone Encounter (Signed)
I went in and saw patient while she was still in the office.  She did have 1+ pitting edema in her lower extremities bilaterally.  There was some clear serous drainage on the left side.  There is no associated skin lesions or warmth.  Reports has had intermittent swelling in lower extremities for quite some time on and off.  She is not in any pain.  Is not short of breath.  Blood pressure slightly elevated today.  Will start HCTZ 12.5 mg 1 p.o. daily.  She was told to follow-up in 1 to 2 weeks or sooner if needed.  We will plan to recheck BMP at that time.  Questions were answered.

## 2018-03-20 NOTE — Progress Notes (Signed)
Subjective:   [name] is a 84 y.o. female who presents for Medicare Annual (Subsequent) preventive examination.  Review of Systems:         Objective:     Vitals: BP (!) 144/68   Pulse 78   Resp 16   Ht 5' (1.524 m)   Wt 179 lb (81.2 kg)   SpO2 91%   BMI 34.96 kg/m   Body mass index is 34.96 kg/m.  Advanced Directives 03/20/2018 01/28/2018 02/03/2017 09/23/2016 09/15/2014 01/30/2012  Does Patient Have a Medical Advance Directive? Yes No No No No Patient does not have advance directive  Type of Public librarian Power of [place];Living will - - - - -  Copy of Healthcare Power of Attorney in Chart? No - copy requested - - - - -  Would patient like information on creating a medical advance directive? - No - Patient declined Yes (MAU/Ambulatory/Procedural Areas - Information given) No - patient declined information Yes - Educational materials given -  Pre-existing out of facility DNR order (yellow form or pink MOST form) - - - - - No    Tobacco Social History   Tobacco Use  Smoking Status Former Smoker  . Types: Cigarettes  Smokeless Tobacco Never Used  Tobacco Comment   quit recently      Counseling given: Not Answered Comment: quit recently    Clinical Intake:  Pre-visit preparation completed: Yes  Pain : No/denies pain Pain Score: 0-No pain        How often do you need to have someone help you when you read instructions, pamphlets, or other written materials from your doctor or pharmacy?: 1 - Never  Interpreter Needed?: No  Information entered by :: Everitt Amber, LPN   Past Medical History:  Diagnosis Date  . Colon polyps   . Coronary atherosclerosis of native coronary artery October 2005.   Cardiac cath revealed 70% circumflex stenosis. Treated medically. Ejection fraction 55-65%.  . Dementia   . Depression    no symptoms expressed in 2014  . Diastolic dysfunction 02/2012  . Essential hypertension, benign   . Hyperlipidemia    diet controlled, med compliance a prob  . Myocardial infarction [facility]) October 2005  . Obesity    Past Surgical History:  Procedure Laterality Date  . ABDOMINAL HYSTERECTOMY    . APPENDECTOMY     Age 67  . VESICOVAGINAL FISTULA CLOSURE W/ TAH     Family History  Problem Relation Age of Onset  . Heart attack Mother   . Cancer Mother        Breast   . Alcohol abuse Father   . Heart attack Sister   . Diabetes Sister    Social History   Socioeconomic History  . Marital status: Divorced    Spouse name: Not on file  . Number of children: Not on file  . Years of education: Not on file  . Highest education level: Not on file  Occupational History  . Not on file  Social Needs  . Financial resource strain: Not on file  . Food insecurity:    Worry: Never true    Inability: Never true  . Transportation needs:    Medical: No    Non-medical: No  Tobacco Use  . Smoking status: Former Smoker    Types: Cigarettes  . Smokeless tobacco: Never Used  . Tobacco comment: quit recently   Substance and Sexual Activity  . Alcohol use: No    Alcohol/week: 0.0  oz  . Drug use: No  . Sexual activity: Never    Birth control/protection: Surgical  Lifestyle  . Physical activity:    Days per week: 0 days    Minutes per session: 0 min  . Stress: Not at all  Relationships  . Social connections:    Talks on phone: Once a week    Gets together: Once a week    Attends religious service: Never    Active member of club or organization: No    Attends meetings of clubs or organizations: Never    Relationship status: Divorced  Other Topics Concern  . Not on file  Social History Narrative  . Not on file    Outpatient Encounter Medications as of 03/20/2018  Medication Sig  . aspirin EC 81 MG tablet Take 1 tablet (81 mg total) by mouth daily.  . metoprolol succinate (TOPROL-XL) 25 MG 24 hr tablet TAKE 1 TABLET BY MOUTH ONCE DAILY.  . nicotine (NICODERM CQ - DOSED IN MG/24 HOURS) 14 mg/24hr  patch Place 1 patch (14 mg total) onto the skin daily as needed (For nicotine withdrawal symptoms`).  . polyethylene glycol (MIRALAX) packet Take 17 g by mouth daily. (Patient taking differently: Take 17 g by mouth daily as needed. )  . Vitamin D, Ergocalciferol, (DRISDOL) 50000 units CAPS capsule TAKE 1 CAPSULE BY MOUTH ONCE WEEKLY.  . hydrochlorothiazide (MICROZIDE) 12.5 MG capsule Take 1 capsule (12.5 mg total) by mouth daily.   No facility-administered encounter medications on file as of 03/20/2018.     Activities of Daily Living In your present state of health, do you have any difficulty performing the following activities: 01/28/2018  Hearing? N  Vision? N  Difficulty concentrating or making decisions? Y  Walking or climbing stairs? Y  Dressing or bathing? Y  Doing errands, shopping? N  Some recent data might be hidden    Patient Care Team: Kerri Perches, MD as PCP - General    Assessment:   This is a routine wellness examination for [facility].  Exercise Activities and Dietary recommendations    Goals    . Exercise 3x per week (30 min per time)     Recommend starting a routine exercise program at least 3 days a week for 30-45 minutes at a time as tolerated.         Fall Risk Fall Risk  03/20/2018 02/03/2017 03/25/2016 01/22/2016 01/16/2015  Falls in the past year? No No Yes No No  Number falls in past yr: - - 2 or more - -  Injury with Fall? - - Yes - -  Risk Factor Category  - - High Fall Risk - -  Risk for fall due to : - - History of fall(s);Impaired balance/gait - -   Is the patient's home free of loose throw rugs in walkways, pet beds, electrical cords, etc?   yes      Grab bars in the bathroom? yes      Handrails on the stairs?   yes      Adequate lighting?   yes  Depression Screen PHQ 2/9 Scores 03/20/2018 02/03/2017 03/25/2016 09/15/2014  PHQ - 2 Score 0 0 0 0     Cognitive Function MMSE - Mini Mental State Exam 02/03/2017 03/25/2016 01/16/2015  Orientation to  time 0 0 0  Orientation to Place Registration Attention/ Calculation Recall 0 0 0  Language- name 2 objects 2 1  2  Language- repeat Language- follow 3 step command Language- read & follow direction 1 0 1  Write a sentence 1 0 1  Copy design 0 1 0  Total score 6CIT Screen 03/20/2018  What Year? 0 points  What month? 0 points  What time? 3 points  Count back from 20 4 points  Months in reverse 4 points  Repeat phrase 6 points  Total Score 17    Immunization History  Administered Date(s) Administered  . H1N1 11/15/2008  . Influenza Split 07/22/2012  . Influenza Whole 08/04/2007, 08/02/2008, 09/06/2009, 08/07/2010, 07/12/2011  . Influenza,inj,Quad PF,6+ Mos 09/16/2013, 09/15/2014, 08/23/2015, 08/21/2016, 09/23/2017  . Pneumococcal Conjugate-13 05/16/2014  . Pneumococcal Polysaccharide-23 05/22/2004  . Td 05/12/2004  . Zoster 03/19/2007    Qualifies for Shingles Vaccine?  Check with insurance   Screening Tests Health Maintenance  Topic Date Due  . TETANUS/TDAP  05/11/2024 (Originally 05/12/2014)  . INFLUENZA VACCINE  06/04/2018  . DEXA SCAN  Completed  . PNA vac Low Risk Adult  Completed    Cancer Screenings: Lung: Low Dose CT Chest recommended if Age 96-80 years, 30 pack-year currently smoking OR have quit w/in 15years. Patient does not qualify. Breast:  Up to date on Mammogram? Yes   Up to date of Bone Density/Dexa? Yes Colorectal: N/A   Plan:    I have personally reviewed and noted the following in the patient's chart:   . Medical and social history . Use of alcohol, tobacco or illicit drugs  . Current medications and supplements . Functional ability and status . Nutritional status . Physical activity . Advanced directives . List of other physicians . Hospitalizations, surgeries, and ER visits in previous 12 months . Vitals . Screenings to include cognitive, depression, and falls . Referrals and  appointments  In addition, I have reviewed and discussed with patient certain preventive protocols, quality metrics, and best practice recommendations. A written personalized care plan for preventive services as well as general preventive health recommendations were provided to patient.     Everitt Amber, LPN, LPN  1/61/0960

## 2018-03-20 NOTE — Patient Instructions (Signed)
[name] , Thank you for taking time to come for your Medicare Wellness Visit. I appreciate your ongoing commitment to your health goals. Please review the following plan we discussed and let me know if I can assist you in the future.   Screening recommendations/referrals: Colonoscopy: n/a  Mammogram: n/a  Bone Density: n/a Recommended yearly ophthalmology/optometry visit for glaucoma screening and checkup Recommended yearly dental visit for hygiene and checkup  Vaccinations: Influenza vaccine: complete Pneumococcal vaccine: complete 05/16/14 Tdap vaccine: postponed Shingles vaccine: done 03/2007    Advanced directives: form given   Conditions/risks identified: done   Next appointment: Need to schedule    Preventive Care 65 Years and Older, Female Preventive care refers to lifestyle choices and visits with your health care provider that can promote health and wellness. What does preventive care include?  A yearly physical exam. This is also called an annual well check.  Dental exams once or twice a year.  Routine eye exams. Ask your health care provider how often you should have your eyes checked.  Personal lifestyle choices, including:  Daily care of your teeth and gums.  Regular physical activity.  Eating a healthy diet.  Avoiding tobacco and drug use.  Limiting alcohol use.  Practicing safe sex.  Taking low-dose aspirin every day.  Taking vitamin and mineral supplements as recommended by your health care provider. What happens during an annual well check? The services and screenings done by your health care provider during your annual well check will depend on your age, overall health, lifestyle risk factors, and family history of disease. Counseling  Your health care provider may ask you questions about your:  Alcohol use.  Tobacco use.  Drug use.  Emotional well-being.  Home and relationship well-being.  Sexual activity.  Eating  habits.  History of falls.  Memory and ability to understand (cognition).  Work and work Astronomer.  Reproductive health. Screening  You may have the following tests or measurements:  Height, weight, and BMI.  Blood pressure.  Lipid and cholesterol levels. These may be checked every 5 years, or more frequently if you are over 30 years old.  Skin check.  Lung cancer screening. You may have this screening every year starting at age 18 if you have a 30-pack-year history of smoking and currently smoke or have quit within the past 15 years.  Fecal occult blood test (FOBT) of the stool. You may have this test every year starting at age 82.  Flexible sigmoidoscopy or colonoscopy. You may have a sigmoidoscopy every 5 years or a colonoscopy every 10 years starting at age 35.  Hepatitis C blood test.  Hepatitis B blood test.  Sexually transmitted disease (STD) testing.  Diabetes screening. This is done by checking your blood sugar (glucose) after you have not eaten for a while (fasting). You may have this done every 1-3 years.  Bone density scan. This is done to screen for osteoporosis. You may have this done starting at age 28.  Mammogram. This may be done every 1-2 years. Talk to your health care provider about how often you should have regular mammograms. Talk with your health care provider about your test results, treatment options, and if necessary, the need for more tests. Vaccines  Your health care provider may recommend certain vaccines, such as:  Influenza vaccine. This is recommended every year.  Tetanus, diphtheria, and acellular pertussis (Tdap, Td) vaccine. You may need a Td booster every 10 years.  Zoster vaccine. You may need this  after age 82.  Pneumococcal 13-valent conjugate (PCV13) vaccine. One dose is recommended after age 55.  Pneumococcal polysaccharide (PPSV23) vaccine. One dose is recommended after age 8. Talk to your health care provider about which  screenings and vaccines you need and how often you need them. This information is not intended to replace advice given to you by your health care provider. Make sure you discuss any questions you have with your health care provider. Document Released: 11/17/2015 Document Revised: 07/10/2016 Document Reviewed: 08/22/2015 Elsevier Interactive Patient Education  2017 [employer] Prevention in the Home Falls can cause injuries. They can happen to people of all ages. There are many things you can do to make your home safe and to help prevent falls. What can I do on the outside of my home?  Regularly fix the edges of walkways and driveways and fix any cracks.  Remove anything that might make you trip as you walk through a door, such as a raised step or threshold.  Trim any bushes or trees on the path to your home.  Use bright outdoor lighting.  Clear any walking paths of anything that might make someone trip, such as rocks or tools.  Regularly check to see if handrails are loose or broken. Make sure that both sides of any steps have handrails.  Any raised decks and porches should have guardrails on the edges.  Have any leaves, snow, or ice cleared regularly.  Use sand or salt on walking paths during winter.  Clean up any spills in your garage right away. This includes oil or grease spills. What can I do in the bathroom?  Use night lights.  Install grab bars by the toilet and in the tub and shower. Do not use towel bars as grab bars.  Use non-skid mats or decals in the tub or shower.  If you need to sit down in the shower, use a plastic, non-slip stool.  Keep the floor dry. Clean up any water that spills on the floor as soon as it happens.  Remove soap buildup in the tub or shower regularly.  Attach bath mats securely with double-sided non-slip rug tape.  Do not have throw rugs and other things on the floor that can make you trip. What can I do in the bedroom?  Use  night lights.  Make sure that you have a light by your bed that is easy to reach.  Do not use any sheets or blankets that are too big for your bed. They should not hang down onto the floor.  Have a firm chair that has side arms. You can use this for support while you get dressed.  Do not have throw rugs and other things on the floor that can make you trip. What can I do in the kitchen?  Clean up any spills right away.  Avoid walking on wet floors.  Keep items that you use a lot in easy-to-reach places.  If you need to reach something above you, use a strong step stool that has a grab bar.  Keep electrical cords out of the way.  Do not use floor polish or wax that makes floors slippery. If you must use wax, use non-skid floor wax.  Do not have throw rugs and other things on the floor that can make you trip. What can I do with my stairs?  Do not leave any items on the stairs.  Make sure that there are handrails on both sides of the  stairs and use them. Fix handrails that are broken or loose. Make sure that handrails are as long as the stairways.  Check any carpeting to make sure that it is firmly attached to the stairs. Fix any carpet that is loose or worn.  Avoid having throw rugs at the top or bottom of the stairs. If you do have throw rugs, attach them to the floor with carpet tape.  Make sure that you have a light switch at the top of the stairs and the bottom of the stairs. If you do not have them, ask someone to add them for you. What else can I do to help prevent falls?  Wear shoes that:  Do not have high heels.  Have rubber bottoms.  Are comfortable and fit you well.  Are closed at the toe. Do not wear sandals.  If you use a stepladder:  Make sure that it is fully opened. Do not climb a closed stepladder.  Make sure that both sides of the stepladder are locked into place.  Ask someone to hold it for you, if possible.  Clearly mark and make sure that you  can see:  Any grab bars or handrails.  First and last steps.  Where the edge of each step is.  Use tools that help you move around (mobility aids) if they are needed. These include:  Canes.  Walkers.  Scooters.  Crutches.  Turn on the lights when you go into a dark area. Replace any light bulbs as soon as they burn out.  Set up your furniture so you have a clear path. Avoid moving your furniture around.  If any of your floors are uneven, fix them.  If there are any pets around you, be aware of where they are.  Review your medicines with your doctor. Some medicines can make you feel dizzy. This can increase your chance of falling. Ask your doctor what other things that you can do to help prevent falls. This information is not intended to replace advice given to you by your health care provider. Make sure you discuss any questions you have with your health care provider. Document Released: 08/17/2009 Document Revised: 03/28/2016 Document Reviewed: 11/25/2014 Elsevier Interactive Patient Education  2017 [employer].

## 2018-04-14 ENCOUNTER — Encounter: Payer: Self-pay | Admitting: Family Medicine

## 2018-04-14 ENCOUNTER — Ambulatory Visit (INDEPENDENT_AMBULATORY_CARE_PROVIDER_SITE_OTHER): Payer: Medicare Other | Admitting: Family Medicine

## 2018-04-14 VITALS — BP 122/78 | HR 94 | Resp 16 | Ht 60.0 in | Wt 175.8 lb

## 2018-04-14 DIAGNOSIS — E785 Hyperlipidemia, unspecified: Secondary | ICD-10-CM

## 2018-04-14 DIAGNOSIS — I10 Essential (primary) hypertension: Secondary | ICD-10-CM | POA: Diagnosis not present

## 2018-04-14 DIAGNOSIS — Z9181 History of falling: Secondary | ICD-10-CM | POA: Diagnosis not present

## 2018-04-14 DIAGNOSIS — G301 Alzheimer's disease with late onset: Secondary | ICD-10-CM | POA: Diagnosis not present

## 2018-04-14 DIAGNOSIS — E559 Vitamin D deficiency, unspecified: Secondary | ICD-10-CM

## 2018-04-14 DIAGNOSIS — B351 Tinea unguium: Secondary | ICD-10-CM

## 2018-04-14 DIAGNOSIS — R7303 Prediabetes: Secondary | ICD-10-CM | POA: Diagnosis not present

## 2018-04-14 DIAGNOSIS — K5900 Constipation, unspecified: Secondary | ICD-10-CM | POA: Diagnosis not present

## 2018-04-14 DIAGNOSIS — F0280 Dementia in other diseases classified elsewhere without behavioral disturbance: Secondary | ICD-10-CM | POA: Diagnosis not present

## 2018-04-14 MED ORDER — HYDROCHLOROTHIAZIDE 12.5 MG PO CAPS: 12.5000 mg | ORAL_CAPSULE | Freq: Every day | ORAL | 5 refills | Status: DC

## 2018-04-14 NOTE — Patient Instructions (Addendum)
Annual  Physical exam  Early December, call if you need me before  cBc and chem 7 and EGFR today  And vit D level today, will contact niece, Stevenson Clinch with results Good no more cigarettes  Start OTC potassium 99 mg or 100 mg one daily  Be careful  Not to fall, keep using the walker  Al;l the best

## 2018-04-14 NOTE — Assessment & Plan Note (Signed)
Toenails are thick and long, needs podiatry to cut

## 2018-04-15 LAB — BASIC METABOLIC PANEL WITH GFR
BUN: 16 mg/dL (ref 7–25)
CHLORIDE: 104 mmol/L (ref 98–110)
CO2: 29 mmol/L (ref 20–32)
CREATININE: 0.69 mg/dL (ref 0.60–0.88)
Calcium: 9.4 mg/dL (ref 8.6–10.4)
GFR, EST AFRICAN AMERICAN: 89 mL/min/{1.73_m2} (ref >=60)
GFR, Est Non African American: 77 mL/min/{1.73_m2} (ref >=60)
GLUCOSE: 103 mg/dL — AB (ref 65–99)
Potassium: 4.2 mmol/L (ref 3.5–5.3)
SODIUM: 140 mmol/L (ref 135–146)

## 2018-04-15 LAB — VITAMIN D 25 HYDROXY (VIT D DEFICIENCY, FRACTURES): VIT D 25 HYDROXY: 63 ng/mL (ref 30–100)

## 2018-04-15 LAB — CBC
HCT: 38.2 % (ref 35.0–45.0)
Hemoglobin: 12.4 g/dL (ref 11.7–15.5)
MCH: 26.1 pg — AB (ref 27.0–33.0)
MCHC: 32.5 g/dL (ref 32.0–36.0)
MCV: 80.4 fL (ref 80.0–100.0)
MPV: 11.6 fL (ref 7.5–12.5)
Platelets: 302 10*3/uL (ref 140–400)
RBC: 4.75 10*6/uL (ref 3.80–5.10)
RDW: 14.6 % (ref 11.0–15.0)
WBC: 7.4 10*3/uL (ref 3.8–10.8)

## 2018-04-17 ENCOUNTER — Encounter: Payer: Self-pay | Admitting: Podiatry

## 2018-04-17 ENCOUNTER — Ambulatory Visit: Payer: Medicare Other | Admitting: Podiatry

## 2018-04-17 ENCOUNTER — Encounter: Payer: Self-pay | Admitting: Family Medicine

## 2018-04-17 VITALS — BP 96/74 | HR 76

## 2018-04-17 DIAGNOSIS — M79674 Pain in right toe(s): Secondary | ICD-10-CM | POA: Diagnosis not present

## 2018-04-17 DIAGNOSIS — M79675 Pain in left toe(s): Secondary | ICD-10-CM

## 2018-04-17 DIAGNOSIS — B351 Tinea unguium: Secondary | ICD-10-CM | POA: Diagnosis not present

## 2018-04-17 DIAGNOSIS — I739 Peripheral vascular disease, unspecified: Secondary | ICD-10-CM | POA: Diagnosis not present

## 2018-04-17 MED ORDER — CICLOPIROX OLAMINE 0.77 % EX CREA: TOPICAL_CREAM | Freq: Two times a day (BID) | CUTANEOUS | 0 refills | Status: DC

## 2018-04-17 NOTE — Progress Notes (Signed)
   Hannah LoanMary E Olson     MRN: [id number]      DOB: 1926-01-25   HPI [name] is here for follow up and re-evaluation of chronic medical conditions, medication management and review of any available recent lab and radiology data.  Hospitalized with respiratory failure earlier this year, since then has not smoked  The PT denies any adverse reactions to current medications since the last visit.  There are no new concerns.  There are no specific complaints  Patient and her niece report that she is doing well. Has appt for foot care in the next few days  ROS Denies recent fever or chills. Denies sinus pressure, nasal congestion, ear pain or sore throat. Denies chest congestion, productive cough or wheezing. Denies chest pains, palpitations and leg swelling Denies abdominal pain, nausea, vomiting,diarrhea or constipation.   Denies dysuria, frequency, hesitancy or incontinence. Denies  uncontrol joint pain, swelling and she does have  limitation in mobility.She has had no fallsDenies headaches, seizures, numbness, or tingling. Denies depression, anxiety or insomnia. Denies skin break down or rash.   PE  BP 122/78   Pulse 94   Resp 16   Ht 5' (1.524 m)   Wt 175 lb 12.8 oz (79.7 kg)   SpO2 93%   BMI 34.33 kg/m   Patient alert and in no cardiopulmonary distress.  HEENT: No facial asymmetry, EOMI,   oropharynx pink and moist.  Neck decreased ROMe no JVD, no mass.  Chest: Clear to auscultation bilaterally.Decreased air entry  CVS: S1, S2 no murmurs, no S3.Regular rate.  ABD: Soft non tender.   Ext: No edema  MS: Decreased ROM spine, shoulders, hips and knees.  Skin: Intact, no ulcerations or rash noted.  Psych: Good eye contact, normal affect. Memory severely  impaired not anxious or depressed appearing.  CNS: CN 2-12 intact, power,  normal throughout.no focal deficits noted.   Assessment & Plan  Onychomycosis Toenails are thick and long, needs podiatry to cut    Essential hypertension, benign Controlled, no change in medication DASH diet and commitment to daily physical activity for a minimum of 30 minutes discussed and encouraged, as a part of hypertension management. The importance of attaining a healthy weight is also discussed.  BP/Weight 04/14/2018 03/20/2018 01/29/2018 09/23/2017 02/03/2017 09/24/2016 09/23/2016  Systolic BP 122 144 177 140 130 157 -  Diastolic BP 78 68 92 74 82 72 -  Wt. (Lbs) 175.8 179 171.96 172 181.12 - 180  BMI 34.33 34.96 30.46 33.59 35.37 - 35.15       Dementia Stable, needs re evaluation will schedule AWV  Constipation Controlled with mira lax   Patient had 2 or more falls in past year Home safety discussed with caregiver and patient, no falls since last visit  Hyperlipidemia LDL goal <100 Hyperlipidemia:Low fat diet discussed and encouraged.   Lipid Panel  Lab Results  Component Value Date   CHOL 181 09/23/2017   HDL 59 09/23/2017   LDLCALC 105 (H) 09/23/2017   TRIG 82 09/23/2017   CHOLHDL 3.1 09/23/2017  adequately managed      Vitamin D deficiency Continue weekly supplement

## 2018-04-17 NOTE — Assessment & Plan Note (Signed)
Home safety discussed with caregiver and patient, no falls since last visit

## 2018-04-17 NOTE — Assessment & Plan Note (Signed)
Hyperlipidemia:Low fat diet discussed and encouraged.   Lipid Panel  Lab Results  Component Value Date   CHOL 181 09/23/2017   HDL 59 09/23/2017   LDLCALC 105 (H) 09/23/2017   TRIG 82 09/23/2017   CHOLHDL 3.1 09/23/2017  adequately managed

## 2018-04-17 NOTE — Assessment & Plan Note (Signed)
Continue weekly supplement 

## 2018-04-17 NOTE — Assessment & Plan Note (Signed)
Controlled, no change in medication DASH diet and commitment to daily physical activity for a minimum of 30 minutes discussed and encouraged, as a part of hypertension management. The importance of attaining a healthy weight is also discussed.  BP/Weight 04/14/2018 03/20/2018 01/29/2018 09/23/2017 02/03/2017 09/24/2016 09/23/2016  Systolic BP 122 144 177 140 130 157 -  Diastolic BP 78 68 92 74 82 72 -  Wt. (Lbs) 175.8 179 171.96 172 181.12 - 180  BMI 34.33 34.96 30.46 33.59 35.37 - 35.15

## 2018-04-17 NOTE — Assessment & Plan Note (Signed)
Controlled with miralax.   

## 2018-04-17 NOTE — Assessment & Plan Note (Signed)
Stable, needs re evaluation will schedule AWV

## 2018-04-19 ENCOUNTER — Encounter: Payer: Self-pay | Admitting: Podiatry

## 2018-04-19 NOTE — Progress Notes (Signed)
[name] is a 84 y.o. [place]AF[place] who presents today with cc of severely thickened, dystrophic, elongated toenails b/l feet. Her niece is present today with her. Niece states family has been trying to get [name] to seek professional help for her nails, but she has declined in the past. Lately, it has become unmanageable and [name] agreed to seek professional help.  Medical History Collapse by Default  Collapse by Default      02/2012 Diastolic dysfunction  October 16102005. Coronary atherosclerosis of native coronary artery   October 2005 Myocardial infarction [facility](HCC)  Date Unknown Colon polyps  Date Unknown Dementia  Date Unknown Depression   Date Unknown Essential hypertension, benign  Date Unknown Hyperlipidemia   Date Unknown Obesity   Problem List Collapse by Default  Collapse by Default      Cardiovascular and Mediastinum  Essential hypertension, benign   Coronary atherosclerosis of native coronary artery   Respiratory  COPD exacerbation (HCC)   Nervous and Auditory  Dementia   CARPAL TUNNEL SYNDROME, BILATERAL   Musculoskeletal and Integument  Onychomycosis   Other  Prediabetes   Vitamin D deficiency   Hyperlipidemia LDL goal <100   OBESITY   FATIGUE   Chest pain   Diastolic dysfunction   Patient had 2 or more falls in past year   At high risk for falls   Constipation     Surgical History Collapse by Default  Collapse by Default      Date Unknown Abdominal hysterectomy  Date Unknown Appendectomy   Date Unknown Vesicovaginal fistula closure w/ tah   Medications    aspirin EC 81 MG tablet    hydrochlorothiazide (MICROZIDE) 12.5 MG capsule    metoprolol succinate (TOPROL-XL) 25 MG 24 hr tablet    polyethylene glycol (MIRALAX) packet    Vitamin D, Ergocalciferol, (DRISDOL) 50000 units CAPS capsule    Allergies      No Known Allergies   Tobacco History Collapse by Default  Collapse by Default      Smoking Status  Former  Smoker  Types  Cigarettes     Smokeless Tobacco Status  Never Used        Comment  quit recently    Family History Collapse by Default  Collapse by Default      Mother (Deceased) Heart attack    Cancer          Father Alcohol abuse         Sister (Deceased) Heart attack    Diabetes     Objective: Vitals:  BP: 96/79 Pulse: 74  Vascular Examination: Capillary refill time <3 seconds x 10 digits Dorsalis pedis pulses 1/4 b/l Posterior tibial pulses nonpalpable b/l No digital hair x 10 digits Skin temperature warm to warm b/l  Dermatological Examination: Skin thin and atrophic b/l Toenails 1-5 b/l discolored, thick, dystrophic with subungual debris and pain with palpation to nailbeds due to thickness of nails. Left great toe nailplate noted to be growing vertically and curving proximally. Nailplate extremely crumbly with debridement. Entire nailplate with onycholysis. Nailbed covered with mycotic debris. No underlying edema, no erythema, no drainage, no flocculence.  Musculoskeletal: Muscle strength 5/5 to all LE muscle groups  Neurological: Sensation intact with 10 gram monofilament. UTA vibratory sensation due to cognition  Assessment: 1. Painful onychomycosis toenails 1-5 b/l  2. PAD  Plan: 1. Toenails 1-5 b/l were debrided in length and girth without iatrogenic bleeding. 2. For left great toe nailbed, prescription written for  Loprox Cream to be applied to nailbed once daily. 3. Patient to continue soft, supportive shoe gear 4. Patient to report any pedal injuries to medical professional immediately. 5. Follow up 3 months.  6. Patient/POA to call should there be a concern in the interim.

## 2018-05-26 ENCOUNTER — Other Ambulatory Visit: Payer: Self-pay | Admitting: Family Medicine

## 2018-07-24 ENCOUNTER — Encounter: Payer: Self-pay | Admitting: Podiatry

## 2018-07-24 ENCOUNTER — Ambulatory Visit: Payer: Medicare Other | Admitting: Podiatry

## 2018-07-24 DIAGNOSIS — I739 Peripheral vascular disease, unspecified: Secondary | ICD-10-CM

## 2018-07-24 DIAGNOSIS — B351 Tinea unguium: Secondary | ICD-10-CM

## 2018-07-24 DIAGNOSIS — M79675 Pain in left toe(s): Secondary | ICD-10-CM

## 2018-07-24 DIAGNOSIS — M79674 Pain in right toe(s): Secondary | ICD-10-CM | POA: Diagnosis not present

## 2018-07-29 ENCOUNTER — Encounter: Payer: Self-pay | Admitting: Podiatry

## 2018-07-29 NOTE — Progress Notes (Signed)
Subjective: [name] presents today peripheral arterial disease and follow up preventative care of painful, discolored, thick toenails b/l feet. Pain is aggravated when wearing enclosed shoe gear and is relieved with periodic professional debridement.   Objective: Vascular Examination: Capillary refill time <3 seconds x 10 digits Dorsalis pedis pulses 1/4 b/l Posterior tibial pulses absent b/l No digital hair x 10 digits Skin temperature warm to warm b/l  Dermatological Examination: Skin thin and atrophic b/l Toenails 2-5 b/l and right great toe discolored, thick, dystrophic with subungual debris and pain with palpation to nailbeds due to thickness of nails. No underlying erythema, no edema, no drainage, no flocculence  Anonychia left great toe. Nailbed intact with no erythema, no edema, no drainage.  Musculoskeletal: Muscle strength 5/5 to all LE muscle groups  Neurological: Sensation intact with 10 gram monofilament b/l UTA vibratory sensation due to cognition  Assessment: 1. Painful onychomycosis toenails right great toe and 2-5 b/l 2. Peripheral arterial disease 3. Anonychia left great toe  Plan: 1. Toenails right great toe and 2-5 b/l were debrided in length and girth without iatrogenic bleeding. 2. Patient to continue soft, supportive shoe gear 3. Patient to report any pedal injuries to medical professional  4. Follow up 3 months.  5. Patient/POA to call should there be a concern in the interim.

## 2018-08-02 ENCOUNTER — Other Ambulatory Visit: Payer: Self-pay | Admitting: Family Medicine

## 2018-08-15 ENCOUNTER — Other Ambulatory Visit: Payer: Self-pay | Admitting: Family Medicine

## 2018-09-04 ENCOUNTER — Emergency Department (HOSPITAL_COMMUNITY): Payer: Medicare Other

## 2018-09-04 ENCOUNTER — Encounter (HOSPITAL_COMMUNITY): Payer: Self-pay

## 2018-09-04 ENCOUNTER — Other Ambulatory Visit: Payer: Self-pay

## 2018-09-04 ENCOUNTER — Observation Stay (HOSPITAL_COMMUNITY)
Admission: EM | Admit: 2018-09-04 | Discharge: 2018-09-09 | Disposition: A | Discharge status: Home / Self Care | Payer: Medicare Other | Attending: Internal Medicine | Admitting: Internal Medicine

## 2018-09-04 DIAGNOSIS — R4020 Unspecified coma: Secondary | ICD-10-CM | POA: Diagnosis not present

## 2018-09-04 DIAGNOSIS — R262 Difficulty in walking, not elsewhere classified: Secondary | ICD-10-CM

## 2018-09-04 DIAGNOSIS — Z87891 Personal history of nicotine dependence: Secondary | ICD-10-CM | POA: Diagnosis not present

## 2018-09-04 DIAGNOSIS — Z7982 Long term (current) use of aspirin: Secondary | ICD-10-CM | POA: Insufficient documentation

## 2018-09-04 DIAGNOSIS — M6282 Rhabdomyolysis: Secondary | ICD-10-CM

## 2018-09-04 DIAGNOSIS — L8990 Pressure ulcer of unspecified site, unspecified stage: Secondary | ICD-10-CM | POA: Diagnosis not present

## 2018-09-04 DIAGNOSIS — R2689 Other abnormalities of gait and mobility: Secondary | ICD-10-CM | POA: Diagnosis not present

## 2018-09-04 DIAGNOSIS — T796XXA Traumatic ischemia of muscle, initial encounter: Secondary | ICD-10-CM

## 2018-09-04 DIAGNOSIS — E876 Hypokalemia: Secondary | ICD-10-CM | POA: Diagnosis not present

## 2018-09-04 DIAGNOSIS — I10 Essential (primary) hypertension: Secondary | ICD-10-CM | POA: Diagnosis not present

## 2018-09-04 DIAGNOSIS — I4891 Unspecified atrial fibrillation: Secondary | ICD-10-CM | POA: Diagnosis not present

## 2018-09-04 DIAGNOSIS — W19XXXA Unspecified fall, initial encounter: Secondary | ICD-10-CM | POA: Diagnosis not present

## 2018-09-04 DIAGNOSIS — I471 Supraventricular tachycardia, unspecified: Secondary | ICD-10-CM | POA: Diagnosis present

## 2018-09-04 DIAGNOSIS — E860 Dehydration: Secondary | ICD-10-CM | POA: Diagnosis not present

## 2018-09-04 DIAGNOSIS — R2681 Unsteadiness on feet: Secondary | ICD-10-CM | POA: Diagnosis not present

## 2018-09-04 DIAGNOSIS — F0390 Unspecified dementia without behavioral disturbance: Secondary | ICD-10-CM | POA: Diagnosis present

## 2018-09-04 DIAGNOSIS — R531 Weakness: Secondary | ICD-10-CM | POA: Diagnosis not present

## 2018-09-04 DIAGNOSIS — R000 Tachycardia, unspecified: Secondary | ICD-10-CM | POA: Diagnosis not present

## 2018-09-04 DIAGNOSIS — R0902 Hypoxemia: Secondary | ICD-10-CM | POA: Diagnosis not present

## 2018-09-04 DIAGNOSIS — Z79899 Other long term (current) drug therapy: Secondary | ICD-10-CM | POA: Insufficient documentation

## 2018-09-04 DIAGNOSIS — M25551 Pain in right hip: Secondary | ICD-10-CM | POA: Diagnosis not present

## 2018-09-04 DIAGNOSIS — S79911A Unspecified injury of right hip, initial encounter: Secondary | ICD-10-CM | POA: Diagnosis not present

## 2018-09-04 DIAGNOSIS — R55 Syncope and collapse: Secondary | ICD-10-CM | POA: Diagnosis not present

## 2018-09-04 DIAGNOSIS — G301 Alzheimer's disease with late onset: Secondary | ICD-10-CM | POA: Diagnosis not present

## 2018-09-04 LAB — COMPREHENSIVE METABOLIC PANEL
ALBUMIN: 4.1 g/dL (ref 3.5–5.0)
ALT: 14 U/L (ref 0–44)
ANION GAP: 12 (ref 5–15)
AST: 39 U/L (ref 15–41)
Alkaline Phosphatase: 72 U/L (ref 38–126)
BUN: 27 mg/dL — ABNORMAL HIGH (ref 8–23)
CALCIUM: 9.7 mg/dL (ref 8.9–10.3)
CHLORIDE: 104 mmol/L (ref 98–111)
CO2: 27 mmol/L (ref 22–32)
CREATININE: 0.8 mg/dL (ref 0.44–1.00)
GFR calc non Af Amer: >60 mL/min (ref >60)
Glucose, Bld: 122 mg/dL — ABNORMAL HIGH (ref 70–99)
Potassium: 3.2 mmol/L — ABNORMAL LOW (ref 3.5–5.1)
SODIUM: 143 mmol/L (ref 135–145)
Total Bilirubin: 1.5 mg/dL — ABNORMAL HIGH (ref 0.3–1.2)
Total Protein: 8.1 g/dL (ref 6.5–8.1)

## 2018-09-04 LAB — CBC WITH DIFFERENTIAL/PLATELET
ABS IMMATURE GRANULOCYTES: 0.03 10*3/uL (ref 0.00–0.07)
BASOS ABS: 0 10*3/uL (ref 0.0–0.1)
BASOS PCT: 0 %
Eosinophils Absolute: 0 10*3/uL (ref 0.0–0.5)
Eosinophils Relative: 0 %
HCT: 44.3 % (ref 36.0–46.0)
Hemoglobin: 13.6 g/dL (ref 12.0–15.0)
IMMATURE GRANULOCYTES: 0 %
LYMPHS PCT: 5 %
Lymphs Abs: 0.6 10*3/uL — ABNORMAL LOW (ref 0.7–4.0)
MCH: 26.1 pg (ref 26.0–34.0)
MCHC: 30.7 g/dL (ref 30.0–36.0)
MCV: 85 fL (ref 80.0–100.0)
Monocytes Absolute: 0.9 10*3/uL (ref 0.1–1.0)
Monocytes Relative: 8 %
NEUTROS ABS: 9.8 10*3/uL — AB (ref 1.7–7.7)
NEUTROS PCT: 87 %
PLATELETS: 286 10*3/uL (ref 150–400)
RBC: 5.21 MIL/uL — AB (ref 3.87–5.11)
RDW: 15.4 % (ref 11.5–15.5)
WBC: 11.4 10*3/uL — AB (ref 4.0–10.5)
nRBC: 0 % (ref 0.0–0.2)

## 2018-09-04 LAB — URINALYSIS, ROUTINE W REFLEX MICROSCOPIC
BACTERIA UA: NONE SEEN
BILIRUBIN URINE: NEGATIVE
Glucose, UA: NEGATIVE mg/dL
Ketones, ur: 5 mg/dL — AB
LEUKOCYTES UA: NEGATIVE
NITRITE: NEGATIVE
PH: 5 (ref 5.0–8.0)
Protein, ur: 100 mg/dL — AB
SPECIFIC GRAVITY, URINE: 1.027 (ref 1.005–1.030)

## 2018-09-04 LAB — CK: CK TOTAL: 572 U/L — AB (ref 38–234)

## 2018-09-04 LAB — CBG MONITORING, ED: Glucose-Capillary: 107 mg/dL — ABNORMAL HIGH (ref 70–99)

## 2018-09-04 LAB — SEDIMENTATION RATE: SED RATE: 35 mm/h — AB (ref 0–22)

## 2018-09-04 LAB — TROPONIN I: Troponin I: <0.03 ng/mL (ref <0.03)

## 2018-09-04 MED ORDER — ENOXAPARIN SODIUM 40 MG/0.4ML ~~LOC~~ SOLN
40.0000 mg | SUBCUTANEOUS | Status: DC
  Administered 2018-09-04 – 2018-09-08 (×4): 40 mg via SUBCUTANEOUS
  Filled 2018-09-04 (×5): qty 0.4

## 2018-09-04 MED ORDER — ONDANSETRON HCL 4 MG PO TABS: 4.0000 mg | ORAL_TABLET | Freq: Four times a day (QID) | ORAL | Status: DC | PRN

## 2018-09-04 MED ORDER — ASPIRIN EC 81 MG PO TBEC
81.0000 mg | DELAYED_RELEASE_TABLET | Freq: Every day | ORAL | Status: DC
  Administered 2018-09-05 – 2018-09-09 (×5): 81 mg via ORAL
  Filled 2018-09-04 (×5): qty 1

## 2018-09-04 MED ORDER — POTASSIUM CHLORIDE IN NACL 20-0.9 MEQ/L-% IV SOLN
INTRAVENOUS | Status: AC
  Administered 2018-09-04 (×2): via INTRAVENOUS
  Filled 2018-09-04: qty 1000

## 2018-09-04 MED ORDER — HYDROCODONE-ACETAMINOPHEN 5-325 MG PO TABS
1.0000 | ORAL_TABLET | ORAL | Status: DC | PRN
  Administered 2018-09-05 – 2018-09-09 (×9): 1 via ORAL
  Filled 2018-09-04 (×9): qty 1

## 2018-09-04 MED ORDER — ONDANSETRON HCL 4 MG/2ML IJ SOLN: 4.0000 mg | Freq: Four times a day (QID) | INTRAMUSCULAR | Status: DC | PRN

## 2018-09-04 MED ORDER — POLYETHYLENE GLYCOL 3350 17 G PO PACK: 17.0000 g | PACK | Freq: Every day | ORAL | Status: DC | PRN

## 2018-09-04 MED ORDER — ACETAMINOPHEN 325 MG PO TABS
650.0000 mg | ORAL_TABLET | Freq: Four times a day (QID) | ORAL | Status: DC | PRN
  Administered 2018-09-06: 650 mg via ORAL
  Filled 2018-09-04: qty 2

## 2018-09-04 MED ORDER — METOPROLOL TARTRATE 5 MG/5ML IV SOLN
5.0000 mg | Freq: Once | INTRAVENOUS | Status: AC
  Administered 2018-09-04: 5 mg via INTRAVENOUS
  Filled 2018-09-04: qty 5

## 2018-09-04 MED ORDER — ACETAMINOPHEN 650 MG RE SUPP: 650.0000 mg | Freq: Four times a day (QID) | RECTAL | Status: DC | PRN

## 2018-09-04 MED ORDER — SODIUM CHLORIDE 0.9 % IV BOLUS
1000.0000 mL | Freq: Once | INTRAVENOUS | Status: AC
  Administered 2018-09-04: 1000 mL via INTRAVENOUS

## 2018-09-04 MED ORDER — SODIUM CHLORIDE 0.9% FLUSH
3.0000 mL | Freq: Two times a day (BID) | INTRAVENOUS | Status: DC
  Administered 2018-09-04 – 2018-09-08 (×3): 3 mL via INTRAVENOUS

## 2018-09-04 MED ORDER — POTASSIUM CHLORIDE CRYS ER 20 MEQ PO TBCR
20.0000 meq | EXTENDED_RELEASE_TABLET | Freq: Once | ORAL | Status: AC
  Administered 2018-09-04: 20 meq via ORAL
  Filled 2018-09-04: qty 1

## 2018-09-04 MED ORDER — METOPROLOL SUCCINATE ER 25 MG PO TB24
25.0000 mg | ORAL_TABLET | Freq: Every day | ORAL | Status: DC
  Administered 2018-09-04 – 2018-09-09 (×6): 25 mg via ORAL
  Filled 2018-09-04 (×6): qty 1

## 2018-09-04 NOTE — ED Notes (Signed)
Pt transported to radiology.

## 2018-09-04 NOTE — ED Notes (Addendum)
Attempted ambulation. Pt could not sit up. After sitting pt up on side of bed, had to help pt stand and then had to have one assist just for pt to stand. Pt c/o right hip pain with ambulation  edp aware.

## 2018-09-04 NOTE — ED Provider Notes (Signed)
[facility] EMERGENCY DEPARTMENT Provider Note   CSN: [id number] Arrival date & time: 09/04/18  1757     History   Chief Complaint Chief Complaint  Patient presents with  . Fall    HPI [name] is a 84 y.o. female.  Pt presents to the ED today with [facility] and fall.  The pt has a hx of dementia and lives alone.  Family checks on her frequently.  They was found on the ground.  She told the family that she had lead in her feet and could not walk.  Per EMS, she had difficulty standing.  Now, she denies any pain.  She does not remember that she said she had lead in her feet.  Family member, Elnita Maxwell, said the pt was last seen by a caregiver on Wednesday afternoon (10/30).     Past Medical History:  Diagnosis Date  . Colon polyps   . Coronary atherosclerosis of native coronary artery October 2005.   Cardiac cath revealed 70% circumflex stenosis. Treated medically. Ejection fraction 55-65%.  . Dementia (HCC)   . Depression    no symptoms expressed in 2014  . Diastolic dysfunction 02/2012  . Essential hypertension, benign   . Hyperlipidemia    diet controlled, med compliance a prob  . Myocardial infarction [facility]) October 2005  . Obesity     Patient Active Problem List   Diagnosis Date Noted  . Hypokalemia 09/04/2018  . Dehydration 09/04/2018  . Generalized weakness 09/04/2018  . COPD exacerbation (HCC) 01/27/2018  . Constipation 01/27/2018  . At high risk for falls 04/01/2016  . Patient had 2 or more falls in past year 03/25/2016  . Onychomycosis 08/23/2015  . Diastolic dysfunction 09/18/2013  . Coronary atherosclerosis of native coronary artery 01/30/2012  . CARPAL TUNNEL SYNDROME, BILATERAL 09/19/2010  . Dementia (HCC) 08/10/2010  . Vitamin D deficiency 04/25/2010  . FATIGUE 04/25/2010  . Prediabetes 02/10/2008  . Hyperlipidemia LDL goal <100 02/10/2008  . OBESITY 02/10/2008  . Essential hypertension, benign 02/10/2008    Past Surgical History:  Procedure  Laterality Date  . ABDOMINAL HYSTERECTOMY    . APPENDECTOMY     Age 29  . VESICOVAGINAL FISTULA CLOSURE W/ TAH       OB History   None      Home Medications    Prior to Admission medications   Medication Sig Start Date End Date Taking? Authorizing Provider  aspirin EC 81 MG tablet Take 1 tablet (81 mg total) by mouth daily. 09/24/13   Kerri Perches, MD  ciclopirox (LOPROX) 0.77 % cream Apply topically 2 (two) times daily. Apply to nailbed of left great toe only. 04/17/18   Freddie Breech, MD  hydrochlorothiazide (MICROZIDE) 12.5 MG capsule Take 1 capsule (12.5 mg total) by mouth daily. 04/14/18   Kerri Perches, MD  metoprolol succinate (TOPROL-XL) 25 MG 24 hr tablet TAKE 1 TABLET BY MOUTH ONCE DAILY. 08/18/18   Kerri Perches, MD  polyethylene glycol [facility]) packet Take 17 g by mouth daily. Patient taking differently: Take 17 g by mouth daily as needed.  09/24/16   Vanetta Mulders, MD  Vitamin D, Ergocalciferol, (DRISDOL) 50000 units CAPS capsule TAKE 1 CAPSULE BY MOUTH ONCE WEEKLY. 08/03/18   Kerri Perches, MD    Family History Family History  Problem Relation Age of Onset  . Heart attack Mother   . Cancer Mother        Breast   . Alcohol abuse Father   .  Heart attack Sister   . Diabetes Sister     Social History Social History   Tobacco Use  . Smoking status: Former Smoker    Types: Cigarettes  . Smokeless tobacco: Never Used  . Tobacco comment: quit recently   Substance Use Topics  . Alcohol use: No    Alcohol/week: 0.0 standard drinks  . Drug use: No     Allergies   Patient has no known allergies.   Review of Systems Review of Systems  All other systems reviewed and are negative.    Physical Exam Updated Vital Signs BP (!) 149/78   Pulse 98   Temp 98.5 F (36.9 C) (Oral)   Resp 20   Wt 79.7 kg   SpO2 96%   BMI 34.32 kg/m   Physical Exam  Constitutional: She appears well-developed and well-nourished.  HENT:    Head: Normocephalic and atraumatic.  Right Ear: External ear normal.  Left Ear: External ear normal.  Nose: Nose normal.  Mouth/Throat: Oropharynx is clear and moist.  Eyes: Pupils are equal, round, and reactive to light. Conjunctivae and EOM are normal.  Neck: Normal range of motion. Neck supple.  Cardiovascular: Normal heart sounds and intact distal pulses. Tachycardia present.  Pulmonary/Chest: Effort normal and breath sounds normal.  Abdominal: Soft. Bowel sounds are normal.  Musculoskeletal: Normal range of motion.  No deformities to feet.  Chronic onychomycosis.  Neurological: She is alert.  Skin: Skin is warm. Capillary refill takes less than 2 seconds.  Psychiatric: She has a normal mood and affect. Her behavior is normal. Judgment and thought content normal.  Nursing note and vitals reviewed.    ED Treatments / Results  Labs (all labs ordered are listed, but only abnormal results are displayed) Labs Reviewed  CBC WITH DIFFERENTIAL/PLATELET - Abnormal; Notable for the following components:      Result Value   WBC 11.4 (*)    RBC 5.21 (*)    Neutro Abs 9.8 (*)    Lymphs Abs 0.6 (*)    All other components within normal limits  COMPREHENSIVE METABOLIC PANEL - Abnormal; Notable for the following components:   Potassium 3.2 (*)    Glucose, Bld 122 (*)    BUN 27 (*)    Total Bilirubin 1.5 (*)    All other components within normal limits  URINALYSIS, ROUTINE W REFLEX MICROSCOPIC - Abnormal; Notable for the following components:   Color, Urine AMBER (*)    Hgb urine dipstick SMALL (*)    Ketones, ur 5 (*)    Protein, ur 100 (*)    All other components within normal limits  CK - Abnormal; Notable for the following components:   Total CK 572 (*)    All other components within normal limits  CBG MONITORING, ED - Abnormal; Notable for the following components:   Glucose-Capillary 107 (*)    All other components within normal limits  TROPONIN I    EKG EKG  Interpretation  Date/Time:  Friday September 04 2018 20:17:41 EDT Ventricular Rate:  95 PR Interval:    QRS Duration: 89 QT Interval:  389 QTC Calculation: 489 R Axis:   6 Text Interpretation:  Sinus rhythm Right atrial enlargement Anterior infarct, old Confirmed by Jacalyn Lefevre [id number]) on 09/04/2018 8:21:09 PM   Radiology Dg Chest 2 View  Result Date: 09/04/2018 CLINICAL DATA:  Found on for with altered level consciousness EXAM: CHEST - 2 VIEW COMPARISON:  01/27/2018 FINDINGS: Cardiac shadow is within normal limits. Aortic calcifications  are noted. Lungs are well aerated bilaterally. Elevation the right hemidiaphragm is again seen. There is persistent central vascular congestion and bilateral airspace opacities seen. Slight improvement when compare with the prior study is noted. An acute on chronic infiltrative process could not be totally excluded. IMPRESSION: Patchy airspace opacities bilaterally. Portion of these are chronic in nature. Acute on chronic infiltrate cannot be totally excluded. Electronically Signed   By: Alcide Clever M.D.   On: 09/04/2018 19:20   Dg Pelvis 1-2 Views  Result Date: 09/04/2018 CLINICAL DATA:  Found on floor with altered level of consciousness EXAM: PELVIS - 1-2 VIEW COMPARISON:  None. FINDINGS: No acute fracture or dislocation is noted. Degenerative changes of lumbar spine are noted. No soft tissue changes are seen. IMPRESSION: No acute abnormality noted. Electronically Signed   By: Alcide Clever M.D.   On: 09/04/2018 19:21   Ct Head Wo Contrast  Result Date: 09/04/2018 CLINICAL DATA:  Found on floor by family with generalized weakness EXAM: CT HEAD WITHOUT CONTRAST TECHNIQUE: Contiguous axial images were obtained from the base of the skull through the vertex without intravenous contrast. COMPARISON:  04/04/2007 FINDINGS: Brain: Mild atrophic changes and chronic white matter ischemic changes are seen. No findings to suggest acute hemorrhage, acute infarction or  space-occupying mass lesion are noted. Vascular: No hyperdense vessel or unexpected calcification. Skull: Normal. Negative for fracture or focal lesion. Sinuses/Orbits: No acute finding. Other: None. IMPRESSION: Chronic atrophic and ischemic changes without acute abnormality. Electronically Signed   By: Alcide Clever M.D.   On: 09/04/2018 19:25    Procedures Procedures (including critical care time)  Medications Ordered in ED Medications  sodium chloride 0.9 % bolus 1,000 mL (0 mLs Intravenous Stopped 09/04/18 1959)  metoprolol tartrate (LOPRESSOR) injection 5 mg (5 mg Intravenous Given 09/04/18 1959)     Initial Impression / Assessment and Plan / ED Course  I have reviewed the triage vital signs and the nursing notes.  Pertinent labs & imaging results that were available during my care of the patient were reviewed by me and considered in my medical decision making (see chart for details).    Nurses went to get pt up and see if she could ambulate.  She could not even stand.  Nurse said she was weak all over, but did c/o right hip pain.  I added on a CT hip which is pending.  Pt does have mild rhabdo and was given IVFs.  It is not bad enough to require a bicarb drip.  Initial HR and BP elevated.  She probably has not taken her meds today.  She was given 5 mg of lopressor which has helped both.  Pt d/w Dr. [name] ([facility]) for admission.  Final Clinical Impressions(s) / ED Diagnoses   Final diagnoses:  Traumatic rhabdomyolysis, initial encounter [facility])  Fall, initial encounter  Ambulatory dysfunction    ED Discharge Orders    None       Jacalyn Lefevre, MD 09/04/18 2035

## 2018-09-04 NOTE — H&P (Signed)
History and Physical    [name] [id number] DOB: 1926-01-06 DOA: 09/04/2018  PCP: Fayrene Helper, MD   Patient coming from: Home   Chief Complaint: Generally weak, lethargic, found on floor   HPI: [name] is a 84 y.o. female with medical history significant for dementia, hypertension, and CAD, now presenting to the emergency department after being found on the floor at home.  Patient lives alone with family checking on her 3 times a week, is able to ambulate with a walker at baseline, was seen in her usual state on 09/02/2018, and was then found on the floor this evening.  She told family that she felt like she had "lead in her feet," and was unable to get up from the floor.  She is unable to recall if she had fallen or how she got there and family reports that her current mental status is consistent with her baseline.  She reported some right hip pain in the ED, and later denied it.  She denies any shortness of breath, chest pain, abdominal pain, vomiting, or diarrhea.  She does not remember if she took her medications today or yesterday and does not remember how long she had been on the ground.  ED Course: Upon arrival to the ED, patient is found to be afebrile, saturating 90 on room air, tachycardic and hypertensive.  EKG features a sinus rhythm and chest x-ray features patchy airspace opacities bilaterally, some of which is chronic, and with acute on chronic infiltrate not entirely excluded.  Noncontrast head CT is negative for acute intracranial abnormality.  Chemistry panel features and elevated BUN to creatinine ratio and potassium of 3.2.  CBC features a mild leukocytosis.  Troponin is undetectable.  Serum CK is mildly elevated at 572.  Patient was given a Lopressor IV push in the ED and 1 L of normal saline.  Tachycardia resolved and blood pressure normalized.  Patient complains of right hip pain again and is being sent for CT hip after negative radiographs.  She will be  observed for ongoing evaluation and management of generalized weakness and lethargy.  Review of Systems:  Unable to complete ROS secondary to the patient's clinical condition.  Past Medical History:  Diagnosis Date  . Colon polyps   . Coronary atherosclerosis of native coronary artery October 2005.   Cardiac cath revealed 70% circumflex stenosis. Treated medically. Ejection fraction 55-65%.  . Dementia ([place])   . Depression    no symptoms expressed in 2014  . Diastolic dysfunction 07/3266  . Essential hypertension, benign   . Hyperlipidemia    diet controlled, med compliance a prob  . Myocardial infarction [facility]) October 2005  . Obesity     Past Surgical History:  Procedure Laterality Date  . ABDOMINAL HYSTERECTOMY    . APPENDECTOMY     Age 87  . VESICOVAGINAL FISTULA CLOSURE W/ TAH       reports that she has quit smoking. Her smoking use included cigarettes. She has never used smokeless tobacco. She reports that she does not drink alcohol or use drugs.  No Known Allergies  Family History  Problem Relation Age of Onset  . Heart attack Mother   . Cancer Mother        Breast   . Alcohol abuse Father   . Heart attack Sister   . Diabetes Sister      Prior to Admission medications   Medication Sig Start Date End Date Taking? Authorizing Provider  aspirin EC  81 MG tablet Take 1 tablet (81 mg total) by mouth daily. 09/24/13   Fayrene Helper, MD  ciclopirox (LOPROX) 0.77 % cream Apply topically 2 (two) times daily. Apply to nailbed of left great toe only. 04/17/18   Marzetta Board, MD  hydrochlorothiazide (MICROZIDE) 12.5 MG capsule Take 1 capsule (12.5 mg total) by mouth daily. 04/14/18   Fayrene Helper, MD  metoprolol succinate (TOPROL-XL) 25 MG 24 hr tablet TAKE 1 TABLET BY MOUTH ONCE DAILY. 08/18/18   Fayrene Helper, MD  polyethylene glycol [facility]) packet Take 17 g by mouth daily. Patient taking differently: Take 17 g by mouth daily as needed.   09/24/16   Fredia Sorrow, MD  Vitamin D, Ergocalciferol, (DRISDOL) 50000 units CAPS capsule TAKE 1 CAPSULE BY MOUTH ONCE WEEKLY. 08/03/18   Fayrene Helper, MD    Physical Exam: Vitals:   09/04/18 1950 09/04/18 1955 09/04/18 2000 09/04/18 2014  BP:   (!) 149/78   Pulse: (!) 113 (!) 113 98 98  Resp: 19 (!) 22 (!) 21 20  Temp:      TempSrc:      SpO2: 91% 90% 93% 96%  Weight:        Constitutional: NAD, sleeping, easily woken  Eyes: PERTLA, lids and conjunctivae normal ENMT: Mucous membranes are moist. Posterior pharynx clear of any exudate or lesions.   Neck: normal, supple, no masses, no thyromegaly Respiratory: clear to auscultation bilaterally, no wheezing, no crackles. Normal respiratory effort.    Cardiovascular: S1 & S2 heard, regular rate and rhythm. No extremity edema.   Abdomen: No distension, no tenderness, soft. Bowel sounds active.   Musculoskeletal: no clubbing / cyanosis. No joint deformity upper and lower extremities.   Skin: no significant rashes, lesions, ulcers. Poor turgor. Neurologic: CN 2-12 grossly intact. Sensation intact. Generally weak and lethargic.  Psychiatric: Sleeping, easily woken and oriented to person only. Pleasant and cooperative.    Labs on Admission: I have personally reviewed following labs and imaging studies  CBC: Recent Labs  Lab 09/04/18 1816  WBC 11.4*  NEUTROABS 9.8*  HGB 13.6  HCT 44.3  MCV 85.0  PLT 226   Basic Metabolic Panel: Recent Labs  Lab 09/04/18 1816  NA 143  K 3.2*  CL 104  CO2 27  GLUCOSE 122*  BUN 27*  CREATININE 0.80  CALCIUM 9.7   GFR: Estimated Creatinine Clearance: 43.7 mL/min (by C-G formula based on SCr of 0.8 mg/dL). Liver Function Tests: Recent Labs  Lab 09/04/18 1816  AST 39  ALT 14  ALKPHOS 72  BILITOT 1.5*  PROT 8.1  ALBUMIN 4.1   No results for input(s): LIPASE, AMYLASE in the last 168 hours. No results for input(s): AMMONIA in the last 168 hours. Coagulation Profile: No  results for input(s): INR, PROTIME in the last 168 hours. Cardiac Enzymes: Recent Labs  Lab 09/04/18 1816  CKTOTAL 572*  TROPONINI <0.03   BNP (last 3 results) No results for input(s): PROBNP in the last 8760 hours. HbA1C: No results for input(s): HGBA1C in the last 72 hours. CBG: Recent Labs  Lab 09/04/18 1807  GLUCAP 107*   Lipid Profile: No results for input(s): CHOL, HDL, LDLCALC, TRIG, CHOLHDL, LDLDIRECT in the last 72 hours. Thyroid Function Tests: No results for input(s): TSH, T4TOTAL, FREET4, T3FREE, THYROIDAB in the last 72 hours. Anemia Panel: No results for input(s): VITAMINB12, FOLATE, FERRITIN, TIBC, IRON, RETICCTPCT in the last 72 hours. Urine analysis:    Component Value Date/Time  COLORURINE AMBER (A) 09/04/2018 1935   APPEARANCEUR CLEAR 09/04/2018 1935   LABSPEC 1.027 09/04/2018 1935   PHURINE 5.0 09/04/2018 1935   GLUCOSEU NEGATIVE 09/04/2018 1935   HGBUR SMALL (A) 09/04/2018 1935   HGBUR trace-lysed 01/08/2011 [place] 09/04/2018 1935   KETONESUR 5 (A) 09/04/2018 1935   PROTEINUR 100 (A) 09/04/2018 1935   UROBILINOGEN 1.0 01/08/2011 1122   NITRITE NEGATIVE 09/04/2018 1935   LEUKOCYTESUR NEGATIVE 09/04/2018 1935   Sepsis Labs: @LABRCNTIP (procalcitonin:4,lacticidven:4) )No results found for this or any previous visit (from the past 240 hour(s)).   Radiological Exams on Admission: Dg Chest 2 View  Result Date: 09/04/2018 CLINICAL DATA:  Found on for with altered level consciousness EXAM: CHEST - 2 VIEW COMPARISON:  01/27/2018 FINDINGS: Cardiac shadow is within normal limits. Aortic calcifications are noted. Lungs are well aerated bilaterally. Elevation the right hemidiaphragm is again seen. There is persistent central vascular congestion and bilateral airspace opacities seen. Slight improvement when compare with the prior study is noted. An acute on chronic infiltrative process could not be totally excluded. IMPRESSION: Patchy  airspace opacities bilaterally. Portion of these are chronic in nature. Acute on chronic infiltrate cannot be totally excluded. Electronically Signed   By: Inez Catalina M.D.   On: 09/04/2018 19:20   Dg Pelvis 1-2 Views  Result Date: 09/04/2018 CLINICAL DATA:  Found on floor with altered level of consciousness EXAM: PELVIS - 1-2 VIEW COMPARISON:  None. FINDINGS: No acute fracture or dislocation is noted. Degenerative changes of lumbar spine are noted. No soft tissue changes are seen. IMPRESSION: No acute abnormality noted. Electronically Signed   By: Inez Catalina M.D.   On: 09/04/2018 19:21   Ct Head Wo Contrast  Result Date: 09/04/2018 CLINICAL DATA:  Found on floor by family with generalized weakness EXAM: CT HEAD WITHOUT CONTRAST TECHNIQUE: Contiguous axial images were obtained from the base of the skull through the vertex without intravenous contrast. COMPARISON:  04/04/2007 FINDINGS: Brain: Mild atrophic changes and chronic white matter ischemic changes are seen. No findings to suggest acute hemorrhage, acute infarction or space-occupying mass lesion are noted. Vascular: No hyperdense vessel or unexpected calcification. Skull: Normal. Negative for fracture or focal lesion. Sinuses/Orbits: No acute finding. Other: None. IMPRESSION: Chronic atrophic and ischemic changes without acute abnormality. Electronically Signed   By: Inez Catalina M.D.   On: 09/04/2018 19:25    EKG: Independently reviewed. Sinus rhythm.   Assessment/Plan   1. Generalized weakness  - Patient has dementia, lives alone, was seen in usual state on 10/30, then found on floor just prior to arrival in ED  - She is generally weak and lethargic without focal deficit  - CK mildly elevated in ED, likely d/t dehydration and laying on floor with myopathy less likely  - Check ESR, TSH, B12, and folate levels - Continue IVF hydration  - Replace electrolytes  - PT eval and treatment   2. Dehydration  - Patient appears hypovolemic  and there is elevated BUN:Cr ration, elevated CK, and urine ketones noted  - She was given 1 liter NS in ED and will be continued on IVF hydration overnight    3. Hypertension  - BP elevated in ED and improved with Lopressor   - Continue Toprol as tolerated, hold HCTZ while hydrating    4. Hypokalemia  - Serum potassium is 3.2 on admission  - KCl added to IVF and 20 mEq oral potassium given  - Repeat chem panel in am  DVT prophylaxis: Lovenox  Code Status: Full  Family Communication: Family updated at bedside  Consults called: none Admission status: Observation     Vianne Bulls, MD [facility] Pager [phone]  If 7PM-7AM, please contact night-coverage [email or web address] Password [facility]  09/04/2018, 8:44 PM

## 2018-09-04 NOTE — ED Triage Notes (Signed)
Pt found laying in the floor by family and assisted back to chair. Pt reported that she felt like she had lead in feet and had difficulty standing with EMS. Pt lives alone and has caregiver and normally able to walk with walker

## 2018-09-05 DIAGNOSIS — I10 Essential (primary) hypertension: Secondary | ICD-10-CM | POA: Diagnosis not present

## 2018-09-05 DIAGNOSIS — G301 Alzheimer's disease with late onset: Secondary | ICD-10-CM | POA: Diagnosis not present

## 2018-09-05 DIAGNOSIS — E860 Dehydration: Secondary | ICD-10-CM | POA: Diagnosis not present

## 2018-09-05 DIAGNOSIS — E876 Hypokalemia: Secondary | ICD-10-CM | POA: Diagnosis not present

## 2018-09-05 DIAGNOSIS — I471 Supraventricular tachycardia: Secondary | ICD-10-CM | POA: Diagnosis present

## 2018-09-05 DIAGNOSIS — R531 Weakness: Secondary | ICD-10-CM | POA: Diagnosis not present

## 2018-09-05 LAB — CBC WITH DIFFERENTIAL/PLATELET
ABS IMMATURE GRANULOCYTES: 0.04 10*3/uL (ref 0.00–0.07)
BASOS ABS: 0 10*3/uL (ref 0.0–0.1)
BASOS PCT: 0 %
EOS ABS: 0.2 10*3/uL (ref 0.0–0.5)
Eosinophils Relative: 1 %
HCT: 41.9 % (ref 36.0–46.0)
Hemoglobin: 12.7 g/dL (ref 12.0–15.0)
IMMATURE GRANULOCYTES: 0 %
Lymphocytes Relative: 8 %
Lymphs Abs: 1 10*3/uL (ref 0.7–4.0)
MCH: 26.5 pg (ref 26.0–34.0)
MCHC: 30.3 g/dL (ref 30.0–36.0)
MCV: 87.5 fL (ref 80.0–100.0)
MONOS PCT: 8 %
Monocytes Absolute: 1 10*3/uL (ref 0.1–1.0)
NEUTROS ABS: 10.6 10*3/uL — AB (ref 1.7–7.7)
NEUTROS PCT: 83 %
NRBC: 0 % (ref 0.0–0.2)
PLATELETS: 227 10*3/uL (ref 150–400)
RBC: 4.79 MIL/uL (ref 3.87–5.11)
RDW: 15.5 % (ref 11.5–15.5)
WBC: 12.9 10*3/uL — AB (ref 4.0–10.5)

## 2018-09-05 LAB — TSH: TSH: 0.894 u[IU]/mL (ref 0.350–4.500)

## 2018-09-05 LAB — BASIC METABOLIC PANEL
ANION GAP: 8 (ref 5–15)
BUN: 20 mg/dL (ref 8–23)
CALCIUM: 9 mg/dL (ref 8.9–10.3)
CO2: 27 mmol/L (ref 22–32)
Chloride: 108 mmol/L (ref 98–111)
Creatinine, Ser: 0.58 mg/dL (ref 0.44–1.00)
Glucose, Bld: 99 mg/dL (ref 70–99)
POTASSIUM: 3.8 mmol/L (ref 3.5–5.1)
SODIUM: 143 mmol/L (ref 135–145)

## 2018-09-05 LAB — VITAMIN B12: Vitamin B-12: 1389 pg/mL — ABNORMAL HIGH (ref 180–914)

## 2018-09-05 LAB — MAGNESIUM: MAGNESIUM: 2 mg/dL (ref 1.7–2.4)

## 2018-09-05 LAB — CK: CK TOTAL: 473 U/L — AB (ref 38–234)

## 2018-09-05 MED ORDER — POTASSIUM CHLORIDE IN NACL 20-0.9 MEQ/L-% IV SOLN
INTRAVENOUS | Status: AC
  Administered 2018-09-05: 13:00:00 via INTRAVENOUS

## 2018-09-05 NOTE — Evaluation (Signed)
Physical Therapy Evaluation Patient Details Name: [name] MRN: [id number] DOB: [birth year] Today's Date: 09/05/2018   History of Present Illness  KAZZANDRA DESAULNIERS is a 84 y.o. female with medical history significant for dementia, hypertension, and CAD, now presenting to the emergency department after being found on the floor at home.  Patient lives alone with family checking on her 3 times a week, is able to ambulate with a walker at baseline, was seen in her usual state on 09/02/2018, and was then found on the floor this evening.  She told family that she felt like she had "lead in her feet," and was unable to get up from the floor.  She is unable to recall if she had fallen or how she got there and family reports that her current mental status is consistent with her baseline.  She reported some right hip pain in the ED, and later denied it.  She denies any shortness of breath, chest pain, abdominal pain, vomiting, or diarrhea.  She does not remember if she took her medications today or yesterday and does not remember how long she had been on the ground.  Clinical Impression  Pt received in bed with cousin, Elnita Maxwell, at bedside and was agreeable to PT evaluation. Pt admitted with above diagnosis. At baseline, pt lives alone, ambulates with RW mostly HH distances but can go limited community distances with assistance, requires intermittent assistance for dressing and bathing, and has personal car attendant 3x/week for 2 hours each day (MWF) with family Elnita Maxwell) that checks on her every day. Currently, pt functioning below baseline as she required mod - max A for bed mobility and mod - max A for STS with RW and was limited to a few short sidesteps at EOB due to fatigue and weakness. PT recommending SNF upon d/c in order to address her limitations in order to maximize strength, balance, and gait and promote return to PLOF. Will continue to follow acutely. Pt left in bed with bed alarm, call bell, and family  at bedside; RN made aware.    Follow Up Recommendations SNF    Equipment Recommendations  None recommended by PT    Recommendations for Other Services       Precautions / Restrictions Precautions Precautions: Fall Precaution Comments: gen weakness and recent fall  Restrictions Weight Bearing Restrictions: No      Mobility  Bed Mobility Overal bed mobility: Needs Assistance Bed Mobility: Supine to Sit;Sit to Supine     Supine to sit: Mod assist Sit to supine: Mod assist;Max assist      Transfers Overall transfer level: Needs assistance Equipment used: Rolling walker (2 wheeled) Transfers: Sit to/from Stand Sit to Stand: Mod assist;Max assist         General transfer comment: cues for hand placement  Ambulation/Gait Ambulation/Gait assistance: Mod assist Gait Distance (Feet): 2 Feet Assistive device: Rolling walker (2 wheeled) Gait Pattern/deviations: Decreased stride length   Gait velocity interpretation: <1.31 ft/sec, indicative of household ambulator General Gait Details: limited to few short side steps at EOB with RW due to weakness and fatigue  Stairs            Wheelchair Mobility    Modified Rankin (Stroke Patients Only)       Balance Overall balance assessment: Needs assistance;History of Falls Sitting-balance support: Feet supported Sitting balance-Leahy Scale: Fair Sitting balance - Comments: trunk lean to the L when sitting EOB, cues to correct   Standing balance support: Bilateral upper extremity supported  Standing balance-Leahy Scale: Poor                               Pertinent Vitals/Pain Pain Assessment: Faces Faces Pain Scale: Hurts a little bit Pain Location: R shoulder Pain Descriptors / Indicators: Sore Pain Intervention(s): Limited activity within patient's tolerance;Monitored during session;Repositioned    Home Living Family/patient expects to be discharged to:: Private residence Living Arrangements:  Alone Available Help at Discharge: Personal care attendant;Family;Available PRN/intermittently(personal care attendant MWF; family checks on her every day) Type of Home: House Home Access: Stairs to enter Entrance Stairs-Rails: Can reach both Entrance Stairs-Number of Steps: 4 Home Layout: One level Home Equipment: Walker - 2 wheels;Shower seat;Bedside commode      Prior Function Level of Independence: Independent with assistive device(s);Needs assistance   Gait / Transfers Assistance Needed: HH ambulator and limited community ambulator with RW; needs help with assistance when outside of house with RW  ADL's / Homemaking Assistance Needed: has caregivers 2 hours/day x 3 days/week; intermittnet assistance with dressing andbathing per pt's cousin, Elnita Maxwell; [name] checks on her every day        Hand Dominance        Extremity/Trunk Assessment   Upper Extremity Assessment Upper Extremity Assessment: Generalized weakness    Lower Extremity Assessment Lower Extremity Assessment: Generalized weakness    Cervical / Trunk Assessment Cervical / Trunk Assessment: Kyphotic  Communication   Communication: No difficulties  Cognition Arousal/Alertness: Awake/alert Behavior During Therapy: WFL for tasks assessed/performed Overall Cognitive Status: History of cognitive impairments - at baseline                                 General Comments: not sure why she came to the hospital but knew she wasin the hospital      General Comments      Exercises     Assessment/Plan    PT Assessment Patient needs continued PT services  PT Problem List Decreased strength;Decreased activity tolerance;Decreased balance;Decreased mobility;Decreased cognition;Decreased safety awareness       PT Treatment Interventions Gait training;Functional mobility training;Therapeutic activities;Therapeutic exercise;Balance training;Neuromuscular re-education;Patient/family education    PT  Goals (Current goals can be found in the Care Plan section)  Acute Rehab PT Goals Patient Stated Goal: to get stronger to go home PT Goal Formulation: With patient/family Time For Goal Achievement: 09/12/18 Potential to Achieve Goals: Good    Frequency Min 3X/week   Barriers to discharge Decreased caregiver support      Co-evaluation               AM-PAC PT "6 Clicks" Daily Activity  Outcome Measure Difficulty turning over in bed (including adjusting bedclothes, sheets and blankets)?: Unable Difficulty moving from lying on back to sitting on the side of the bed? : Unable Difficulty sitting down on and standing up from a chair with arms (e.g., wheelchair, bedside commode, etc,.)?: Unable Help needed moving to and from a bed to chair (including a wheelchair)?: A Lot Help needed walking in hospital room?: A Lot Help needed climbing 3-5 steps with a railing? : Total 6 Click Score: 8    End of Session Equipment Utilized During Treatment: Gait belt Activity Tolerance: Patient tolerated treatment well;Patient limited by fatigue Patient left: in bed;with call bell/phone within reach;with bed alarm set;with family/visitor present Nurse Communication: Mobility status PT Visit Diagnosis: Unsteadiness on feet (R26.81);History of  falling (Z91.81);Muscle weakness (generalized) (M62.81);Difficulty in walking, not elsewhere classified (R26.2)    Time: 1610-9604 PT Time Calculation (min) (ACUTE ONLY): 32 min   Charges:   PT Evaluation $PT Eval Low Complexity: 1 Low PT Treatments $Therapeutic Exercise: 8-22 mins           Jac Canavan PT, DPT

## 2018-09-05 NOTE — Plan of Care (Signed)
  Problem: Acute Rehab PT Goals(only PT should resolve) Goal: Pt Will Go Supine/Side To Sit Flowsheets (Taken 09/05/2018 1005) Pt will go Supine/Side to Sit: with minimal assist Goal: Pt Will Go Sit To Supine/Side Flowsheets (Taken 09/05/2018 1005) Pt will go Sit to Supine/Side: with minimal assist Goal: Patient Will Transfer Sit To/From Stand Flowsheets (Taken 09/05/2018 1005) Patient will transfer sit to/from stand: with minimal assist Goal: Pt Will Transfer Bed To Chair/Chair To Bed Flowsheets (Taken 09/05/2018 1005) Pt will Transfer Bed to Chair/Chair to Bed: with min assist Goal: Pt Will Ambulate Flowsheets (Taken 09/05/2018 1005) Pt will Ambulate: 25 feet; with minimal assist; with rolling walker    Jac Canavan PT, DPT

## 2018-09-05 NOTE — Progress Notes (Signed)
PROGRESS NOTE    LIBERTIE HAUSLER  ZOX:[id number] DOB: [birth year] DOA: 09/04/2018 PCP: Kerri Perches, MD    Brief Narrative:  84 year old female with a history of dementia, hypertension, was brought to the hospital after she was found on the floor at home.  Patient lives alone and family checks in on her.  She complained of right hip pain, but imaging did not indicate any acute findings.  She was noted to be clinically dehydrated and generally weak.  She was admitted for further evaluation.   Assessment & Plan:   Principal Problem:   Generalized weakness Active Problems:   Dementia (HCC)   Essential hypertension, benign   Hypokalemia   Dehydration   SVT (supraventricular tachycardia) (HCC)   1. Generalized weakness.  Felt to be related to dehydration and deconditioning.  Seen by physical therapy recommended skilled nursing facility placement.  TSH is normal, B12 is elevated 2. Supraventricular tachycardia.  Patient briefly had an episode overnight of SVT with heart rate at 150.  She is been restarted on her oral Toprol.  Continue to monitor on telemetry. 3. Hypertension.  Blood pressure currently stable. 4. Dementia.  Stable.  He continues to have hypokalemia.  Replaced   DVT prophylaxis: Lovenox Code Status: Full code Family Communication: No family present Disposition Plan: Seen by physical therapy recommended skilled nursing facility placement   Consultants:     Procedures:     Antimicrobials:      Subjective: She knows she is in the hospital.  Does not recall events that led to her coming to the hospital.  Does not remember falling.  This is apparently consistent with her baseline  Objective: Vitals:   09/04/18 2135 09/04/18 2201 09/05/18 0614 09/05/18 1458  BP:  (!) 163/87 (!) 145/59 (!) 144/50  Pulse:  94 (!) 101 76  Resp:  18 18 18   Temp: 97.7 F (36.5 C) 98.2 F (36.8 C) 98.5 F (36.9 C)   TempSrc: Oral Oral Oral   SpO2:  93% 96% 96%  Weight:   72.3 kg    Height:  5\' 3"  (1.6 m)      Intake/Output Summary (Last 24 hours) at 09/05/2018 1737 Last data filed at 09/05/2018 8119 Gross per 24 hour  Intake 2463.75 ml  Output 1 ml  Net 2462.75 ml   Filed Weights   09/04/18 1802 09/04/18 2201  Weight: 79.7 kg 72.3 kg    Examination:  General exam: Appears calm and comfortable  Respiratory system: Clear to auscultation. Respiratory effort normal. Cardiovascular system: S1 & S2 heard, RRR. No JVD, murmurs, rubs, gallops or clicks. No pedal edema. Gastrointestinal system: Abdomen is nondistended, soft and nontender. No organomegaly or masses felt. Normal bowel sounds heard. Central nervous system: Alert and oriented. No focal neurological deficits. Extremities: Symmetric 5 x 5 power. Skin: No rashes, lesions or ulcers Psychiatry: Judgement and insight appear normal. Mood & affect appropriate.     Data Reviewed: I have personally reviewed following labs and imaging studies  CBC: Recent Labs  Lab 09/04/18 1816 09/05/18 0627  WBC 11.4* 12.9*  NEUTROABS 9.8* 10.6*  HGB 13.6 12.7  HCT 44.3 41.9  MCV 85.0 87.5  PLT 286 227   Basic Metabolic Panel: Recent Labs  Lab 09/04/18 1816 09/05/18 0627  NA 143 143  K 3.2* 3.8  CL 104 108  CO2 27 27  GLUCOSE 122* 99  BUN 27* 20  CREATININE 0.80 0.58  CALCIUM 9.7 9.0  MG  --  2.0  GFR: Estimated Creatinine Clearance: 44.6 mL/min (by C-G formula based on SCr of 0.58 mg/dL). Liver Function Tests: Recent Labs  Lab 09/04/18 1816  AST 39  ALT 14  ALKPHOS 72  BILITOT 1.5*  PROT 8.1  ALBUMIN 4.1   No results for input(s): LIPASE, AMYLASE in the last 168 hours. No results for input(s): AMMONIA in the last 168 hours. Coagulation Profile: No results for input(s): INR, PROTIME in the last 168 hours. Cardiac Enzymes: Recent Labs  Lab 09/04/18 1816 09/05/18 0627  CKTOTAL 572* 473*  TROPONINI <0.03  --    BNP (last 3 results) No results for input(s): PROBNP in the last  8760 hours. HbA1C: No results for input(s): HGBA1C in the last 72 hours. CBG: Recent Labs  Lab 09/04/18 1807  GLUCAP 107*   Lipid Profile: No results for input(s): CHOL, HDL, LDLCALC, TRIG, CHOLHDL, LDLDIRECT in the last 72 hours. Thyroid Function Tests: Recent Labs    09/05/18 1012  TSH 0.894   Anemia Panel: Recent Labs    09/05/18 1012  VITAMINB12 1,389*   Sepsis Labs: No results for input(s): PROCALCITON, LATICACIDVEN in the last 168 hours.  No results found for this or any previous visit (from the past 240 hour(s)).       Radiology Studies: Dg Chest 2 View  Result Date: 09/04/2018 CLINICAL DATA:  Found on for with altered level consciousness EXAM: CHEST - 2 VIEW COMPARISON:  01/27/2018 FINDINGS: Cardiac shadow is within normal limits. Aortic calcifications are noted. Lungs are well aerated bilaterally. Elevation the right hemidiaphragm is again seen. There is persistent central vascular congestion and bilateral airspace opacities seen. Slight improvement when compare with the prior study is noted. An acute on chronic infiltrative process could not be totally excluded. IMPRESSION: Patchy airspace opacities bilaterally. Portion of these are chronic in nature. Acute on chronic infiltrate cannot be totally excluded. Electronically Signed   By: Alcide Clever M.D.   On: 09/04/2018 19:20   Dg Pelvis 1-2 Views  Result Date: 09/04/2018 CLINICAL DATA:  Found on floor with altered level of consciousness EXAM: PELVIS - 1-2 VIEW COMPARISON:  None. FINDINGS: No acute fracture or dislocation is noted. Degenerative changes of lumbar spine are noted. No soft tissue changes are seen. IMPRESSION: No acute abnormality noted. Electronically Signed   By: Alcide Clever M.D.   On: 09/04/2018 19:21   Ct Head Wo Contrast  Result Date: 09/04/2018 CLINICAL DATA:  Found on floor by family with generalized weakness EXAM: CT HEAD WITHOUT CONTRAST TECHNIQUE: Contiguous axial images were obtained from  the base of the skull through the vertex without intravenous contrast. COMPARISON:  04/04/2007 FINDINGS: Brain: Mild atrophic changes and chronic white matter ischemic changes are seen. No findings to suggest acute hemorrhage, acute infarction or space-occupying mass lesion are noted. Vascular: No hyperdense vessel or unexpected calcification. Skull: Normal. Negative for fracture or focal lesion. Sinuses/Orbits: No acute finding. Other: None. IMPRESSION: Chronic atrophic and ischemic changes without acute abnormality. Electronically Signed   By: Alcide Clever M.D.   On: 09/04/2018 19:25   Ct Hip Right Wo Contrast  Result Date: 09/04/2018 CLINICAL DATA:  Recent fall with hip pain, initial encounter EXAM: CT OF THE RIGHT HIP WITHOUT CONTRAST TECHNIQUE: Multidetector CT imaging of the right hip was performed according to the standard protocol. Multiplanar CT image reconstructions were also generated. COMPARISON:  Plain film from earlier in the same day. FINDINGS: Bones/Joint/Cartilage Bony structures are well visualized. No findings to suggest acute fracture are seen. Degenerative  changes of the sacroiliac joints and lower lumbar spine are noted. No definitive joint effusion is seen. Ligaments Suboptimally assessed by CT. Muscles and Tendons Visualized muscular structures are within normal limits. Soft tissues Surrounding soft tissues demonstrate no focal hematoma. A fat containing anterior abdominal wall hernia is noted to the right of the umbilicus. The pelvic structures as visualized are within normal limits. IMPRESSION: No evidence of acute right femoral fracture. No other acute abnormality is seen. Electronically Signed   By: Alcide Clever M.D.   On: 09/04/2018 21:27        Scheduled Meds: . aspirin EC  81 mg Oral Daily  . enoxaparin (LOVENOX) injection  40 mg Subcutaneous Q24H  . metoprolol succinate  25 mg Oral Daily  . sodium chloride flush  3 mL Intravenous Q12H   Continuous Infusions: . 0.9 %  NaCl with KCl 20 mEq / L 90 mL/hr at 09/05/18 1243     LOS: 0 days    Time spent:    Erick Blinks, MD [facility] Pager [phone]  If 7PM-7AM, please contact night-coverage [email or web address] Password [facility] 09/05/2018, 5:37 PM

## 2018-09-06 DIAGNOSIS — I10 Essential (primary) hypertension: Secondary | ICD-10-CM | POA: Diagnosis not present

## 2018-09-06 DIAGNOSIS — G301 Alzheimer's disease with late onset: Secondary | ICD-10-CM

## 2018-09-06 DIAGNOSIS — E860 Dehydration: Secondary | ICD-10-CM | POA: Diagnosis not present

## 2018-09-06 DIAGNOSIS — R531 Weakness: Secondary | ICD-10-CM | POA: Diagnosis not present

## 2018-09-06 DIAGNOSIS — R262 Difficulty in walking, not elsewhere classified: Secondary | ICD-10-CM

## 2018-09-06 DIAGNOSIS — I471 Supraventricular tachycardia: Secondary | ICD-10-CM

## 2018-09-06 DIAGNOSIS — E876 Hypokalemia: Secondary | ICD-10-CM

## 2018-09-06 DIAGNOSIS — F0280 Dementia in other diseases classified elsewhere without behavioral disturbance: Secondary | ICD-10-CM

## 2018-09-06 LAB — CBC
HCT: 38.5 % (ref 36.0–46.0)
HEMOGLOBIN: 11.5 g/dL — AB (ref 12.0–15.0)
MCH: 25.8 pg — ABNORMAL LOW (ref 26.0–34.0)
MCHC: 29.9 g/dL — AB (ref 30.0–36.0)
MCV: 86.5 fL (ref 80.0–100.0)
Platelets: 219 10*3/uL (ref 150–400)
RBC: 4.45 MIL/uL (ref 3.87–5.11)
RDW: 15.7 % — ABNORMAL HIGH (ref 11.5–15.5)
WBC: 8 10*3/uL (ref 4.0–10.5)
nRBC: 0 % (ref 0.0–0.2)

## 2018-09-06 LAB — BASIC METABOLIC PANEL
Anion gap: 5 (ref 5–15)
BUN: 17 mg/dL (ref 8–23)
CHLORIDE: 112 mmol/L — AB (ref 98–111)
CO2: 25 mmol/L (ref 22–32)
Calcium: 8.6 mg/dL — ABNORMAL LOW (ref 8.9–10.3)
Creatinine, Ser: 0.61 mg/dL (ref 0.44–1.00)
GFR calc Af Amer: >60 mL/min (ref >60)
GFR calc non Af Amer: >60 mL/min (ref >60)
GLUCOSE: 87 mg/dL (ref 70–99)
POTASSIUM: 4.1 mmol/L (ref 3.5–5.1)
Sodium: 142 mmol/L (ref 135–145)

## 2018-09-06 NOTE — Progress Notes (Signed)
PROGRESS NOTE    [name]  ZOX:[id number] DOB: Mar 25, 1926 DOA: 09/04/2018 PCP: Kerri Perches, MD     Brief Narrative:  84 year old female with a history of dementia, hypertension, was brought to the hospital after she was found on the floor at home.  Patient lives alone and family checks in on her.  She complained of right hip pain, but imaging did not indicate any acute fractures.  She was noted to be clinically dehydrated and generally weak.  She was admitted for further evaluation.   Assessment & Plan: 1-Generalized weakness -Felt to be related with dehydration and deconditioning -Physical therapy has seen patient and is recommending a skilled nursing facility placement -TSH normal, B12 elevated -Patient responded appropriately to fluid resuscitation and is eating and drinking now.  2-Dementia -Unknown baseline -patient oriented X2 currently -she is unable to look after herself and performed all activities of daily living.  3-Essential hypertension, benign -appears to be compensated -will continue metoprolol   4-Hypokalemia -repleted and WNL now -encourage adequate hydration and PO intake.  5-Dehydration -resolved with IVF resuscitation -encourage to maintain good oral hydration now  6-SVT (supraventricular tachycardia) (HCC) -resolved after electrolytes repletion, fluid resuscitation and resumption of home beta-blockers.  -Patient with no abnormalities over 24 hours of telemetry monitoring. -We will DC telemetry.  DVT prophylaxis: Lovenox Code Status: Full code Family Communication: No family at bedside Disposition Plan: Patient seen by physical therapy who has recommended a skilled nursing facility placement.  Clinical social worker has been made aware.  Consultants:   None  Procedures:   See below for x-ray reports.  Antimicrobials:  Anti-infectives (From admission, onward)   None      Subjective: Afebrile, no nausea, no vomiting, no  chest pain.  Patient reports feeling weak, otherwise in not having acute complaints.  Objective: Vitals:   09/05/18 1458 09/05/18 2214 09/06/18 0533 09/06/18 1047  BP: (!) 144/50 (!) 154/52 (!) 146/57   Pulse: 76 67 64 78  Resp: 18 16 20    Temp:  98.5 F (36.9 C) 98.8 F (37.1 C)   TempSrc:  Oral Oral   SpO2: 96% 98% 99%   Weight:      Height:        Intake/Output Summary (Last 24 hours) at 09/06/2018 1423 Last data filed at 09/06/2018 1003 Gross per 24 hour  Intake 737.81 ml  Output 500 ml  Net 237.81 ml   Filed Weights   09/04/18 1802 09/04/18 2201  Weight: 79.7 kg 72.3 kg    Examination:  General exam: Alert, awake, oriented x 2; denies chest pain, no nausea, no vomiting and except for feeling weak, expressed no acute complaints. Respiratory system: Clear to auscultation. Respiratory effort normal. Cardiovascular system:RRR. No murmurs, rubs, gallops. Gastrointestinal system: Abdomen is nondistended, soft and nontender. No organomegaly or masses felt. Normal bowel sounds heard. Central nervous system: Alert and oriented X2 (only forgot time).. No focal neurological deficits. Extremities: No cyanosis, no clubbing, no edema.   Skin: No rashes, no petechiae, no lesions or ulcers Psychiatry: Judgement and insight appear normal. Mood & affect appropriate.    Data Reviewed: I have personally reviewed following labs and imaging studies  CBC: Recent Labs  Lab 09/04/18 1816 09/05/18 0627 09/06/18 0646  WBC 11.4* 12.9* 8.0  NEUTROABS 9.8* 10.6*  --   HGB 13.6 12.7 11.5*  HCT 44.3 41.9 38.5  MCV 85.0 87.5 86.5  PLT 286 227 219   Basic Metabolic Panel: Recent Labs  Lab 09/04/18  1816 09/05/18 0627 09/06/18 0646  NA 143 143 142  K 3.2* 3.8 4.1  CL 104 108 112*  CO2 27 27 25   GLUCOSE 122* 99 87  BUN 27* 20 17  CREATININE 0.80 0.58 0.61  CALCIUM 9.7 9.0 8.6*  MG  --  2.0  --    GFR: Estimated Creatinine Clearance: 44.6 mL/min (by C-G formula based on SCr of  0.61 mg/dL).   Liver Function Tests: Recent Labs  Lab 09/04/18 1816  AST 39  ALT 14  ALKPHOS 72  BILITOT 1.5*  PROT 8.1  ALBUMIN 4.1   Cardiac Enzymes: Recent Labs  Lab 09/04/18 1816 09/05/18 0627  CKTOTAL 572* 473*  TROPONINI <0.03  --    CBG: Recent Labs  Lab 09/04/18 1807  GLUCAP 107*   Thyroid Function Tests: Recent Labs    09/05/18 1012  TSH 0.894   Anemia Panel: Recent Labs    09/05/18 1012  VITAMINB12 1,389*   Urine analysis:    Component Value Date/Time   COLORURINE AMBER (A) 09/04/2018 1935   APPEARANCEUR CLEAR 09/04/2018 1935   LABSPEC 1.027 09/04/2018 1935   PHURINE 5.0 09/04/2018 1935   GLUCOSEU NEGATIVE 09/04/2018 1935   HGBUR SMALL (A) 09/04/2018 1935   HGBUR trace-lysed 01/08/2011 1122   BILIRUBINUR NEGATIVE 09/04/2018 1935   KETONESUR 5 (A) 09/04/2018 1935   PROTEINUR 100 (A) 09/04/2018 1935   UROBILINOGEN 1.0 01/08/2011 1122   NITRITE NEGATIVE 09/04/2018 1935   LEUKOCYTESUR NEGATIVE 09/04/2018 1935    Radiology Studies: Dg Chest 2 View  Result Date: 09/04/2018 CLINICAL DATA:  Found on for with altered level consciousness EXAM: CHEST - 2 VIEW COMPARISON:  01/27/2018 FINDINGS: Cardiac shadow is within normal limits. Aortic calcifications are noted. Lungs are well aerated bilaterally. Elevation the right hemidiaphragm is again seen. There is persistent central vascular congestion and bilateral airspace opacities seen. Slight improvement when compare with the prior study is noted. An acute on chronic infiltrative process could not be totally excluded. IMPRESSION: Patchy airspace opacities bilaterally. Portion of these are chronic in nature. Acute on chronic infiltrate cannot be totally excluded. Electronically Signed   By: Alcide Clever M.D.   On: 09/04/2018 19:20   Dg Pelvis 1-2 Views  Result Date: 09/04/2018 CLINICAL DATA:  Found on floor with altered level of consciousness EXAM: PELVIS - 1-2 VIEW COMPARISON:  None. FINDINGS: No acute  fracture or dislocation is noted. Degenerative changes of lumbar spine are noted. No soft tissue changes are seen. IMPRESSION: No acute abnormality noted. Electronically Signed   By: Alcide Clever M.D.   On: 09/04/2018 19:21   Ct Head Wo Contrast  Result Date: 09/04/2018 CLINICAL DATA:  Found on floor by family with generalized weakness EXAM: CT HEAD WITHOUT CONTRAST TECHNIQUE: Contiguous axial images were obtained from the base of the skull through the vertex without intravenous contrast. COMPARISON:  04/04/2007 FINDINGS: Brain: Mild atrophic changes and chronic white matter ischemic changes are seen. No findings to suggest acute hemorrhage, acute infarction or space-occupying mass lesion are noted. Vascular: No hyperdense vessel or unexpected calcification. Skull: Normal. Negative for fracture or focal lesion. Sinuses/Orbits: No acute finding. Other: None. IMPRESSION: Chronic atrophic and ischemic changes without acute abnormality. Electronically Signed   By: Alcide Clever M.D.   On: 09/04/2018 19:25   Ct Hip Right Wo Contrast  Result Date: 09/04/2018 CLINICAL DATA:  Recent fall with hip pain, initial encounter EXAM: CT OF THE RIGHT HIP WITHOUT CONTRAST TECHNIQUE: Multidetector CT imaging of the right  hip was performed according to the standard protocol. Multiplanar CT image reconstructions were also generated. COMPARISON:  Plain film from earlier in the same day. FINDINGS: Bones/Joint/Cartilage Bony structures are well visualized. No findings to suggest acute fracture are seen. Degenerative changes of the sacroiliac joints and lower lumbar spine are noted. No definitive joint effusion is seen. Ligaments Suboptimally assessed by CT. Muscles and Tendons Visualized muscular structures are within normal limits. Soft tissues Surrounding soft tissues demonstrate no focal hematoma. A fat containing anterior abdominal wall hernia is noted to the right of the umbilicus. The pelvic structures as visualized are within  normal limits. IMPRESSION: No evidence of acute right femoral fracture. No other acute abnormality is seen. Electronically Signed   By: Alcide Clever M.D.   On: 09/04/2018 21:27    Scheduled Meds: . aspirin EC  81 mg Oral Daily  . enoxaparin (LOVENOX) injection  40 mg Subcutaneous Q24H  . metoprolol succinate  25 mg Oral Daily  . sodium chloride flush  3 mL Intravenous Q12H   Continuous Infusions:   LOS: 0 days    Time spent: 30 minutes    Vassie Loll, MD [facility] Pager [phone]  If 7PM-7AM, please contact night-coverage [email or web address] Password [email or web address] 09/06/2018, 2:23 PM

## 2018-09-06 NOTE — Progress Notes (Signed)
Received verbal OK from midlevel provider to leave peripheral IV out as pt is not receiving IV medication and pending discharge.

## 2018-09-07 DIAGNOSIS — I10 Essential (primary) hypertension: Secondary | ICD-10-CM | POA: Diagnosis not present

## 2018-09-07 DIAGNOSIS — L8942 Pressure ulcer of contiguous site of back, buttock and hip, stage 2: Secondary | ICD-10-CM

## 2018-09-07 DIAGNOSIS — E860 Dehydration: Secondary | ICD-10-CM | POA: Diagnosis not present

## 2018-09-07 DIAGNOSIS — R531 Weakness: Secondary | ICD-10-CM | POA: Diagnosis not present

## 2018-09-07 DIAGNOSIS — G301 Alzheimer's disease with late onset: Secondary | ICD-10-CM | POA: Diagnosis not present

## 2018-09-07 LAB — FOLATE RBC
FOLATE, HEMOLYSATE: 470.8 ng/mL
Folate, RBC: 1171 ng/mL (ref >498)
Hematocrit: 40.2 % (ref 34.0–46.6)

## 2018-09-07 MED ORDER — HALOPERIDOL LACTATE 5 MG/ML IJ SOLN
5.0000 mg | Freq: Once | INTRAMUSCULAR | Status: AC
  Administered 2018-09-07: 5 mg via INTRAMUSCULAR
  Filled 2018-09-07: qty 1

## 2018-09-07 NOTE — Progress Notes (Signed)
PROGRESS NOTE    [name]  BJY:[id number] DOB: 02-19-1926 DOA: 09/04/2018 PCP: Kerri Perches, MD     Brief Narrative:  84 year old female with a history of dementia, hypertension, was brought to the hospital after she was found on the floor at home.  Patient lives alone and family checks in on her.  She complained of right hip pain, but imaging did not indicate any acute fractures.  She was noted to be clinically dehydrated and generally weak.  She was admitted for further evaluation.   Assessment & Plan: 1-Generalized weakness -Felt to be related with dehydration and deconditioning -Physical therapy has seen patient and is recommending a skilled nursing facility placement -TSH normal, B12 elevated -Patient responded appropriately to fluid resuscitation and is eating and drinking now.  2-Dementia -Unknown baseline -patient oriented X2 currently -she is unable to look after herself and performed all activities of daily living.  3-Essential hypertension, benign -appears to be compensated -will continue metoprolol   4-Hypokalemia -repleted and WNL now -encourage adequate hydration and PO intake.  5-Dehydration -resolved with IVF resuscitation -encourage to maintain good oral hydration now  6-SVT (supraventricular tachycardia) (HCC) -resolved after electrolytes repletion, fluid resuscitation and resumption of home beta-blockers.  -Telemetry discontinue on 09/06/2018.  7-stage 1-2 pressure injury -continue prevention measures and constant repositioning   DVT prophylaxis: Lovenox Code Status: Full code Family Communication: No family at bedside Disposition Plan: Patient seen by physical therapy who has recommended a skilled nursing facility placement.  Clinical social worker has been made aware.  Consultants:   None  Procedures:   See below for x-ray reports.  Antimicrobials:  Anti-infectives (From admission, onward)   None      Subjective: No  fever, no nausea, no vomiting, no chest pain.  Patient reports feeling weak but overall not having any acute complaints.  She is tolerating diet.  Objective: Vitals:   09/06/18 2053 09/07/18 0525 09/07/18 0947 09/07/18 1542  BP: (!) 154/57 (!) 175/78  (!) 154/59  Pulse: (!) 49 82  76  Resp: 20 20  16   Temp: 98.5 F (36.9 C) 98.3 F (36.8 C)  98.3 F (36.8 C)  TempSrc: Oral Oral  Oral  SpO2: 97% 96% 97% 95%  Weight:      Height:        Intake/Output Summary (Last 24 hours) at 09/07/2018 1722 Last data filed at 09/07/2018 1300 Gross per 24 hour  Intake 840 ml  Output 1550 ml  Net -710 ml   Filed Weights   09/04/18 1802 09/04/18 2201  Weight: 79.7 kg 72.3 kg    Examination: General exam: Alert, awake, oriented x 2; no acute distress.  Patient sitting on the edge of the bed answering questions appropriately and able to tolerate diet. Respiratory system: Clear to auscultation. Respiratory effort normal. Cardiovascular system:RRR. No murmurs, rubs, gallops. Gastrointestinal system: Abdomen is nondistended, soft and nontender. No organomegaly or masses felt. Normal bowel sounds heard. Central nervous system: Alert and oriented. No focal neurological deficits. Extremities: No C/C/E, +pedal pulses Skin: No rashes, no petechiae; after assessing with nursing staff, stage 1-2 pressure injury appreciated on her sacrum area. Unsure, but will presume Injury present on admission. Psychiatry: Judgement and insight appear normal. Mood & affect appropriate.   Data Reviewed: I have personally reviewed following labs and imaging studies  CBC: Recent Labs  Lab 09/04/18 1816 09/05/18 0627 09/06/18 0646  WBC 11.4* 12.9* 8.0  NEUTROABS 9.8* 10.6*  --   HGB 13.6 12.7 11.5*  HCT 44.3 41.9  40.2 38.5  MCV 85.0 87.5 86.5  PLT 286 227 219   Basic Metabolic Panel: Recent Labs  Lab 09/04/18 1816 09/05/18 0627 09/06/18 0646  NA 143 143 142  K 3.2* 3.8 4.1  CL 104 108 112*  CO2 27 27 25     GLUCOSE 122* 99 87  BUN 27* 20 17  CREATININE 0.80 0.58 0.61  CALCIUM 9.7 9.0 8.6*  MG  --  2.0  --    GFR: Estimated Creatinine Clearance: 44.6 mL/min (by C-G formula based on SCr of 0.61 mg/dL).   Liver Function Tests: Recent Labs  Lab 09/04/18 1816  AST 39  ALT 14  ALKPHOS 72  BILITOT 1.5*  PROT 8.1  ALBUMIN 4.1   Cardiac Enzymes: Recent Labs  Lab 09/04/18 1816 09/05/18 0627  CKTOTAL 572* 473*  TROPONINI <0.03  --    CBG: Recent Labs  Lab 09/04/18 1807  GLUCAP 107*   Thyroid Function Tests: Recent Labs    09/05/18 1012  TSH 0.894   Anemia Panel: Recent Labs    09/05/18 1012  VITAMINB12 1,389*   Urine analysis:    Component Value Date/Time   COLORURINE AMBER (A) 09/04/2018 1935   APPEARANCEUR CLEAR 09/04/2018 1935   LABSPEC 1.027 09/04/2018 1935   PHURINE 5.0 09/04/2018 1935   GLUCOSEU NEGATIVE 09/04/2018 1935   HGBUR SMALL (A) 09/04/2018 1935   HGBUR trace-lysed 01/08/2011 1122   BILIRUBINUR NEGATIVE 09/04/2018 1935   KETONESUR 5 (A) 09/04/2018 1935   PROTEINUR 100 (A) 09/04/2018 1935   UROBILINOGEN 1.0 01/08/2011 1122   NITRITE NEGATIVE 09/04/2018 1935   LEUKOCYTESUR NEGATIVE 09/04/2018 1935    Radiology Studies: No results found.  Scheduled Meds: . aspirin EC  81 mg Oral Daily  . enoxaparin (LOVENOX) injection  40 mg Subcutaneous Q24H  . metoprolol succinate  25 mg Oral Daily  . sodium chloride flush  3 mL Intravenous Q12H   Continuous Infusions:   LOS: 0 days    Time spent: 30 minutes    Vassie Loll, MD [facility] Pager [phone]  If 7PM-7AM, please contact night-coverage [email or web address] Password [email or web address] 09/07/2018, 5:22 PM

## 2018-09-07 NOTE — Clinical Social Work Placement (Signed)
   CLINICAL SOCIAL WORK PLACEMENT  NOTE  Date:  09/07/2018  Patient Details  Name: [name] MRN: [id number] Date of Birth: 1926-06-20  Clinical Social Work is seeking post-discharge placement for this patient at the Skilled  Nursing Facility level of care (*CSW will initial, date and re-position this form in  chart as items are completed):  Yes   Patient/family provided with [facility] Clinical Social Work Department's list of facilities offering this level of care within the geographic area requested by the patient (or if unable, by the patient's family).  Yes   Patient/family informed of their freedom to choose among providers that offer the needed level of care, that participate in Medicare, Medicaid or managed care program needed by the patient, have an available bed and are willing to accept the patient.  Yes   Patient/family informed of [facility]'s ownership interest in [facility] and [facility], as well as of the fact that they are under no obligation to receive care at these facilities.  PASRR submitted to EDS on 09/07/18     PASRR number received on 09/07/18     Existing PASRR number confirmed on       FL2 transmitted to all facilities in geographic area requested by pt/family on 09/07/18     FL2 transmitted to all facilities within larger geographic area on       Patient informed that his/her managed care company has contracts with or will negotiate with certain facilities, including the following:            Patient/family informed of bed offers received.  Patient chooses bed at       Physician recommends and patient chooses bed at      Patient to be transferred to   on  .  Patient to be transferred to facility by       Patient family notified on   of transfer.  Name of family member notified:        PHYSICIAN       Additional Comment: Referral sent [facility] facilities, per pt  request.   _______________________________________________ Ida Rogue, LCSW 09/07/2018, 3:35 PM

## 2018-09-07 NOTE — Clinical Social Work Note (Signed)
Clinical Social Work Assessment  Patient Details  Name: [name] MRN: [id number] Date of Birth: 12/14/1925  Date of referral:  09/06/18               Reason for consult:  Facility Placement, Discharge Planning                Permission sought to share information with:  Family Supports Permission granted to share information::  Yes, Verbal Permission Granted  Name::     Harlow Asa, relative, [phone]  Agency::     Relationship::     Contact Information:     Housing/Transportation Living arrangements for the past 2 months:  Single Family Home Source of Information:  Other (Comment Required), Patient(relative) Patient Interpreter Needed:  None Criminal Activity/Legal Involvement Pertinent to Current Situation/Hospitalization:  No - Comment as needed Significant Relationships:  Other Family Members Lives with:  Self Do you feel safe going back to the place where you live?  Yes Need for family participation in patient care:  Yes (Comment)  Care giving concerns:  Pt lives alone.  Family has been checking on her daily.  Are needing help with her going to rehab to regain strength and ADL's   Social Worker assessment / plan:  Spoke with patient and relative who are both in agreement that patient needs inpatient rehab.  They are open to any placement in [facility] currently.  Relative stated she would take the list home and consult with other family members.  Employment status:  Retired Database administrator PT Recommendations:  Skilled Nursing Facility Information / Referral to community resources:  Skilled Nursing Facility  Patient/Family's Response to care:  Welcoming of care  Patient/Family's Understanding of and Emotional Response to Diagnosis, Current Treatment, and Prognosis:  Both patient and family are in full understanding.  Emotional Assessment Appearance:  Appears stated age Attitude/Demeanor/Rapport:  Gracious, Engaged Affect  (typically observed):    Orientation:  Oriented to Self, Oriented to Place, Oriented to  Time, Oriented to Situation Alcohol / Substance use:  Not Applicable Psych involvement (Current and /or in the community):  No (Comment)  Discharge Needs  Concerns to be addressed:  Discharge Planning Concerns Readmission within the last 30 days:  No Current discharge risk:  Lives alone Barriers to Discharge:  No SNF bed   Ida Rogue, LCSW 09/07/2018, 3:28 PM

## 2018-09-07 NOTE — NC FL2 (Signed)
[place] MEDICAID FL2 LEVEL OF CARE SCREENING TOOL     IDENTIFICATION  Patient Name: [name] Birthdate: [birth year] Sex: female Admission Date (Current Location): 09/04/2018  [facility] and [place] Number:  [employer] and Address:  [facility],  [address], [place] [address]      Provider Number: [id number]  Attending Physician Name and Address:  Vassie Loll, MD  Relative Name and Phone Number:       Current Level of Care: Hospital Recommended Level of Care: Skilled Nursing Facility Prior Approval Number:    Date Approved/Denied:   PASRR Number: [id number]  Discharge Plan: SNF    Current Diagnoses: Patient Active Problem List   Diagnosis Date Noted  . SVT (supraventricular tachycardia) (HCC) 09/05/2018  . Hypokalemia 09/04/2018  . Dehydration 09/04/2018  . Generalized weakness 09/04/2018  . COPD exacerbation (HCC) 01/27/2018  . Constipation 01/27/2018  . At high risk for falls 04/01/2016  . Patient had 2 or more falls in past year 03/25/2016  . Onychomycosis 08/23/2015  . Diastolic dysfunction 09/18/2013  . Coronary atherosclerosis of native coronary artery 01/30/2012  . CARPAL TUNNEL SYNDROME, BILATERAL 09/19/2010  . Dementia (HCC) 08/10/2010  . Vitamin D deficiency 04/25/2010  . FATIGUE 04/25/2010  . Prediabetes 02/10/2008  . Hyperlipidemia LDL goal <100 02/10/2008  . OBESITY 02/10/2008  . Essential hypertension, benign 02/10/2008    Orientation RESPIRATION BLADDER Height & Weight     Self, Time, Situation, Place  Normal Continent Weight: 72.3 kg Height:  5\' 3"  (160 cm)  BEHAVIORAL SYMPTOMS/MOOD NEUROLOGICAL BOWEL NUTRITION STATUS    (None) Continent Diet(Heart healthy)  AMBULATORY STATUS COMMUNICATION OF NEEDS Skin   Extensive Assist Verbally PU Stage and Appropriate Care, Other (Comment)(Stage II Sacrum Medial; Deep Tissue Injury L Buttock)                       Personal Care Assistance Level of Assistance   Bathing, Feeding, Dressing Bathing Assistance: Maximum assistance Feeding assistance: Independent Dressing Assistance: Maximum assistance     Functional Limitations Info  Sight, Hearing, Speech Sight Info: Adequate Hearing Info: Adequate Speech Info: Adequate    SPECIAL CARE FACTORS FREQUENCY  PT (By licensed PT)     PT Frequency: 5X/W              Contractures Contractures Info: Not present    Additional Factors Info  Code Status, Allergies Code Status Info: full Allergies Info: NKA           Current Medications (09/07/2018):  This is the current hospital active medication list Current Facility-Administered Medications  Medication Dose Route Frequency Provider Last Rate Last Dose  . acetaminophen (TYLENOL) tablet 650 mg  650 mg Oral Q6H PRN Opyd, Lavone Neri, MD   650 mg at 09/06/18 1459   Or  . acetaminophen (TYLENOL) suppository 650 mg  650 mg Rectal Q6H PRN Opyd, Lavone Neri, MD      . aspirin EC tablet 81 mg  81 mg Oral Daily Opyd, Lavone Neri, MD   81 mg at 09/07/18 0914  . enoxaparin (LOVENOX) injection 40 mg  40 mg Subcutaneous Q24H Opyd, Lavone Neri, MD   40 mg at 09/06/18 2117  . HYDROcodone-acetaminophen (NORCO/VICODIN) 5-325 MG per tablet 1 tablet  1 tablet Oral Q4H PRN Opyd, Lavone Neri, MD   1 tablet at 09/07/18 0347  . metoprolol succinate (TOPROL-XL) 24 hr tablet 25 mg  25 mg Oral Daily Opyd, Lavone Neri, MD  25 mg at 09/07/18 0914  . ondansetron (ZOFRAN) tablet 4 mg  4 mg Oral Q6H PRN Opyd, Lavone Neri, MD       Or  . ondansetron (ZOFRAN) injection 4 mg  4 mg Intravenous Q6H PRN Opyd, Lavone Neri, MD      . polyethylene glycol (MIRALAX / GLYCOLAX) packet 17 g  17 g Oral Daily PRN Opyd, Lavone Neri, MD      . sodium chloride flush (NS) 0.9 % injection 3 mL  3 mL Intravenous Q12H Opyd, Lavone Neri, MD   3 mL at 09/05/18 0850     Discharge Medications: Please see discharge summary for a list of discharge medications.  Relevant Imaging Results:  Relevant Lab  Results:   Additional Information [id number] [phone]  Ida Rogue, [place]

## 2018-09-08 DIAGNOSIS — L8990 Pressure ulcer of unspecified site, unspecified stage: Secondary | ICD-10-CM

## 2018-09-08 DIAGNOSIS — G301 Alzheimer's disease with late onset: Secondary | ICD-10-CM | POA: Diagnosis not present

## 2018-09-08 DIAGNOSIS — R262 Difficulty in walking, not elsewhere classified: Secondary | ICD-10-CM | POA: Diagnosis not present

## 2018-09-08 DIAGNOSIS — R531 Weakness: Secondary | ICD-10-CM | POA: Diagnosis not present

## 2018-09-08 DIAGNOSIS — I10 Essential (primary) hypertension: Secondary | ICD-10-CM | POA: Diagnosis not present

## 2018-09-08 DIAGNOSIS — E860 Dehydration: Secondary | ICD-10-CM | POA: Diagnosis not present

## 2018-09-08 DIAGNOSIS — L89152 Pressure ulcer of sacral region, stage 2: Secondary | ICD-10-CM

## 2018-09-08 NOTE — Progress Notes (Signed)
Physical Therapy Treatment Patient Details Name: [name] MRN: [id number] DOB: [birth year] Today's Date: 09/08/2018    History of Present Illness HARSHITA BERNALES is a 84 y.o. female with medical history significant for dementia, hypertension, and CAD, now presenting to the emergency department after being found on the floor at home.  Patient lives alone with family checking on her 3 times a week, is able to ambulate with a walker at baseline, was seen in her usual state on 09/02/2018, and was then found on the floor this evening.  She told family that she felt like she had "lead in her feet," and was unable to get up from the floor.  She is unable to recall if she had fallen or how she got there and family reports that her current mental status is consistent with her baseline.  She reported some right hip pain in the ED, and later denied it.  She denies any shortness of breath, chest pain, abdominal pain, vomiting, or diarrhea.  She does not remember if she took her medications today or yesterday and does not remember how long she had been on the ground.    PT Comments    Pt was able to take a few steps and work on light strengthening on both legs.  Her plan was to transition to SNF when ready and that is being worked on to get approval.  Follow acutely for progression of strength, balance training and gait training with cues for safety and deficits.  Fair retention of the training by pt.   Follow Up Recommendations  SNF     Equipment Recommendations  None recommended by PT    Recommendations for Other Services       Precautions / Restrictions Precautions Precautions: Fall Precaution Comments: purwick Restrictions Weight Bearing Restrictions: No    Mobility  Bed Mobility Overal bed mobility: Needs Assistance Bed Mobility: Supine to Sit;Sit to Supine     Supine to sit: Min assist;Mod assist Sit to supine: Mod assist   General bed mobility comments: assist for trunk and legs  into and out of bed  Transfers Overall transfer level: Needs assistance Equipment used: Rolling walker (2 wheeled) Transfers: Sit to/from Stand Sit to Stand: Min assist         General transfer comment: reminded hand placement but also assist on L side  Ambulation/Gait Ambulation/Gait assistance: Min assist Gait Distance (Feet): 8 Feet Assistive device: Rolling walker (2 wheeled) Gait Pattern/deviations: Step-to pattern;Decreased stride length;Wide base of support;Trunk flexed Gait velocity: reduced   General Gait Details: sidestep with RW   Stairs             Wheelchair Mobility    Modified Rankin (Stroke Patients Only)       Balance     Sitting balance-Leahy Scale: Fair                                      Cognition Arousal/Alertness: Awake/alert Behavior During Therapy: WFL for tasks assessed/performed Overall Cognitive Status: History of cognitive impairments - at baseline                                 General Comments: redirecting on information      Exercises General Exercises - Lower Extremity Ankle Circles/Pumps: Strengthening;Both;5 reps Long Arc Quad: Strengthening;Both;10 reps    General  Comments        Pertinent Vitals/Pain Pain Assessment: Faces Faces Pain Scale: Hurts even more Pain Location: R hip Pain Descriptors / Indicators: Jabbing Pain Intervention(s): Limited activity within patient's tolerance;Monitored during session;Premedicated before session;Repositioned;Patient requesting pain meds-RN notified    Home Living                      Prior Function            PT Goals (current goals can now be found in the care plan section) Progress towards PT goals: Progressing toward goals    Frequency    Min 3X/week      PT Plan Current plan remains appropriate    Co-evaluation              AM-PAC PT "6 Clicks" Daily Activity  Outcome Measure  Difficulty turning over in  bed (including adjusting bedclothes, sheets and blankets)?: Unable Difficulty moving from lying on back to sitting on the side of the bed? : Unable Difficulty sitting down on and standing up from a chair with arms (e.g., wheelchair, bedside commode, etc,.)?: Unable Help needed moving to and from a bed to chair (including a wheelchair)?: A Little Help needed walking in hospital room?: A Little Help needed climbing 3-5 steps with a railing? : Total 6 Click Score: 10    End of Session Equipment Utilized During Treatment: Gait belt Activity Tolerance: Patient tolerated treatment well;Patient limited by fatigue Patient left: in bed;with call bell/phone within reach;with bed alarm set;with family/visitor present Nurse Communication: Mobility status PT Visit Diagnosis: Unsteadiness on feet (R26.81);History of falling (Z91.81);Muscle weakness (generalized) (M62.81);Difficulty in walking, not elsewhere classified (R26.2)     Time: 1308-6578 PT Time Calculation (min) (ACUTE ONLY): 22 min  Charges:  $Gait Training: 8-22 mins                     Ivar Drape 09/08/2018, 3:39 PM   3:42 PM, 09/08/18 Samul Dada, PT, MS Physical Therapist - [place] [phone] (Office)

## 2018-09-08 NOTE — Discharge Summary (Signed)
Physician Discharge Summary  [name] VWU:[id number] DOB: 1926-06-21 DOA: 09/04/2018  PCP: Hannah Perches, MD  Admit date: 09/04/2018 Discharge date: 09/08/2018  Time spent: 35 minutes  Recommendations for Outpatient Follow-up:  Repeat basic metabolic panel in 5 days to reassess electrolytes and renal function trend. Reassess blood pressure and further adjust antihypertensive regimen as needed.  Discharge Diagnoses:  Principal Problem:   Generalized weakness Active Problems:   Dementia (HCC)   Essential hypertension, benign   Hypokalemia   Dehydration   SVT (supraventricular tachycardia) (HCC)   Pressure injury of skin   Discharge Condition: Stable and improved.  Patient will be discharged to skilled nursing facility when bed available for further care and rehabilitation.  Diet recommendation: Heart healthy diet  Filed Weights   09/04/18 1802 09/04/18 2201  Weight: 79.7 kg 72.3 kg    History of present illness:  As per H&P written by Dr. [name] on 09/04/2018 84 y.o. female with medical history significant for dementia, hypertension, and CAD, now presenting to the emergency department after being found on the floor at home.  Patient lives alone with family checking on her 3 times a week, is able to ambulate with a walker at baseline, was seen in her usual state on 09/02/2018, and was then found on the floor this evening.  She told family that she felt like she had "lead in her feet," and was unable to get up from the floor.  She is unable to recall if she had fallen or how she got there and family reports that her current mental status is consistent with her baseline.  She reported some right hip pain in the ED, and later denied it.  She denies any shortness of breath, chest pain, abdominal pain, vomiting, or diarrhea.  She does not remember if she took her medications today or yesterday and does not remember how long she had been on the ground.  ED Course: Upon arrival to  the ED, patient is found to be afebrile, saturating 90 on room air, tachycardic and hypertensive.  EKG features a sinus rhythm and chest x-ray features patchy airspace opacities bilaterally, some of which is chronic, and with acute on chronic infiltrate not entirely excluded.  Noncontrast head CT is negative for acute intracranial abnormality.  Chemistry panel features and elevated BUN to creatinine ratio and potassium of 3.2.  CBC features a mild leukocytosis.  Troponin is undetectable.  Serum CK is mildly elevated at 572.  Patient was given a Lopressor IV push in the ED and 1 L of normal saline.  Tachycardia resolved and blood pressure normalized.  Patient complains of right hip pain again and is being sent for CT hip after negative radiographs.  She will be observed for ongoing evaluation and management of generalized weakness and lethargy.  Hospital Course:  1-Generalized weakness -Felt to be related with dehydration and deconditioning -Physical therapy has seen patient and is recommending a skilled nursing facility placement. -will discharge to SNF for further care and rehab.  -TSH normal, B12 elevated -Patient responded appropriately to fluid resuscitation and is eating and drinking properly now.  2-Dementia -Unknown baseline -patient oriented X2 currently; following commands appropriately unable to answer questions. -she is unable to look after herself and performed all activities of daily living without assistance or supervision. -Physical therapy has recommended skilled nursing facility at discharge; patient and family in agreement. -SW assisting with placement.   3-Essential hypertension, benign -appears to be well controlled -will continue metoprolol  -Monitor  sodium intake.  4-Hypokalemia -repleted and WNL now -encourage adequate hydration and PO intake. -Repeat basic metabolic panel at follow-up visit to reassess electrolytes trend.  5-Dehydration -resolved with IVF  resuscitation -encourage to maintain good oral hydration  -Diuretics has been discontinued at time of discharge.  (HCTZ).  6-SVT (supraventricular tachycardia) (HCC) -resolved after electrolytes repletion, fluid resuscitation and resumption of home beta-blockers.   7-stage 1-2 pressure injury -continue prevention measures and constant repositioning  -Most likely present prior to admission  Procedures:  See below for x-ray reports   Consultations:  None   Discharge Exam: Vitals:   09/08/18 0604 09/08/18 1413  BP: (!) 167/90 (!) 166/78  Pulse: 100 96  Resp: 18 19  Temp:  98.5 F (36.9 C)  SpO2: 97% 98%   General exam: Alert, awake, oriented x 2; no acute distress.  Patient afebrile, denying chest pain, no shortness of breath.  Feeling weak but otherwise in no distress or having acute complaints. Respiratory system: Clear to auscultation. Respiratory effort normal. Cardiovascular system:RRR. No murmurs, rubs, gallops. Gastrointestinal system: Abdomen is nondistended, soft and nontender. No organomegaly or masses felt. Normal bowel sounds heard. Central nervous system: Alert and oriented. No focal neurological deficits. Extremities: No C/C/E, +pedal pulses Skin: No rashes, no petechiae; after assessing with nursing staff, stage 1-2 pressure injury appreciated on her sacrum area. Unsure, but presume Injury present on admission (first seen on 09/06/18). Psychiatry: Judgement and insight appear normal. Mood & affect appropriate.   Discharge Instructions   Discharge Instructions    Diet - low sodium heart healthy   Complete by:  As directed    Discharge instructions   Complete by:  As directed    Maintain adequate hydration Repeat basic metabolic panel in 5 days to follow electrolytes and renal function Physical therapy as per the skilled nursing facility protocol.     Allergies as of 09/08/2018   No Known Allergies     Medication List    STOP taking these medications    hydrochlorothiazide 12.5 MG capsule Commonly known as:  MICROZIDE     TAKE these medications   aspirin EC 81 MG tablet Take 1 tablet (81 mg total) by mouth daily.   metoprolol succinate 25 MG 24 hr tablet Commonly known as:  TOPROL-XL TAKE 1 TABLET BY MOUTH ONCE DAILY.   polyethylene glycol packet Commonly known as:  MIRALAX / GLYCOLAX Take 17 g by mouth daily. What changed:    when to take this  reasons to take this   Vitamin D (Ergocalciferol) 50000 units Caps capsule Commonly known as:  DRISDOL TAKE 1 CAPSULE BY MOUTH ONCE WEEKLY. What changed:  when to take this      No Known Allergies Follow-up Information    Hannah Perches, MD. Schedule an appointment as soon as possible for a visit in 10 day(s).   Specialty:  Family Medicine Why:  After discharge from the skilled nursing facility Contact information: [address] [place] [address] [phone]           The results of significant diagnostics from this hospitalization (including imaging, microbiology, ancillary and laboratory) are listed below for reference.    Significant Diagnostic Studies: Dg Chest 2 View  Result Date: 09/04/2018 CLINICAL DATA:  Found on for with altered level consciousness EXAM: CHEST - 2 VIEW COMPARISON:  01/27/2018 FINDINGS: Cardiac shadow is within normal limits. Aortic calcifications are noted. Lungs are well aerated bilaterally. Elevation the right hemidiaphragm is again seen.  There is persistent central vascular congestion and bilateral airspace opacities seen. Slight improvement when compare with the prior study is noted. An acute on chronic infiltrative process could not be totally excluded. IMPRESSION: Patchy airspace opacities bilaterally. Portion of these are chronic in nature. Acute on chronic infiltrate cannot be totally excluded. Electronically Signed   By: Alcide Clever M.D.   On: 09/04/2018 19:20   Dg Pelvis 1-2 Views  Result Date:  09/04/2018 CLINICAL DATA:  Found on floor with altered level of consciousness EXAM: PELVIS - 1-2 VIEW COMPARISON:  None. FINDINGS: No acute fracture or dislocation is noted. Degenerative changes of lumbar spine are noted. No soft tissue changes are seen. IMPRESSION: No acute abnormality noted. Electronically Signed   By: Alcide Clever M.D.   On: 09/04/2018 19:21   Ct Head Wo Contrast  Result Date: 09/04/2018 CLINICAL DATA:  Found on floor by family with generalized weakness EXAM: CT HEAD WITHOUT CONTRAST TECHNIQUE: Contiguous axial images were obtained from the base of the skull through the vertex without intravenous contrast. COMPARISON:  04/04/2007 FINDINGS: Brain: Mild atrophic changes and chronic white matter ischemic changes are seen. No findings to suggest acute hemorrhage, acute infarction or space-occupying mass lesion are noted. Vascular: No hyperdense vessel or unexpected calcification. Skull: Normal. Negative for fracture or focal lesion. Sinuses/Orbits: No acute finding. Other: None. IMPRESSION: Chronic atrophic and ischemic changes without acute abnormality. Electronically Signed   By: Alcide Clever M.D.   On: 09/04/2018 19:25   Ct Hip Right Wo Contrast  Result Date: 09/04/2018 CLINICAL DATA:  Recent fall with hip pain, initial encounter EXAM: CT OF THE RIGHT HIP WITHOUT CONTRAST TECHNIQUE: Multidetector CT imaging of the right hip was performed according to the standard protocol. Multiplanar CT image reconstructions were also generated. COMPARISON:  Plain film from earlier in the same day. FINDINGS: Bones/Joint/Cartilage Bony structures are well visualized. No findings to suggest acute fracture are seen. Degenerative changes of the sacroiliac joints and lower lumbar spine are noted. No definitive joint effusion is seen. Ligaments Suboptimally assessed by CT. Muscles and Tendons Visualized muscular structures are within normal limits. Soft tissues Surrounding soft tissues demonstrate no focal  hematoma. A fat containing anterior abdominal wall hernia is noted to the right of the umbilicus. The pelvic structures as visualized are within normal limits. IMPRESSION: No evidence of acute right femoral fracture. No other acute abnormality is seen. Electronically Signed   By: Alcide Clever M.D.   On: 09/04/2018 21:27   Labs: Basic Metabolic Panel: Recent Labs  Lab 09/04/18 1816 09/05/18 0627 09/06/18 0646  NA 143 143 142  K 3.2* 3.8 4.1  CL 104 108 112*  CO2 27 27 25   GLUCOSE 122* 99 87  BUN 27* 20 17  CREATININE 0.80 0.58 0.61  CALCIUM 9.7 9.0 8.6*  MG  --  2.0  --    Liver Function Tests: Recent Labs  Lab 09/04/18 1816  AST 39  ALT 14  ALKPHOS 72  BILITOT 1.5*  PROT 8.1  ALBUMIN 4.1   CBC: Recent Labs  Lab 09/04/18 1816 09/05/18 0627 09/06/18 0646  WBC 11.4* 12.9* 8.0  NEUTROABS 9.8* 10.6*  --   HGB 13.6 12.7 11.5*  HCT 44.3 41.9  40.2 38.5  MCV 85.0 87.5 86.5  PLT 286 227 219   Cardiac Enzymes: Recent Labs  Lab 09/04/18 1816 09/05/18 0627  CKTOTAL 572* 473*  TROPONINI <0.03  --    BNP: BNP (last 3 results) Recent Labs  01/27/18 2302  BNP 95.0   CBG: Recent Labs  Lab 09/04/18 1807  GLUCAP 107*    Signed:  Vassie Loll MD.  [facility] 09/08/2018, 4:10 PM

## 2018-09-08 NOTE — Clinical Social Work Note (Signed)
[facility] is working on authorization for pt.  Spoke with [name], family member point person, who supports this plan.

## 2018-09-09 DIAGNOSIS — R531 Weakness: Secondary | ICD-10-CM | POA: Diagnosis not present

## 2018-09-09 DIAGNOSIS — I10 Essential (primary) hypertension: Secondary | ICD-10-CM | POA: Diagnosis not present

## 2018-09-09 DIAGNOSIS — E860 Dehydration: Secondary | ICD-10-CM | POA: Diagnosis not present

## 2018-09-09 DIAGNOSIS — Z79899 Other long term (current) drug therapy: Secondary | ICD-10-CM | POA: Diagnosis not present

## 2018-09-09 DIAGNOSIS — M6282 Rhabdomyolysis: Secondary | ICD-10-CM

## 2018-09-09 DIAGNOSIS — I2510 Atherosclerotic heart disease of native coronary artery without angina pectoris: Secondary | ICD-10-CM | POA: Diagnosis not present

## 2018-09-09 DIAGNOSIS — I471 Supraventricular tachycardia: Secondary | ICD-10-CM | POA: Diagnosis not present

## 2018-09-09 DIAGNOSIS — F0390 Unspecified dementia without behavioral disturbance: Secondary | ICD-10-CM

## 2018-09-09 DIAGNOSIS — J069 Acute upper respiratory infection, unspecified: Secondary | ICD-10-CM | POA: Diagnosis not present

## 2018-09-09 DIAGNOSIS — R2681 Unsteadiness on feet: Secondary | ICD-10-CM | POA: Diagnosis not present

## 2018-09-09 DIAGNOSIS — Z87891 Personal history of nicotine dependence: Secondary | ICD-10-CM | POA: Diagnosis not present

## 2018-09-09 DIAGNOSIS — M6281 Muscle weakness (generalized): Secondary | ICD-10-CM | POA: Diagnosis not present

## 2018-09-09 DIAGNOSIS — E876 Hypokalemia: Secondary | ICD-10-CM | POA: Diagnosis not present

## 2018-09-09 DIAGNOSIS — Z7982 Long term (current) use of aspirin: Secondary | ICD-10-CM | POA: Diagnosis not present

## 2018-09-09 DIAGNOSIS — L8990 Pressure ulcer of unspecified site, unspecified stage: Secondary | ICD-10-CM | POA: Diagnosis not present

## 2018-09-09 DIAGNOSIS — R262 Difficulty in walking, not elsewhere classified: Secondary | ICD-10-CM | POA: Diagnosis not present

## 2018-09-09 DIAGNOSIS — R1312 Dysphagia, oropharyngeal phase: Secondary | ICD-10-CM | POA: Diagnosis not present

## 2018-09-09 LAB — BASIC METABOLIC PANEL
Anion gap: 7 (ref 5–15)
BUN: 9 mg/dL (ref 8–23)
CHLORIDE: 107 mmol/L (ref 98–111)
CO2: 27 mmol/L (ref 22–32)
CREATININE: 0.51 mg/dL (ref 0.44–1.00)
Calcium: 8.6 mg/dL — ABNORMAL LOW (ref 8.9–10.3)
GFR calc Af Amer: >60 mL/min (ref >60)
GFR calc non Af Amer: >60 mL/min (ref >60)
Glucose, Bld: 126 mg/dL — ABNORMAL HIGH (ref 70–99)
POTASSIUM: 3.9 mmol/L (ref 3.5–5.1)
Sodium: 141 mmol/L (ref 135–145)

## 2018-09-09 LAB — CK: Total CK: 51 U/L (ref 38–234)

## 2018-09-09 MED ORDER — METOPROLOL SUCCINATE ER 50 MG PO TB24: 50.0000 mg | ORAL_TABLET | Freq: Every day | ORAL | 0 refills | Status: AC

## 2018-09-09 NOTE — Discharge Summary (Signed)
Physician Discharge Summary  [name]:[id number] DOB: [birth year] DOA: 09/04/2018  PCP: Kerri Perches, MD  Admit date: 09/04/2018 Discharge date: 09/09/2018  Admitted From:Home Disposition:   SNF  Recommendations for Outpatient Follow-up:  1. Follow up with PCP in 1-2 weeks 2. Please obtain BMP/CBC in one week     Discharge Condition: Stable CODE STATUS:FULL Diet recommendation: Heart Healthy   Brief/Interim Summary: As per H&P written by Dr. [name] on 09/04/2018 84 y.o.femalewith medical history significant fordementia, hypertension, and CAD, now presenting to the emergency department after being found on the floor at home. Patient lives alone with family checking on her 3 times a week, is able to ambulate with a walker at baseline, was seen in her usual state on 09/02/2018, and was then found on the floor this evening. She told family that she felt like she had "lead in her feet," and was unable to get up from the floor. She is unable to recall if she had fallen or how she got there and family reports that her current mental status is consistent with her baseline. She reported some right hip pain in the ED, and later denied it. She denies any shortness of breath, chest pain, abdominal pain, vomiting, or diarrhea. She does not remember if she took her medications today or yesterday and does not remember how long she had been on the ground.  ED Course:Upon arrival to the ED, patient is found to be afebrile, saturating 90 on room air, tachycardic and hypertensive. EKG features a sinus rhythm and chest x-ray features patchy airspace opacities bilaterally, some of which is chronic, and with acute on chronic infiltrate not entirely excluded. Noncontrast head CT is negative for acute intracranial abnormality. Chemistry panel features and elevated BUN to creatinine ratio and potassium of 3.2. CBC features a mild leukocytosis. Troponin is undetectable. Serum CK is  mildly elevated at 572. Patient was given a Lopressor IV push in the ED and 1 L of normal saline. Tachycardia resolved and blood pressure normalized. Patient complains of right hip pain again and is being sent for CT hip after negative radiographs. She will be observed for ongoing evaluation and management of generalized weakness and lethargy.  Discharge Diagnoses:  1-Generalized weakness -Felt to be related with dehydration, deconditioning, and mild rhabdomyolysis -Physical therapy has seen patient and is recommending a skilled nursing facility placement. -will discharge to SNF for further care and rehab.  -TSH normal, B12 elevated -RBC folate normal -Patient responded appropriately to fluid resuscitation and is eating and drinking properly now.  2-Dementia -Unknown baseline -patient oriented X2 currently; following commands. appropriately answers questions. -she is unable to look after herself and performed all activities of daily living without assistance or supervision. -Physical therapy has recommended skilled nursing facility at discharge; patient and family in agreement. -SW assisting with placement.   3-Essential hypertension, benign -appears to be well controlled -will continue metoprolol--increase to 50 mg daily -Monitor sodium intake.  4-Hypokalemia -repleted and WNL now -encourage adequate hydration and PO intake. -Repeat basic metabolic panel at follow-up visit to reassess electrolytes trend. -d/c HCTZ  5-Dehydration -resolved with IVF resuscitation -encourage to maintain good oral hydration  -Diuretics has been discontinued at time of discharge.  (HCTZ).  6-SVT (supraventricular tachycardia) (HCC) -resolved after electrolytes repletion, fluid resuscitation and resumption of home beta-blockers.  -increase metoprolol succinate to 50 mg daily  7-stage 1-2 pressureinjury -continue prevention measures and constant repositioning -present prior to  admission  8- Rhabdomyolysis -CK--572>>>51 -improved with IVF  Discharge Instructions  Discharge Instructions    Diet - low sodium heart healthy   Complete by:  As directed    Discharge instructions   Complete by:  As directed    Maintain adequate hydration Repeat basic metabolic panel in 5 days to follow electrolytes and renal function Physical therapy as per the skilled nursing facility protocol.     Allergies as of 09/09/2018   No Known Allergies     Medication List    STOP taking these medications   hydrochlorothiazide 12.5 MG capsule Commonly known as:  MICROZIDE     TAKE these medications   aspirin EC 81 MG tablet Take 1 tablet (81 mg total) by mouth daily.   metoprolol succinate 50 MG 24 hr tablet Commonly known as:  TOPROL-XL Take 1 tablet (50 mg total) by mouth daily. Start taking on:  09/10/2018 What changed:    medication strength  how much to take   polyethylene glycol packet Commonly known as:  MIRALAX / GLYCOLAX Take 17 g by mouth daily. What changed:    when to take this  reasons to take this   Vitamin D (Ergocalciferol) 1.25 MG (50000 UT) Caps capsule Commonly known as:  DRISDOL TAKE 1 CAPSULE BY MOUTH ONCE WEEKLY. What changed:  when to take this      Follow-up Information    Kerri Perches, MD. Schedule an appointment as soon as possible for a visit in 10 day(s).   Specialty:  Family Medicine Why:  After discharge from the skilled nursing facility Contact information: [address], [address] [place] [address] [phone]          No Known Allergies  Consultations:  none   Procedures/Studies: Dg Chest 2 View  Result Date: 09/04/2018 CLINICAL DATA:  Found on for with altered level consciousness EXAM: CHEST - 2 VIEW COMPARISON:  01/27/2018 FINDINGS: Cardiac shadow is within normal limits. Aortic calcifications are noted. Lungs are well aerated bilaterally. Elevation the right hemidiaphragm is again seen.  There is persistent central vascular congestion and bilateral airspace opacities seen. Slight improvement when compare with the prior study is noted. An acute on chronic infiltrative process could not be totally excluded. IMPRESSION: Patchy airspace opacities bilaterally. Portion of these are chronic in nature. Acute on chronic infiltrate cannot be totally excluded. Electronically Signed   By: Alcide Clever M.D.   On: 09/04/2018 19:20   Dg Pelvis 1-2 Views  Result Date: 09/04/2018 CLINICAL DATA:  Found on floor with altered level of consciousness EXAM: PELVIS - 1-2 VIEW COMPARISON:  None. FINDINGS: No acute fracture or dislocation is noted. Degenerative changes of lumbar spine are noted. No soft tissue changes are seen. IMPRESSION: No acute abnormality noted. Electronically Signed   By: Alcide Clever M.D.   On: 09/04/2018 19:21   Ct Head Wo Contrast  Result Date: 09/04/2018 CLINICAL DATA:  Found on floor by family with generalized weakness EXAM: CT HEAD WITHOUT CONTRAST TECHNIQUE: Contiguous axial images were obtained from the base of the skull through the vertex without intravenous contrast. COMPARISON:  04/04/2007 FINDINGS: Brain: Mild atrophic changes and chronic white matter ischemic changes are seen. No findings to suggest acute hemorrhage, acute infarction or space-occupying mass lesion are noted. Vascular: No hyperdense vessel or unexpected calcification. Skull: Normal. Negative for fracture or focal lesion. Sinuses/Orbits: No acute finding. Other: None. IMPRESSION: Chronic atrophic and ischemic changes without acute abnormality. Electronically Signed   By: Alcide Clever M.D.   On: 09/04/2018 19:25  Ct Hip Right Wo Contrast  Result Date: 09/04/2018 CLINICAL DATA:  Recent fall with hip pain, initial encounter EXAM: CT OF THE RIGHT HIP WITHOUT CONTRAST TECHNIQUE: Multidetector CT imaging of the right hip was performed according to the standard protocol. Multiplanar CT image reconstructions were also  generated. COMPARISON:  Plain film from earlier in the same day. FINDINGS: Bones/Joint/Cartilage Bony structures are well visualized. No findings to suggest acute fracture are seen. Degenerative changes of the sacroiliac joints and lower lumbar spine are noted. No definitive joint effusion is seen. Ligaments Suboptimally assessed by CT. Muscles and Tendons Visualized muscular structures are within normal limits. Soft tissues Surrounding soft tissues demonstrate no focal hematoma. A fat containing anterior abdominal wall hernia is noted to the right of the umbilicus. The pelvic structures as visualized are within normal limits. IMPRESSION: No evidence of acute right femoral fracture. No other acute abnormality is seen. Electronically Signed   By: Alcide Clever M.D.   On: 09/04/2018 21:27        Discharge Exam: Vitals:   09/08/18 2125 09/09/18 0442  BP: (!) 173/61 (!) 157/61  Pulse: 90 81  Resp: 20 16  Temp: 99.1 F (37.3 C) 98.7 F (37.1 C)  SpO2: 96% 98%   Vitals:   09/08/18 0604 09/08/18 1413 09/08/18 2125 09/09/18 0442  BP: (!) 167/90 (!) 166/78 (!) 173/61 (!) 157/61  Pulse: 100 96 90 81  Resp: 18 19 20 16   Temp:  98.5 F (36.9 C) 99.1 F (37.3 C) 98.7 F (37.1 C)  TempSrc: Oral  Oral Oral  SpO2: 97% 98% 96% 98%  Weight:      Height:        General: Pt is alert, awake, not in acute distress Cardiovascular: RRR, S1/S2 +, no rubs, no gallops Respiratory: bibasilar crackles, no wheeze Abdominal: Soft, NT, ND, bowel sounds + Extremities: no edema, no cyanosis   The results of significant diagnostics from this hospitalization (including imaging, microbiology, ancillary and laboratory) are listed below for reference.    Significant Diagnostic Studies: Dg Chest 2 View  Result Date: 09/04/2018 CLINICAL DATA:  Found on for with altered level consciousness EXAM: CHEST - 2 VIEW COMPARISON:  01/27/2018 FINDINGS: Cardiac shadow is within normal limits. Aortic calcifications are  noted. Lungs are well aerated bilaterally. Elevation the right hemidiaphragm is again seen. There is persistent central vascular congestion and bilateral airspace opacities seen. Slight improvement when compare with the prior study is noted. An acute on chronic infiltrative process could not be totally excluded. IMPRESSION: Patchy airspace opacities bilaterally. Portion of these are chronic in nature. Acute on chronic infiltrate cannot be totally excluded. Electronically Signed   By: Alcide Clever M.D.   On: 09/04/2018 19:20   Dg Pelvis 1-2 Views  Result Date: 09/04/2018 CLINICAL DATA:  Found on floor with altered level of consciousness EXAM: PELVIS - 1-2 VIEW COMPARISON:  None. FINDINGS: No acute fracture or dislocation is noted. Degenerative changes of lumbar spine are noted. No soft tissue changes are seen. IMPRESSION: No acute abnormality noted. Electronically Signed   By: Alcide Clever M.D.   On: 09/04/2018 19:21   Ct Head Wo Contrast  Result Date: 09/04/2018 CLINICAL DATA:  Found on floor by family with generalized weakness EXAM: CT HEAD WITHOUT CONTRAST TECHNIQUE: Contiguous axial images were obtained from the base of the skull through the vertex without intravenous contrast. COMPARISON:  04/04/2007 FINDINGS: Brain: Mild atrophic changes and chronic white matter ischemic changes are seen. No findings to suggest  acute hemorrhage, acute infarction or space-occupying mass lesion are noted. Vascular: No hyperdense vessel or unexpected calcification. Skull: Normal. Negative for fracture or focal lesion. Sinuses/Orbits: No acute finding. Other: None. IMPRESSION: Chronic atrophic and ischemic changes without acute abnormality. Electronically Signed   By: Alcide Clever M.D.   On: 09/04/2018 19:25   Ct Hip Right Wo Contrast  Result Date: 09/04/2018 CLINICAL DATA:  Recent fall with hip pain, initial encounter EXAM: CT OF THE RIGHT HIP WITHOUT CONTRAST TECHNIQUE: Multidetector CT imaging of the right hip was  performed according to the standard protocol. Multiplanar CT image reconstructions were also generated. COMPARISON:  Plain film from earlier in the same day. FINDINGS: Bones/Joint/Cartilage Bony structures are well visualized. No findings to suggest acute fracture are seen. Degenerative changes of the sacroiliac joints and lower lumbar spine are noted. No definitive joint effusion is seen. Ligaments Suboptimally assessed by CT. Muscles and Tendons Visualized muscular structures are within normal limits. Soft tissues Surrounding soft tissues demonstrate no focal hematoma. A fat containing anterior abdominal wall hernia is noted to the right of the umbilicus. The pelvic structures as visualized are within normal limits. IMPRESSION: No evidence of acute right femoral fracture. No other acute abnormality is seen. Electronically Signed   By: Alcide Clever M.D.   On: 09/04/2018 21:27     Microbiology: No results found for this or any previous visit (from the past 240 hour(s)).   Labs: Basic Metabolic Panel: Recent Labs  Lab 09/04/18 1816 09/05/18 0627 09/06/18 0646 09/09/18 1032  NA 143 143 142 141  K 3.2* 3.8 4.1 3.9  CL 104 108 112* 107  CO2 27 27 25 27   GLUCOSE 122* 99 87 126*  BUN 27* 20 17 9   CREATININE 0.80 0.58 0.61 0.51  CALCIUM 9.7 9.0 8.6* 8.6*  MG  --  2.0  --   --    Liver Function Tests: Recent Labs  Lab 09/04/18 1816  AST 39  ALT 14  ALKPHOS 72  BILITOT 1.5*  PROT 8.1  ALBUMIN 4.1   No results for input(s): LIPASE, AMYLASE in the last 168 hours. No results for input(s): AMMONIA in the last 168 hours. CBC: Recent Labs  Lab 09/04/18 1816 09/05/18 0627 09/06/18 0646  WBC 11.4* 12.9* 8.0  NEUTROABS 9.8* 10.6*  --   HGB 13.6 12.7 11.5*  HCT 44.3 41.9  40.2 38.5  MCV 85.0 87.5 86.5  PLT 286 227 219   Cardiac Enzymes: Recent Labs  Lab 09/04/18 1816 09/05/18 0627 09/09/18 1032  CKTOTAL 572* 473* 51  TROPONINI <0.03  --   --    BNP: Invalid input(s):  POCBNP CBG: Recent Labs  Lab 09/04/18 1807  GLUCAP 107*    Time coordinating discharge:  36 minutes  Signed:  Catarina Hartshorn, DO [facility] Pager: [phone] 09/09/2018, 12:43 PM

## 2018-09-09 NOTE — Progress Notes (Signed)
Patient discharging to [facility].  Report called to [facility], LPN at facility.  All questions answered.  Patient made aware and dressed in red scrubs - no belongings at bedside.  Patient in NAD at this time.

## 2018-09-09 NOTE — Clinical Social Work Placement (Signed)
   CLINICAL SOCIAL WORK PLACEMENT  NOTE  Date:  09/09/2018  Patient Details  Name: [name] MRN: [id number] Date of Birth: [birth year]  Clinical Social Work is seeking post-discharge placement for this patient at the Skilled  Nursing Facility level of care (*CSW will initial, date and re-position this form in  chart as items are completed):  Yes   Patient/family provided with [facility] Clinical Social Work Department's list of facilities offering this level of care within the geographic area requested by the patient (or if unable, by the patient's family).  Yes   Patient/family informed of their freedom to choose among providers that offer the needed level of care, that participate in Medicare, Medicaid or managed care program needed by the patient, have an available bed and are willing to accept the patient.  Yes   Patient/family informed of [facility]'s ownership interest in [facility] and [facility], as well as of the fact that they are under no obligation to receive care at these facilities.  PASRR submitted to EDS on 09/07/18     PASRR number received on 09/07/18     Existing PASRR number confirmed on       FL2 transmitted to all facilities in geographic area requested by pt/family on 09/07/18     FL2 transmitted to all facilities within larger geographic area on       Patient informed that his/her managed care company has contracts with or will negotiate with certain facilities, including the following:        Yes   Patient/family informed of bed offers received.  Patient chooses bed at [facility]     Physician recommends and patient chooses bed at      Patient to be transferred to [facility] on 09/09/18.  Patient to be transferred to facility by Facility     Patient family notified on 09/09/18 of transfer.  Name of family member notified:  Harlow Asa     PHYSICIAN       Additional Comment:     _______________________________________________ Ida Rogue, LCSW 09/09/2018, 1:04 PM

## 2018-09-10 ENCOUNTER — Telehealth: Payer: Self-pay

## 2018-09-10 DIAGNOSIS — E876 Hypokalemia: Secondary | ICD-10-CM | POA: Diagnosis not present

## 2018-09-10 DIAGNOSIS — E860 Dehydration: Secondary | ICD-10-CM | POA: Diagnosis not present

## 2018-09-10 DIAGNOSIS — I2510 Atherosclerotic heart disease of native coronary artery without angina pectoris: Secondary | ICD-10-CM | POA: Diagnosis not present

## 2018-09-10 DIAGNOSIS — M6281 Muscle weakness (generalized): Secondary | ICD-10-CM | POA: Diagnosis not present

## 2018-09-10 NOTE — Telephone Encounter (Signed)
First attempt to contact patient or family. 09-10-18 8:30 am. No answer, left message for family to return call as soon as possible. It appears from D/C notes that patient was discharged to a skilled nursing facility.

## 2018-09-11 DIAGNOSIS — M6281 Muscle weakness (generalized): Secondary | ICD-10-CM | POA: Diagnosis not present

## 2018-09-11 DIAGNOSIS — I10 Essential (primary) hypertension: Secondary | ICD-10-CM | POA: Diagnosis not present

## 2018-09-11 DIAGNOSIS — I2510 Atherosclerotic heart disease of native coronary artery without angina pectoris: Secondary | ICD-10-CM | POA: Diagnosis not present

## 2018-09-15 ENCOUNTER — Telehealth: Payer: Self-pay | Admitting: Family Medicine

## 2018-09-15 NOTE — Telephone Encounter (Signed)
[name] is calling to let us know that they found [name] in the floor, she was responsive, kinda like she fell and couldn't remember how to get up. [name].

## 2018-09-16 DIAGNOSIS — I10 Essential (primary) hypertension: Secondary | ICD-10-CM | POA: Diagnosis not present

## 2018-09-16 DIAGNOSIS — M6281 Muscle weakness (generalized): Secondary | ICD-10-CM | POA: Diagnosis not present

## 2018-09-16 NOTE — Telephone Encounter (Signed)
Spoke with patient's daughter Elnita MaxwellCheryl, she stated that patient was found on the floor in her home on the Friday before she was admitted [facility]. She was evaluated in the hospital and test were negative. [facility] is aware of the patient being found in the floor. Currently patient is working with physical therapy at the nursing home. It is unclear at this point if patient will return home, or need to stay in an assisted living facility. Daughter will keep us posted

## 2018-09-16 NOTE — Telephone Encounter (Signed)
Is she being managed by a doc in the [facility], if so please advise calletr to let the Doc know

## 2018-09-21 DIAGNOSIS — J069 Acute upper respiratory infection, unspecified: Secondary | ICD-10-CM | POA: Diagnosis not present

## 2018-09-21 DIAGNOSIS — M6281 Muscle weakness (generalized): Secondary | ICD-10-CM | POA: Diagnosis not present

## 2018-09-28 DIAGNOSIS — M6281 Muscle weakness (generalized): Secondary | ICD-10-CM | POA: Diagnosis not present

## 2018-09-28 DIAGNOSIS — R05 Cough: Secondary | ICD-10-CM | POA: Diagnosis not present

## 2018-10-05 ENCOUNTER — Encounter: Payer: Medicare Other | Admitting: Family Medicine

## 2018-10-05 DIAGNOSIS — I471 Supraventricular tachycardia: Secondary | ICD-10-CM | POA: Diagnosis not present

## 2018-10-05 DIAGNOSIS — I2510 Atherosclerotic heart disease of native coronary artery without angina pectoris: Secondary | ICD-10-CM | POA: Diagnosis not present

## 2018-10-05 DIAGNOSIS — I10 Essential (primary) hypertension: Secondary | ICD-10-CM | POA: Diagnosis not present

## 2018-10-07 DIAGNOSIS — I2510 Atherosclerotic heart disease of native coronary artery without angina pectoris: Secondary | ICD-10-CM | POA: Diagnosis not present

## 2018-10-07 DIAGNOSIS — M6281 Muscle weakness (generalized): Secondary | ICD-10-CM | POA: Diagnosis not present

## 2018-10-07 DIAGNOSIS — R262 Difficulty in walking, not elsewhere classified: Secondary | ICD-10-CM | POA: Diagnosis not present

## 2018-10-07 DIAGNOSIS — R1312 Dysphagia, oropharyngeal phase: Secondary | ICD-10-CM | POA: Diagnosis not present

## 2018-10-08 DIAGNOSIS — R262 Difficulty in walking, not elsewhere classified: Secondary | ICD-10-CM | POA: Diagnosis not present

## 2018-10-08 DIAGNOSIS — I2510 Atherosclerotic heart disease of native coronary artery without angina pectoris: Secondary | ICD-10-CM | POA: Diagnosis not present

## 2018-10-08 DIAGNOSIS — M6281 Muscle weakness (generalized): Secondary | ICD-10-CM | POA: Diagnosis not present

## 2018-10-08 DIAGNOSIS — R1312 Dysphagia, oropharyngeal phase: Secondary | ICD-10-CM | POA: Diagnosis not present

## 2018-10-09 DIAGNOSIS — I2510 Atherosclerotic heart disease of native coronary artery without angina pectoris: Secondary | ICD-10-CM | POA: Diagnosis not present

## 2018-10-09 DIAGNOSIS — R262 Difficulty in walking, not elsewhere classified: Secondary | ICD-10-CM | POA: Diagnosis not present

## 2018-10-09 DIAGNOSIS — M6281 Muscle weakness (generalized): Secondary | ICD-10-CM | POA: Diagnosis not present

## 2018-10-09 DIAGNOSIS — R1312 Dysphagia, oropharyngeal phase: Secondary | ICD-10-CM | POA: Diagnosis not present

## 2018-10-12 DIAGNOSIS — I2510 Atherosclerotic heart disease of native coronary artery without angina pectoris: Secondary | ICD-10-CM | POA: Diagnosis not present

## 2018-10-12 DIAGNOSIS — R262 Difficulty in walking, not elsewhere classified: Secondary | ICD-10-CM | POA: Diagnosis not present

## 2018-10-12 DIAGNOSIS — R1312 Dysphagia, oropharyngeal phase: Secondary | ICD-10-CM | POA: Diagnosis not present

## 2018-10-12 DIAGNOSIS — M6281 Muscle weakness (generalized): Secondary | ICD-10-CM | POA: Diagnosis not present

## 2018-10-13 DIAGNOSIS — R262 Difficulty in walking, not elsewhere classified: Secondary | ICD-10-CM | POA: Diagnosis not present

## 2018-10-13 DIAGNOSIS — M6281 Muscle weakness (generalized): Secondary | ICD-10-CM | POA: Diagnosis not present

## 2018-10-13 DIAGNOSIS — R1312 Dysphagia, oropharyngeal phase: Secondary | ICD-10-CM | POA: Diagnosis not present

## 2018-10-13 DIAGNOSIS — I2510 Atherosclerotic heart disease of native coronary artery without angina pectoris: Secondary | ICD-10-CM | POA: Diagnosis not present

## 2018-10-14 DIAGNOSIS — R262 Difficulty in walking, not elsewhere classified: Secondary | ICD-10-CM | POA: Diagnosis not present

## 2018-10-14 DIAGNOSIS — I2510 Atherosclerotic heart disease of native coronary artery without angina pectoris: Secondary | ICD-10-CM | POA: Diagnosis not present

## 2018-10-14 DIAGNOSIS — R1312 Dysphagia, oropharyngeal phase: Secondary | ICD-10-CM | POA: Diagnosis not present

## 2018-10-14 DIAGNOSIS — M6281 Muscle weakness (generalized): Secondary | ICD-10-CM | POA: Diagnosis not present

## 2018-10-15 DIAGNOSIS — R262 Difficulty in walking, not elsewhere classified: Secondary | ICD-10-CM | POA: Diagnosis not present

## 2018-10-15 DIAGNOSIS — M6281 Muscle weakness (generalized): Secondary | ICD-10-CM | POA: Diagnosis not present

## 2018-10-15 DIAGNOSIS — I2510 Atherosclerotic heart disease of native coronary artery without angina pectoris: Secondary | ICD-10-CM | POA: Diagnosis not present

## 2018-10-15 DIAGNOSIS — R1312 Dysphagia, oropharyngeal phase: Secondary | ICD-10-CM | POA: Diagnosis not present

## 2018-10-16 DIAGNOSIS — R262 Difficulty in walking, not elsewhere classified: Secondary | ICD-10-CM | POA: Diagnosis not present

## 2018-10-16 DIAGNOSIS — I2510 Atherosclerotic heart disease of native coronary artery without angina pectoris: Secondary | ICD-10-CM | POA: Diagnosis not present

## 2018-10-16 DIAGNOSIS — M6281 Muscle weakness (generalized): Secondary | ICD-10-CM | POA: Diagnosis not present

## 2018-10-16 DIAGNOSIS — R1312 Dysphagia, oropharyngeal phase: Secondary | ICD-10-CM | POA: Diagnosis not present

## 2018-10-19 DIAGNOSIS — R1312 Dysphagia, oropharyngeal phase: Secondary | ICD-10-CM | POA: Diagnosis not present

## 2018-10-19 DIAGNOSIS — R262 Difficulty in walking, not elsewhere classified: Secondary | ICD-10-CM | POA: Diagnosis not present

## 2018-10-19 DIAGNOSIS — I2510 Atherosclerotic heart disease of native coronary artery without angina pectoris: Secondary | ICD-10-CM | POA: Diagnosis not present

## 2018-10-19 DIAGNOSIS — M6281 Muscle weakness (generalized): Secondary | ICD-10-CM | POA: Diagnosis not present

## 2018-10-20 DIAGNOSIS — R1312 Dysphagia, oropharyngeal phase: Secondary | ICD-10-CM | POA: Diagnosis not present

## 2018-10-20 DIAGNOSIS — R262 Difficulty in walking, not elsewhere classified: Secondary | ICD-10-CM | POA: Diagnosis not present

## 2018-10-20 DIAGNOSIS — M6281 Muscle weakness (generalized): Secondary | ICD-10-CM | POA: Diagnosis not present

## 2018-10-20 DIAGNOSIS — I2510 Atherosclerotic heart disease of native coronary artery without angina pectoris: Secondary | ICD-10-CM | POA: Diagnosis not present

## 2018-10-21 DIAGNOSIS — M6281 Muscle weakness (generalized): Secondary | ICD-10-CM | POA: Diagnosis not present

## 2018-10-21 DIAGNOSIS — R1312 Dysphagia, oropharyngeal phase: Secondary | ICD-10-CM | POA: Diagnosis not present

## 2018-10-21 DIAGNOSIS — R262 Difficulty in walking, not elsewhere classified: Secondary | ICD-10-CM | POA: Diagnosis not present

## 2018-10-21 DIAGNOSIS — I2510 Atherosclerotic heart disease of native coronary artery without angina pectoris: Secondary | ICD-10-CM | POA: Diagnosis not present

## 2018-10-22 DIAGNOSIS — R262 Difficulty in walking, not elsewhere classified: Secondary | ICD-10-CM | POA: Diagnosis not present

## 2018-10-22 DIAGNOSIS — I2510 Atherosclerotic heart disease of native coronary artery without angina pectoris: Secondary | ICD-10-CM | POA: Diagnosis not present

## 2018-10-22 DIAGNOSIS — R1312 Dysphagia, oropharyngeal phase: Secondary | ICD-10-CM | POA: Diagnosis not present

## 2018-10-22 DIAGNOSIS — M6281 Muscle weakness (generalized): Secondary | ICD-10-CM | POA: Diagnosis not present

## 2018-10-23 ENCOUNTER — Ambulatory Visit: Payer: Medicare Other | Admitting: Podiatry

## 2018-10-23 DIAGNOSIS — M6281 Muscle weakness (generalized): Secondary | ICD-10-CM | POA: Diagnosis not present

## 2018-10-23 DIAGNOSIS — R262 Difficulty in walking, not elsewhere classified: Secondary | ICD-10-CM | POA: Diagnosis not present

## 2018-10-23 DIAGNOSIS — L89223 Pressure ulcer of left hip, stage 3: Secondary | ICD-10-CM | POA: Diagnosis not present

## 2018-10-23 DIAGNOSIS — I2510 Atherosclerotic heart disease of native coronary artery without angina pectoris: Secondary | ICD-10-CM | POA: Diagnosis not present

## 2018-10-23 DIAGNOSIS — R1312 Dysphagia, oropharyngeal phase: Secondary | ICD-10-CM | POA: Diagnosis not present

## 2018-10-25 DIAGNOSIS — R262 Difficulty in walking, not elsewhere classified: Secondary | ICD-10-CM | POA: Diagnosis not present

## 2018-10-25 DIAGNOSIS — I2510 Atherosclerotic heart disease of native coronary artery without angina pectoris: Secondary | ICD-10-CM | POA: Diagnosis not present

## 2018-10-25 DIAGNOSIS — R1312 Dysphagia, oropharyngeal phase: Secondary | ICD-10-CM | POA: Diagnosis not present

## 2018-10-25 DIAGNOSIS — M6281 Muscle weakness (generalized): Secondary | ICD-10-CM | POA: Diagnosis not present

## 2018-10-27 DIAGNOSIS — M6281 Muscle weakness (generalized): Secondary | ICD-10-CM | POA: Diagnosis not present

## 2018-10-27 DIAGNOSIS — R262 Difficulty in walking, not elsewhere classified: Secondary | ICD-10-CM | POA: Diagnosis not present

## 2018-10-27 DIAGNOSIS — I2510 Atherosclerotic heart disease of native coronary artery without angina pectoris: Secondary | ICD-10-CM | POA: Diagnosis not present

## 2018-10-27 DIAGNOSIS — R1312 Dysphagia, oropharyngeal phase: Secondary | ICD-10-CM | POA: Diagnosis not present

## 2018-10-28 DIAGNOSIS — I2510 Atherosclerotic heart disease of native coronary artery without angina pectoris: Secondary | ICD-10-CM | POA: Diagnosis not present

## 2018-10-28 DIAGNOSIS — M6281 Muscle weakness (generalized): Secondary | ICD-10-CM | POA: Diagnosis not present

## 2018-10-28 DIAGNOSIS — R262 Difficulty in walking, not elsewhere classified: Secondary | ICD-10-CM | POA: Diagnosis not present

## 2018-10-28 DIAGNOSIS — R1312 Dysphagia, oropharyngeal phase: Secondary | ICD-10-CM | POA: Diagnosis not present

## 2018-10-29 DIAGNOSIS — I2510 Atherosclerotic heart disease of native coronary artery without angina pectoris: Secondary | ICD-10-CM | POA: Diagnosis not present

## 2018-10-29 DIAGNOSIS — R1312 Dysphagia, oropharyngeal phase: Secondary | ICD-10-CM | POA: Diagnosis not present

## 2018-10-29 DIAGNOSIS — M6281 Muscle weakness (generalized): Secondary | ICD-10-CM | POA: Diagnosis not present

## 2018-10-29 DIAGNOSIS — R262 Difficulty in walking, not elsewhere classified: Secondary | ICD-10-CM | POA: Diagnosis not present

## 2018-10-31 DIAGNOSIS — R262 Difficulty in walking, not elsewhere classified: Secondary | ICD-10-CM | POA: Diagnosis not present

## 2018-10-31 DIAGNOSIS — I2510 Atherosclerotic heart disease of native coronary artery without angina pectoris: Secondary | ICD-10-CM | POA: Diagnosis not present

## 2018-10-31 DIAGNOSIS — M6281 Muscle weakness (generalized): Secondary | ICD-10-CM | POA: Diagnosis not present

## 2018-10-31 DIAGNOSIS — R1312 Dysphagia, oropharyngeal phase: Secondary | ICD-10-CM | POA: Diagnosis not present

## 2018-11-02 DIAGNOSIS — R262 Difficulty in walking, not elsewhere classified: Secondary | ICD-10-CM | POA: Diagnosis not present

## 2018-11-02 DIAGNOSIS — I2510 Atherosclerotic heart disease of native coronary artery without angina pectoris: Secondary | ICD-10-CM | POA: Diagnosis not present

## 2018-11-02 DIAGNOSIS — M6281 Muscle weakness (generalized): Secondary | ICD-10-CM | POA: Diagnosis not present

## 2018-11-02 DIAGNOSIS — R1312 Dysphagia, oropharyngeal phase: Secondary | ICD-10-CM | POA: Diagnosis not present

## 2018-11-02 DIAGNOSIS — I10 Essential (primary) hypertension: Secondary | ICD-10-CM | POA: Diagnosis not present

## 2018-11-03 DIAGNOSIS — R262 Difficulty in walking, not elsewhere classified: Secondary | ICD-10-CM | POA: Diagnosis not present

## 2018-11-03 DIAGNOSIS — I2510 Atherosclerotic heart disease of native coronary artery without angina pectoris: Secondary | ICD-10-CM | POA: Diagnosis not present

## 2018-11-03 DIAGNOSIS — R1312 Dysphagia, oropharyngeal phase: Secondary | ICD-10-CM | POA: Diagnosis not present

## 2018-11-04 DIAGNOSIS — R1312 Dysphagia, oropharyngeal phase: Secondary | ICD-10-CM | POA: Diagnosis not present

## 2018-11-04 DIAGNOSIS — I2510 Atherosclerotic heart disease of native coronary artery without angina pectoris: Secondary | ICD-10-CM | POA: Diagnosis not present

## 2018-11-04 DIAGNOSIS — R262 Difficulty in walking, not elsewhere classified: Secondary | ICD-10-CM | POA: Diagnosis not present

## 2018-12-09 DIAGNOSIS — I2510 Atherosclerotic heart disease of native coronary artery without angina pectoris: Secondary | ICD-10-CM | POA: Diagnosis not present

## 2018-12-09 DIAGNOSIS — M6281 Muscle weakness (generalized): Secondary | ICD-10-CM | POA: Diagnosis not present

## 2018-12-09 DIAGNOSIS — I10 Essential (primary) hypertension: Secondary | ICD-10-CM | POA: Diagnosis not present

## 2019-01-01 DIAGNOSIS — R1312 Dysphagia, oropharyngeal phase: Secondary | ICD-10-CM | POA: Diagnosis not present

## 2019-01-01 DIAGNOSIS — R262 Difficulty in walking, not elsewhere classified: Secondary | ICD-10-CM | POA: Diagnosis not present

## 2019-01-01 DIAGNOSIS — I2510 Atherosclerotic heart disease of native coronary artery without angina pectoris: Secondary | ICD-10-CM | POA: Diagnosis not present

## 2019-01-04 DIAGNOSIS — R262 Difficulty in walking, not elsewhere classified: Secondary | ICD-10-CM | POA: Diagnosis not present

## 2019-01-04 DIAGNOSIS — I2510 Atherosclerotic heart disease of native coronary artery without angina pectoris: Secondary | ICD-10-CM | POA: Diagnosis not present

## 2019-01-04 DIAGNOSIS — R1312 Dysphagia, oropharyngeal phase: Secondary | ICD-10-CM | POA: Diagnosis not present

## 2019-01-05 DIAGNOSIS — R1312 Dysphagia, oropharyngeal phase: Secondary | ICD-10-CM | POA: Diagnosis not present

## 2019-01-05 DIAGNOSIS — R262 Difficulty in walking, not elsewhere classified: Secondary | ICD-10-CM | POA: Diagnosis not present

## 2019-01-05 DIAGNOSIS — I2510 Atherosclerotic heart disease of native coronary artery without angina pectoris: Secondary | ICD-10-CM | POA: Diagnosis not present

## 2019-01-06 DIAGNOSIS — I10 Essential (primary) hypertension: Secondary | ICD-10-CM | POA: Diagnosis not present

## 2019-01-06 DIAGNOSIS — R1312 Dysphagia, oropharyngeal phase: Secondary | ICD-10-CM | POA: Diagnosis not present

## 2019-01-06 DIAGNOSIS — R262 Difficulty in walking, not elsewhere classified: Secondary | ICD-10-CM | POA: Diagnosis not present

## 2019-01-06 DIAGNOSIS — M6281 Muscle weakness (generalized): Secondary | ICD-10-CM | POA: Diagnosis not present

## 2019-01-06 DIAGNOSIS — I2510 Atherosclerotic heart disease of native coronary artery without angina pectoris: Secondary | ICD-10-CM | POA: Diagnosis not present

## 2019-01-07 DIAGNOSIS — R262 Difficulty in walking, not elsewhere classified: Secondary | ICD-10-CM | POA: Diagnosis not present

## 2019-01-07 DIAGNOSIS — I2510 Atherosclerotic heart disease of native coronary artery without angina pectoris: Secondary | ICD-10-CM | POA: Diagnosis not present

## 2019-01-07 DIAGNOSIS — R1312 Dysphagia, oropharyngeal phase: Secondary | ICD-10-CM | POA: Diagnosis not present

## 2019-01-08 DIAGNOSIS — R262 Difficulty in walking, not elsewhere classified: Secondary | ICD-10-CM | POA: Diagnosis not present

## 2019-01-08 DIAGNOSIS — I2510 Atherosclerotic heart disease of native coronary artery without angina pectoris: Secondary | ICD-10-CM | POA: Diagnosis not present

## 2019-01-08 DIAGNOSIS — R1312 Dysphagia, oropharyngeal phase: Secondary | ICD-10-CM | POA: Diagnosis not present

## 2019-01-11 DIAGNOSIS — R1312 Dysphagia, oropharyngeal phase: Secondary | ICD-10-CM | POA: Diagnosis not present

## 2019-01-11 DIAGNOSIS — R262 Difficulty in walking, not elsewhere classified: Secondary | ICD-10-CM | POA: Diagnosis not present

## 2019-01-11 DIAGNOSIS — I2510 Atherosclerotic heart disease of native coronary artery without angina pectoris: Secondary | ICD-10-CM | POA: Diagnosis not present

## 2019-01-12 DIAGNOSIS — R1312 Dysphagia, oropharyngeal phase: Secondary | ICD-10-CM | POA: Diagnosis not present

## 2019-01-12 DIAGNOSIS — R262 Difficulty in walking, not elsewhere classified: Secondary | ICD-10-CM | POA: Diagnosis not present

## 2019-01-12 DIAGNOSIS — I2510 Atherosclerotic heart disease of native coronary artery without angina pectoris: Secondary | ICD-10-CM | POA: Diagnosis not present

## 2019-01-13 DIAGNOSIS — I2510 Atherosclerotic heart disease of native coronary artery without angina pectoris: Secondary | ICD-10-CM | POA: Diagnosis not present

## 2019-01-13 DIAGNOSIS — R1312 Dysphagia, oropharyngeal phase: Secondary | ICD-10-CM | POA: Diagnosis not present

## 2019-01-13 DIAGNOSIS — R262 Difficulty in walking, not elsewhere classified: Secondary | ICD-10-CM | POA: Diagnosis not present

## 2019-01-14 DIAGNOSIS — I2510 Atherosclerotic heart disease of native coronary artery without angina pectoris: Secondary | ICD-10-CM | POA: Diagnosis not present

## 2019-01-14 DIAGNOSIS — R262 Difficulty in walking, not elsewhere classified: Secondary | ICD-10-CM | POA: Diagnosis not present

## 2019-01-14 DIAGNOSIS — R1312 Dysphagia, oropharyngeal phase: Secondary | ICD-10-CM | POA: Diagnosis not present

## 2019-01-15 DIAGNOSIS — I2510 Atherosclerotic heart disease of native coronary artery without angina pectoris: Secondary | ICD-10-CM | POA: Diagnosis not present

## 2019-01-15 DIAGNOSIS — R262 Difficulty in walking, not elsewhere classified: Secondary | ICD-10-CM | POA: Diagnosis not present

## 2019-01-15 DIAGNOSIS — R1312 Dysphagia, oropharyngeal phase: Secondary | ICD-10-CM | POA: Diagnosis not present

## 2019-01-18 DIAGNOSIS — I2510 Atherosclerotic heart disease of native coronary artery without angina pectoris: Secondary | ICD-10-CM | POA: Diagnosis not present

## 2019-01-18 DIAGNOSIS — R262 Difficulty in walking, not elsewhere classified: Secondary | ICD-10-CM | POA: Diagnosis not present

## 2019-01-18 DIAGNOSIS — R1312 Dysphagia, oropharyngeal phase: Secondary | ICD-10-CM | POA: Diagnosis not present

## 2019-01-19 DIAGNOSIS — I2510 Atherosclerotic heart disease of native coronary artery without angina pectoris: Secondary | ICD-10-CM | POA: Diagnosis not present

## 2019-01-19 DIAGNOSIS — R262 Difficulty in walking, not elsewhere classified: Secondary | ICD-10-CM | POA: Diagnosis not present

## 2019-01-19 DIAGNOSIS — R1312 Dysphagia, oropharyngeal phase: Secondary | ICD-10-CM | POA: Diagnosis not present

## 2019-01-20 DIAGNOSIS — R262 Difficulty in walking, not elsewhere classified: Secondary | ICD-10-CM | POA: Diagnosis not present

## 2019-01-20 DIAGNOSIS — R1312 Dysphagia, oropharyngeal phase: Secondary | ICD-10-CM | POA: Diagnosis not present

## 2019-01-20 DIAGNOSIS — I2510 Atherosclerotic heart disease of native coronary artery without angina pectoris: Secondary | ICD-10-CM | POA: Diagnosis not present

## 2019-01-21 DIAGNOSIS — R1312 Dysphagia, oropharyngeal phase: Secondary | ICD-10-CM | POA: Diagnosis not present

## 2019-01-21 DIAGNOSIS — R262 Difficulty in walking, not elsewhere classified: Secondary | ICD-10-CM | POA: Diagnosis not present

## 2019-01-21 DIAGNOSIS — I2510 Atherosclerotic heart disease of native coronary artery without angina pectoris: Secondary | ICD-10-CM | POA: Diagnosis not present

## 2019-01-22 DIAGNOSIS — R262 Difficulty in walking, not elsewhere classified: Secondary | ICD-10-CM | POA: Diagnosis not present

## 2019-01-22 DIAGNOSIS — R1312 Dysphagia, oropharyngeal phase: Secondary | ICD-10-CM | POA: Diagnosis not present

## 2019-01-22 DIAGNOSIS — I2510 Atherosclerotic heart disease of native coronary artery without angina pectoris: Secondary | ICD-10-CM | POA: Diagnosis not present

## 2019-01-25 DIAGNOSIS — R1312 Dysphagia, oropharyngeal phase: Secondary | ICD-10-CM | POA: Diagnosis not present

## 2019-01-25 DIAGNOSIS — R262 Difficulty in walking, not elsewhere classified: Secondary | ICD-10-CM | POA: Diagnosis not present

## 2019-01-25 DIAGNOSIS — I2510 Atherosclerotic heart disease of native coronary artery without angina pectoris: Secondary | ICD-10-CM | POA: Diagnosis not present

## 2019-01-26 DIAGNOSIS — R1312 Dysphagia, oropharyngeal phase: Secondary | ICD-10-CM | POA: Diagnosis not present

## 2019-01-26 DIAGNOSIS — R262 Difficulty in walking, not elsewhere classified: Secondary | ICD-10-CM | POA: Diagnosis not present

## 2019-01-26 DIAGNOSIS — I2510 Atherosclerotic heart disease of native coronary artery without angina pectoris: Secondary | ICD-10-CM | POA: Diagnosis not present

## 2019-01-27 DIAGNOSIS — R262 Difficulty in walking, not elsewhere classified: Secondary | ICD-10-CM | POA: Diagnosis not present

## 2019-01-27 DIAGNOSIS — R1312 Dysphagia, oropharyngeal phase: Secondary | ICD-10-CM | POA: Diagnosis not present

## 2019-01-27 DIAGNOSIS — I2510 Atherosclerotic heart disease of native coronary artery without angina pectoris: Secondary | ICD-10-CM | POA: Diagnosis not present

## 2019-01-28 DIAGNOSIS — I2510 Atherosclerotic heart disease of native coronary artery without angina pectoris: Secondary | ICD-10-CM | POA: Diagnosis not present

## 2019-01-28 DIAGNOSIS — R262 Difficulty in walking, not elsewhere classified: Secondary | ICD-10-CM | POA: Diagnosis not present

## 2019-01-28 DIAGNOSIS — R1312 Dysphagia, oropharyngeal phase: Secondary | ICD-10-CM | POA: Diagnosis not present

## 2019-01-29 DIAGNOSIS — R262 Difficulty in walking, not elsewhere classified: Secondary | ICD-10-CM | POA: Diagnosis not present

## 2019-01-29 DIAGNOSIS — R1312 Dysphagia, oropharyngeal phase: Secondary | ICD-10-CM | POA: Diagnosis not present

## 2019-01-29 DIAGNOSIS — I2510 Atherosclerotic heart disease of native coronary artery without angina pectoris: Secondary | ICD-10-CM | POA: Diagnosis not present

## 2019-02-01 DIAGNOSIS — R262 Difficulty in walking, not elsewhere classified: Secondary | ICD-10-CM | POA: Diagnosis not present

## 2019-02-01 DIAGNOSIS — R1312 Dysphagia, oropharyngeal phase: Secondary | ICD-10-CM | POA: Diagnosis not present

## 2019-02-01 DIAGNOSIS — I2510 Atherosclerotic heart disease of native coronary artery without angina pectoris: Secondary | ICD-10-CM | POA: Diagnosis not present

## 2019-02-02 DIAGNOSIS — R1312 Dysphagia, oropharyngeal phase: Secondary | ICD-10-CM | POA: Diagnosis not present

## 2019-02-02 DIAGNOSIS — R262 Difficulty in walking, not elsewhere classified: Secondary | ICD-10-CM | POA: Diagnosis not present

## 2019-02-02 DIAGNOSIS — I2510 Atherosclerotic heart disease of native coronary artery without angina pectoris: Secondary | ICD-10-CM | POA: Diagnosis not present

## 2019-02-03 DIAGNOSIS — I2510 Atherosclerotic heart disease of native coronary artery without angina pectoris: Secondary | ICD-10-CM | POA: Diagnosis not present

## 2019-02-03 DIAGNOSIS — R262 Difficulty in walking, not elsewhere classified: Secondary | ICD-10-CM | POA: Diagnosis not present

## 2019-02-03 DIAGNOSIS — M6281 Muscle weakness (generalized): Secondary | ICD-10-CM | POA: Diagnosis not present

## 2019-02-03 DIAGNOSIS — I10 Essential (primary) hypertension: Secondary | ICD-10-CM | POA: Diagnosis not present

## 2019-02-03 DIAGNOSIS — R1312 Dysphagia, oropharyngeal phase: Secondary | ICD-10-CM | POA: Diagnosis not present

## 2019-02-04 DIAGNOSIS — R1312 Dysphagia, oropharyngeal phase: Secondary | ICD-10-CM | POA: Diagnosis not present

## 2019-02-04 DIAGNOSIS — R262 Difficulty in walking, not elsewhere classified: Secondary | ICD-10-CM | POA: Diagnosis not present

## 2019-02-04 DIAGNOSIS — I2510 Atherosclerotic heart disease of native coronary artery without angina pectoris: Secondary | ICD-10-CM | POA: Diagnosis not present

## 2019-02-05 DIAGNOSIS — R262 Difficulty in walking, not elsewhere classified: Secondary | ICD-10-CM | POA: Diagnosis not present

## 2019-02-05 DIAGNOSIS — I2510 Atherosclerotic heart disease of native coronary artery without angina pectoris: Secondary | ICD-10-CM | POA: Diagnosis not present

## 2019-02-05 DIAGNOSIS — R1312 Dysphagia, oropharyngeal phase: Secondary | ICD-10-CM | POA: Diagnosis not present

## 2019-02-08 DIAGNOSIS — I2510 Atherosclerotic heart disease of native coronary artery without angina pectoris: Secondary | ICD-10-CM | POA: Diagnosis not present

## 2019-02-08 DIAGNOSIS — R1312 Dysphagia, oropharyngeal phase: Secondary | ICD-10-CM | POA: Diagnosis not present

## 2019-02-08 DIAGNOSIS — R262 Difficulty in walking, not elsewhere classified: Secondary | ICD-10-CM | POA: Diagnosis not present

## 2019-02-09 DIAGNOSIS — I2510 Atherosclerotic heart disease of native coronary artery without angina pectoris: Secondary | ICD-10-CM | POA: Diagnosis not present

## 2019-02-09 DIAGNOSIS — R262 Difficulty in walking, not elsewhere classified: Secondary | ICD-10-CM | POA: Diagnosis not present

## 2019-02-09 DIAGNOSIS — R1312 Dysphagia, oropharyngeal phase: Secondary | ICD-10-CM | POA: Diagnosis not present

## 2019-02-13 IMAGING — CT CT HIP*R* W/O CM
2 of 3 series · 17 of 46 positions shown, 19 images · non-contrast
Comparison: Plain film from earlier in the same day.

CLINICAL DATA: Recent fall with hip pain, initial encounter

EXAM:
CT OF THE RIGHT HIP WITHOUT CONTRAST
TECHNIQUE: Multidetector CT imaging of the right hip was performed according to
the standard protocol. Multiplanar CT image reconstructions were
also generated.

[Series 3: axial st · axial · 0.56mm/px · z∈[-688,-492]mm · 14 of 114 slices shown, 16 images]
[im 8/114  soft-tissue]
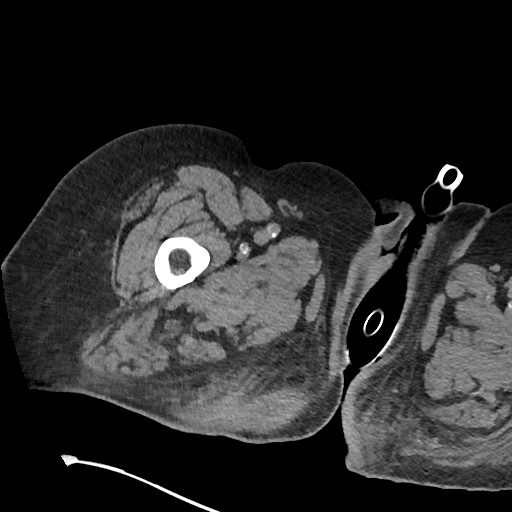
[im 8/114  bone]
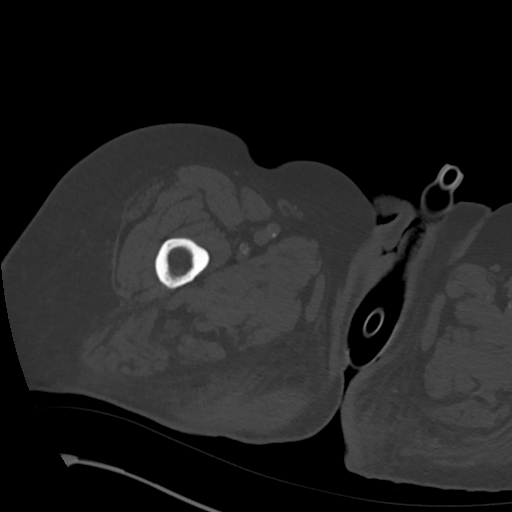
[im 15/114  soft-tissue]
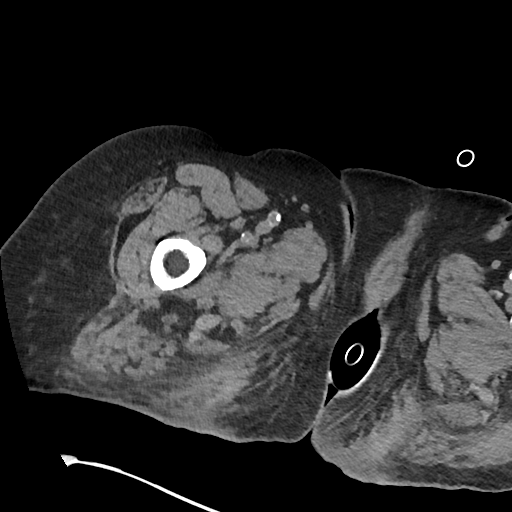
[im 22/114  soft-tissue]
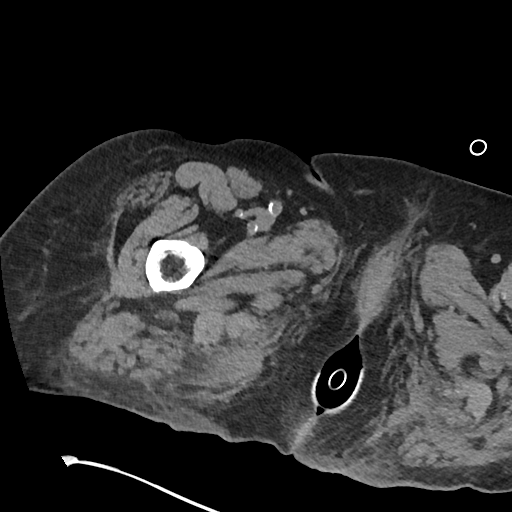
[im 30/114  soft-tissue]
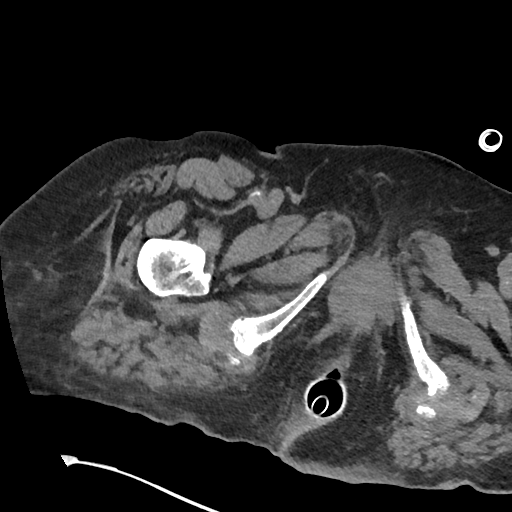
[im 37/114  soft-tissue]
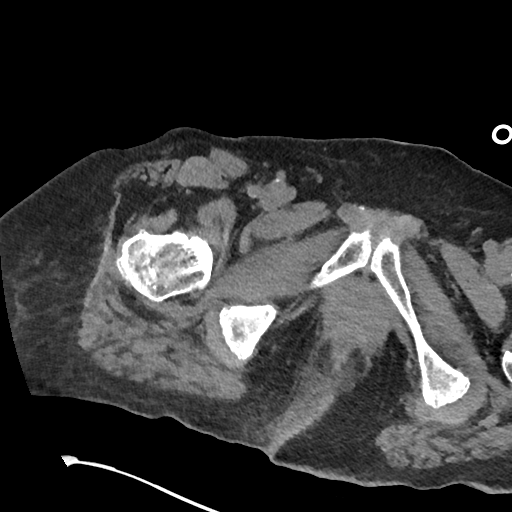
[im 44/114  soft-tissue]
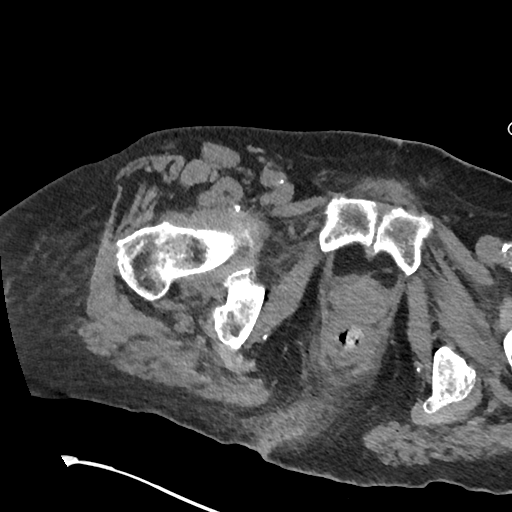
[im 52/114  soft-tissue]
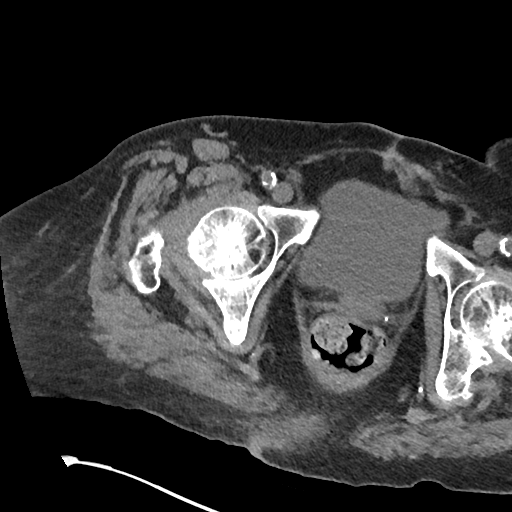
[im 62/114  soft-tissue]
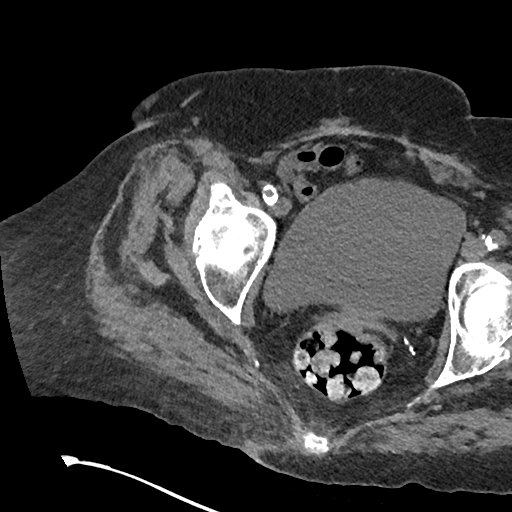
[im 70/114  soft-tissue]
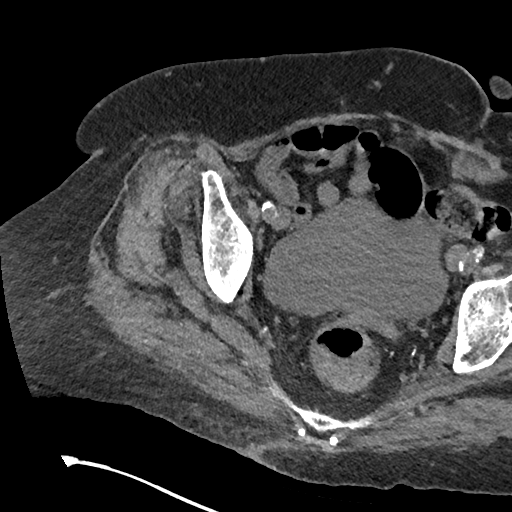
[im 70/114  bone]
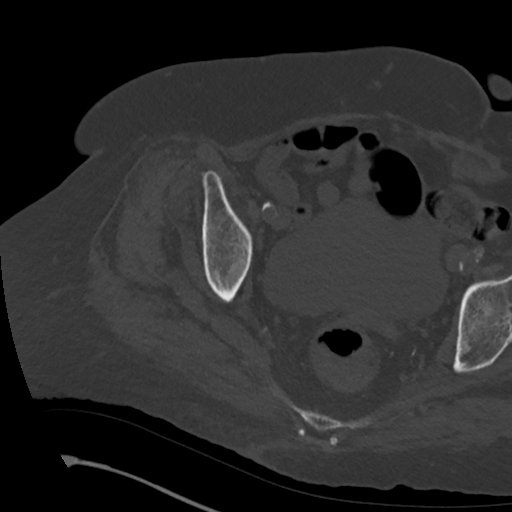
[im 77/114  soft-tissue]
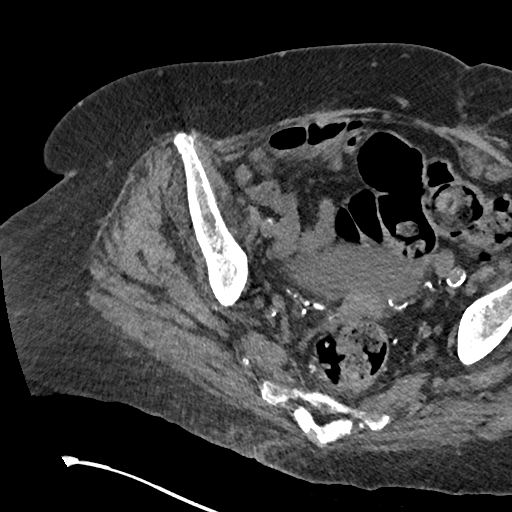
[im 84/114  soft-tissue]
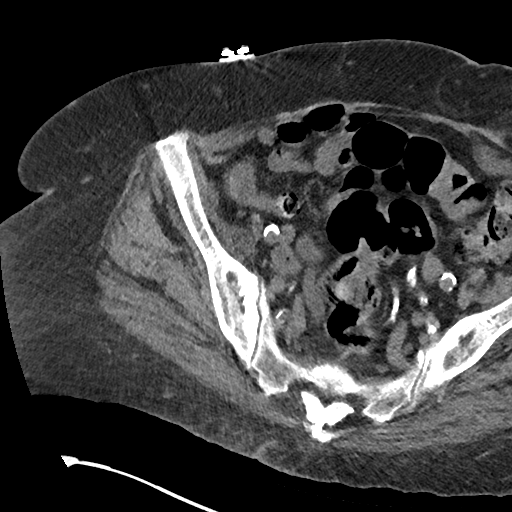
[im 92/114  soft-tissue]
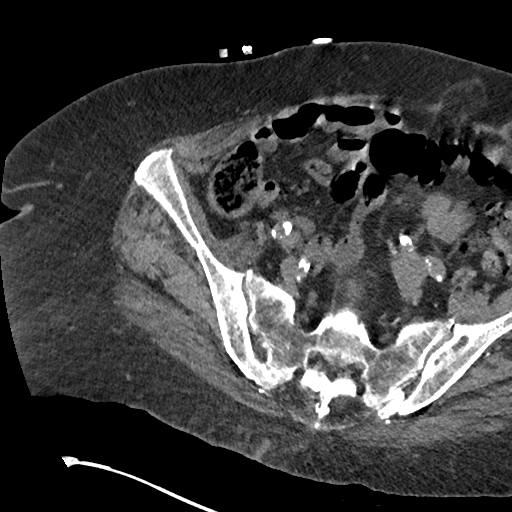
[im 99/114  soft-tissue]
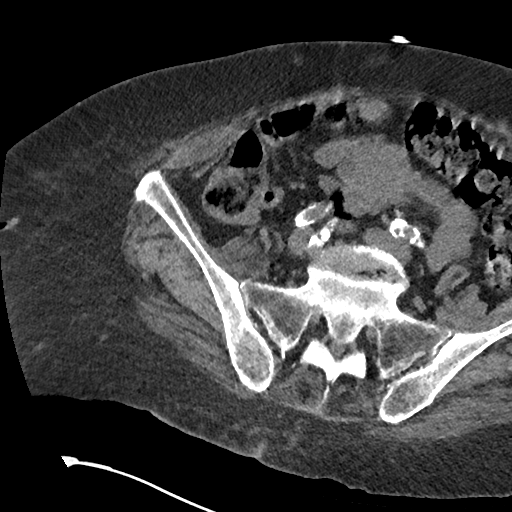
[im 106/114  soft-tissue]
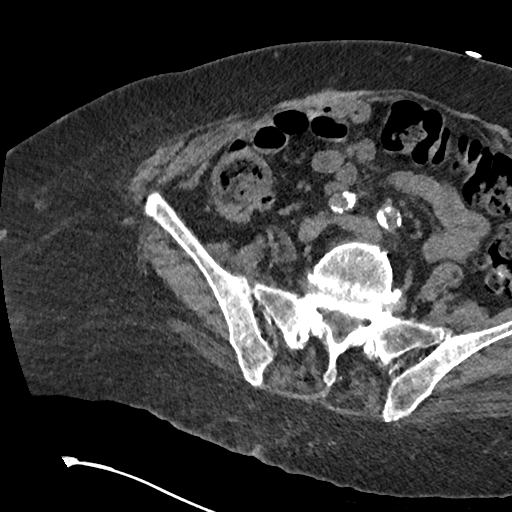

[Series 8: coronal st · coronal · 0.45mm/px · 3 of 106 slices shown]
[im 36/106  soft-tissue]
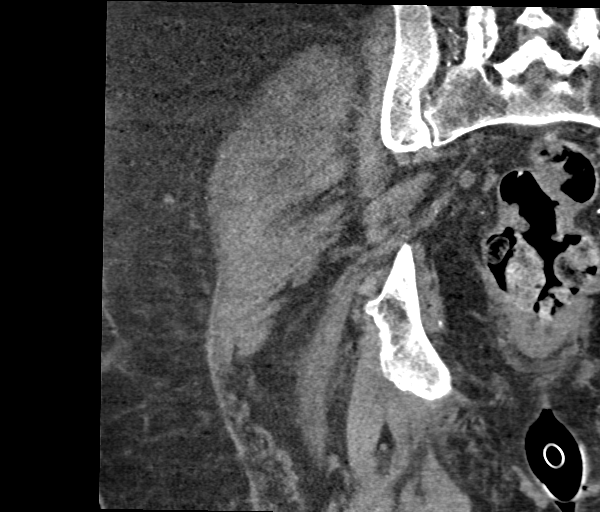
[im 47/106  soft-tissue]
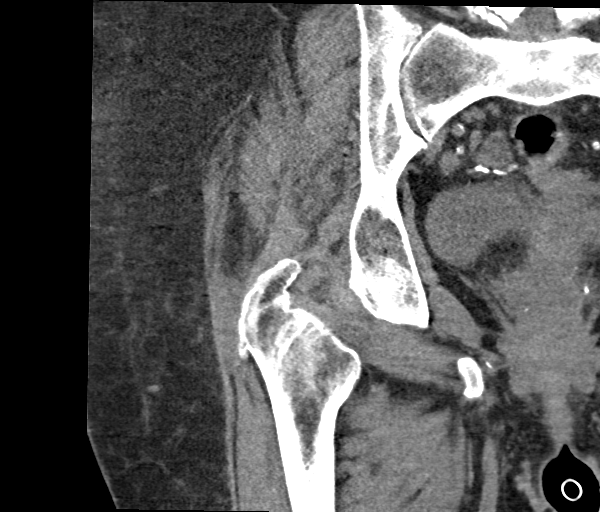
[im 59/106  soft-tissue]
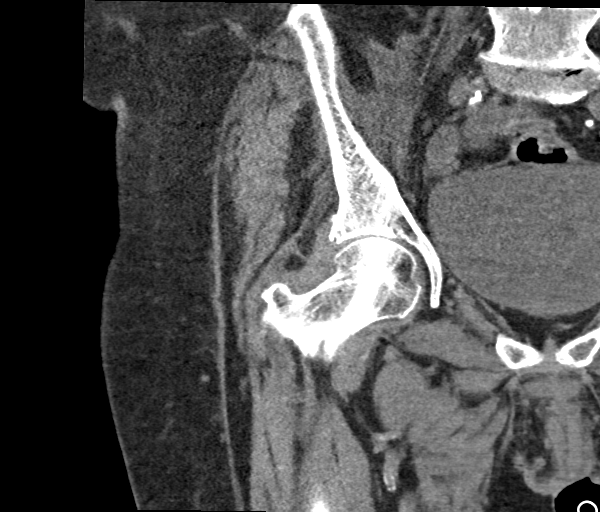

[17 of 46 positions shown; findings below may reference images not displayed]

FINDINGS: Bones/Joint/Cartilage

Bony structures are well visualized. No findings to suggest acute
fracture are seen. Degenerative changes of the sacroiliac joints and
lower lumbar spine are noted. No definitive joint effusion is seen.

Ligaments

Suboptimally assessed by CT.

Muscles and Tendons

Visualized muscular structures are within normal limits.

Soft tissues

Surrounding soft tissues demonstrate no focal hematoma. A fat
containing anterior abdominal wall hernia is noted to the right of
the umbilicus. The pelvic structures as visualized are within normal
limits.
IMPRESSION: No evidence of acute right femoral fracture. No other acute
abnormality is seen.

## 2019-02-13 IMAGING — CT CT HEAD W/O CM
4 series · 17 of 47 positions shown, 19 images · non-contrast
Comparison: 04/04/2007

CLINICAL DATA: Found on floor by family with generalized weakness

EXAM:
CT HEAD WITHOUT CONTRAST
TECHNIQUE: Contiguous axial images were obtained from the base of the skull
through the vertex without intravenous contrast.

[Series 2: head trauma wo · axial · 0.39mm/px · z∈[+1498,+1603]mm · 7 of 29 slices shown, 9 images]
[im 4/29  brain]
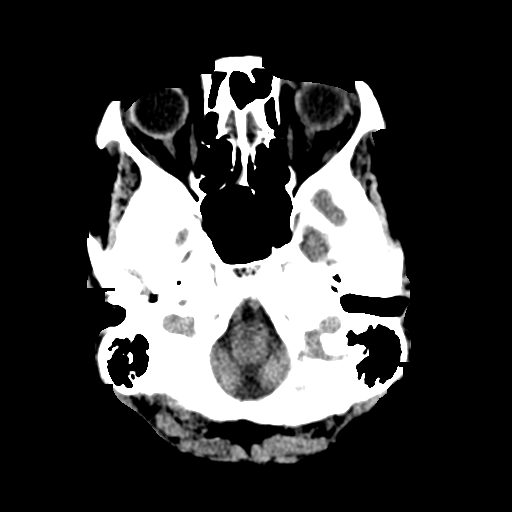
[im 4/29  bone]
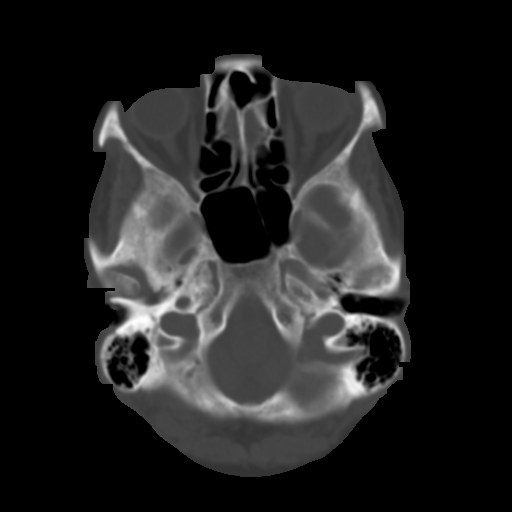
[im 8/29  brain]
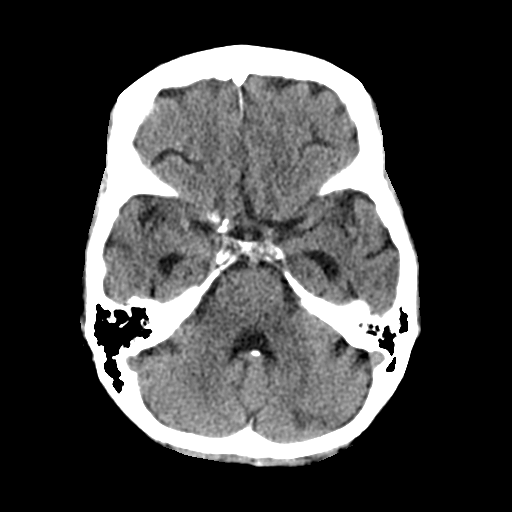
[im 11/29  brain]
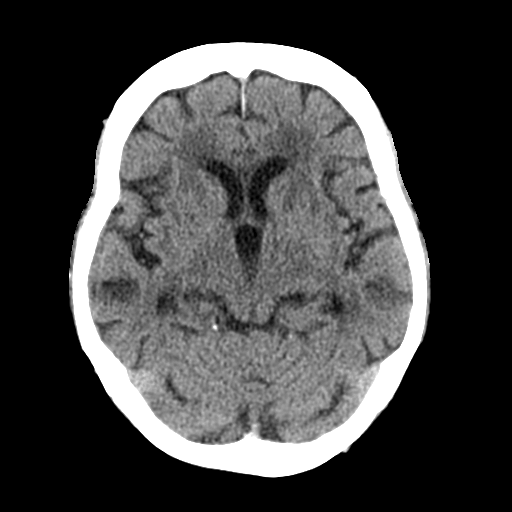
[im 15/29  brain]
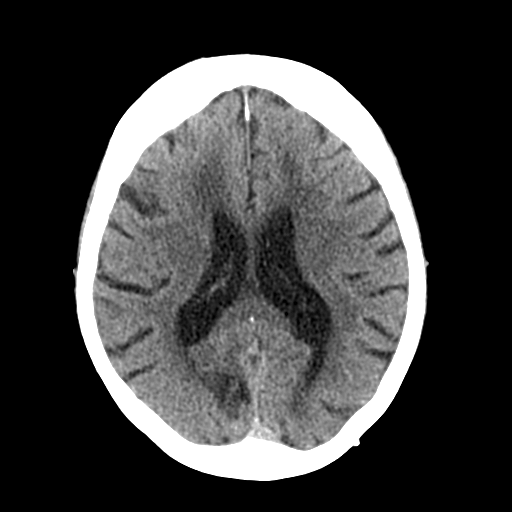
[im 18/29  brain]
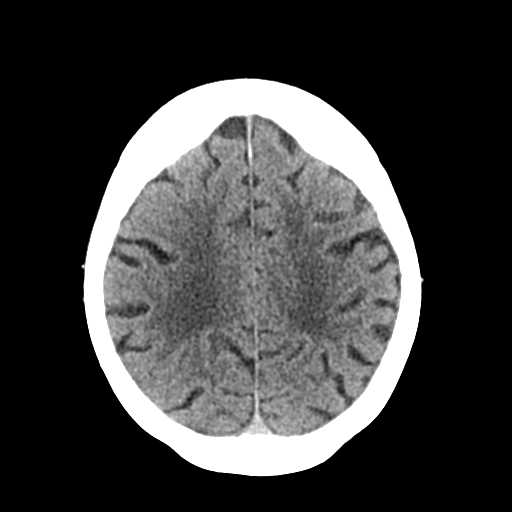
[im 18/29  bone]
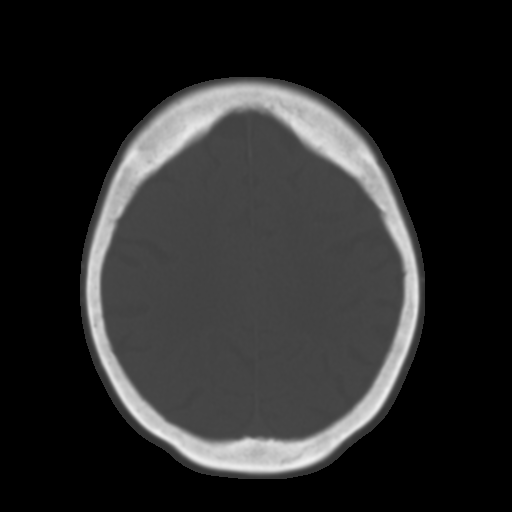
[im 22/29  brain]
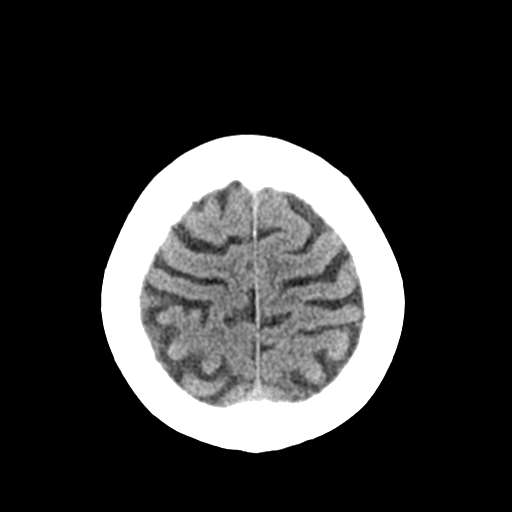
[im 25/29  brain]
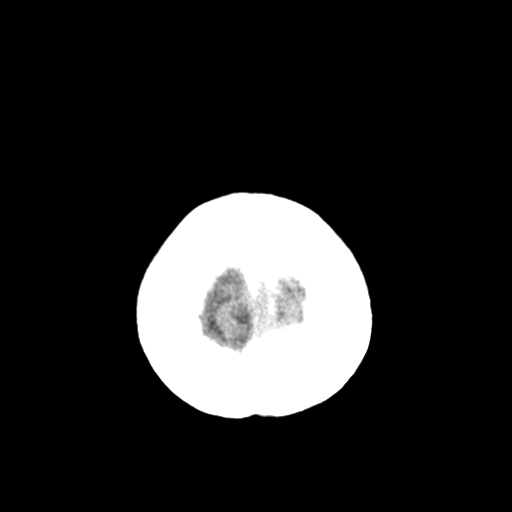

[Series 3: head bone · axial · 0.39mm/px · z∈[+1497,+1547]mm · 4 of 73 slices shown]
[im 8/73  bone]
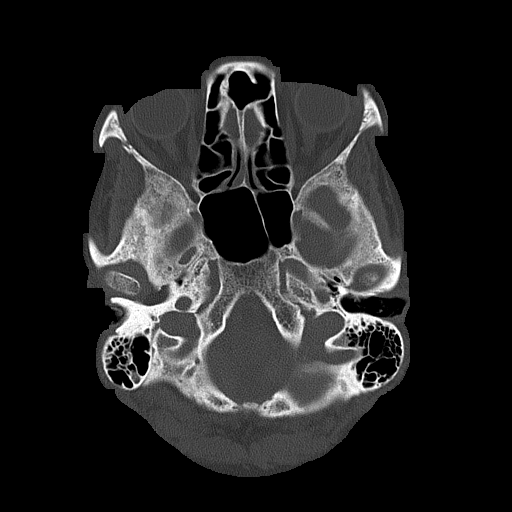
[im 15/73  bone]
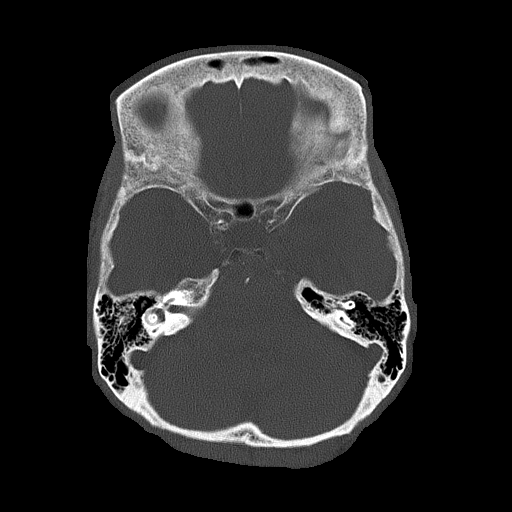
[im 22/73  bone]
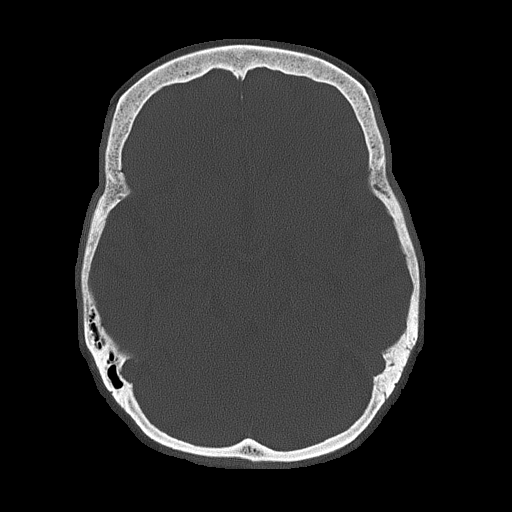
[im 33/73  bone]
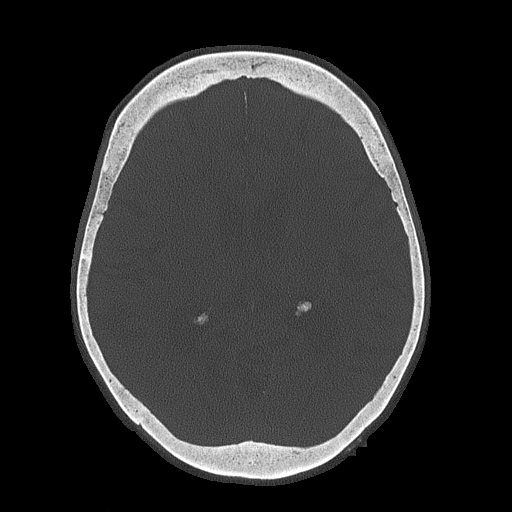

[Series 4: coronal soft tissue · coronal · 0.29mm/px · 3 of 64 slices shown]
[im 22/64  brain]
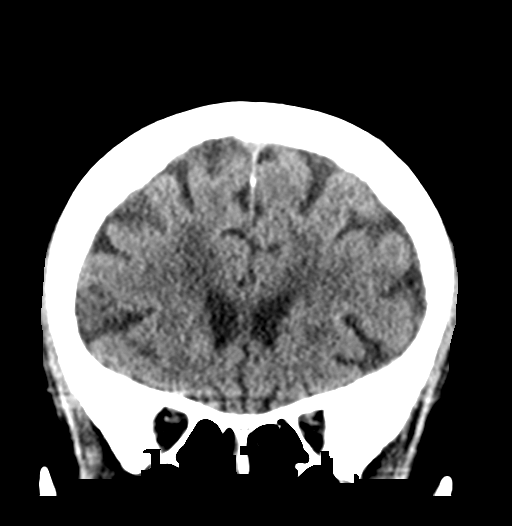
[im 29/64  brain]
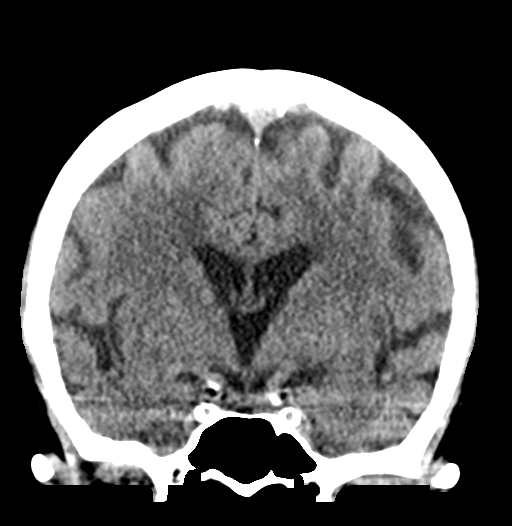
[im 36/64  brain]
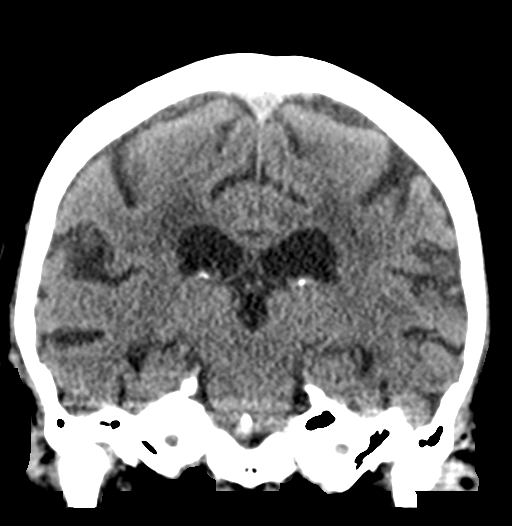

[Series 5: sagittal soft tissue · sagittal · 0.30mm/px · 3 of 56 slices shown]
[im 19/56  brain]
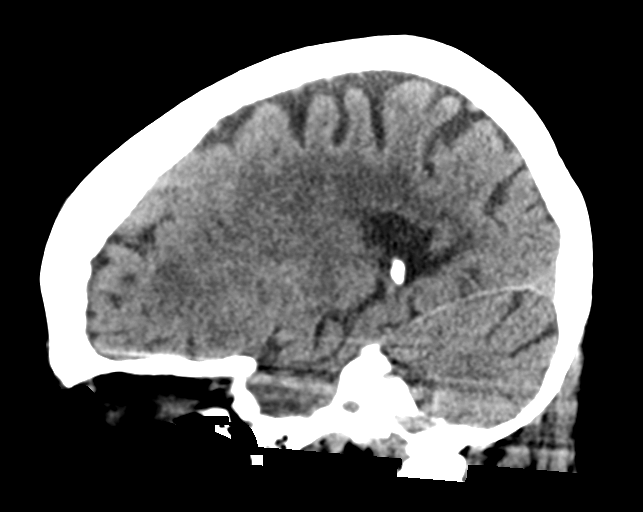
[im 28/56  brain]
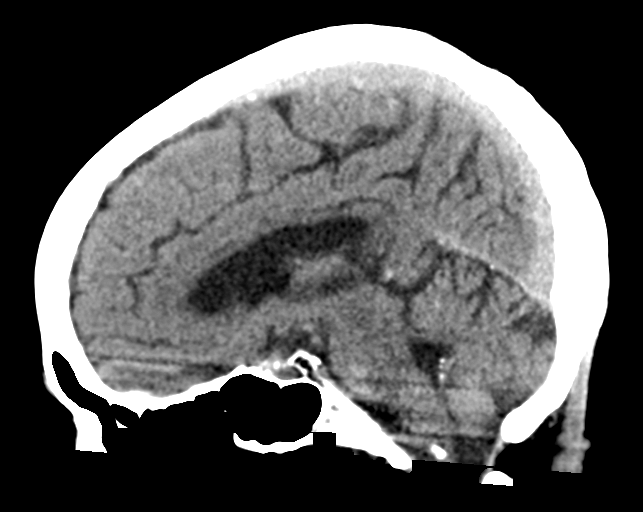
[im 37/56  brain]
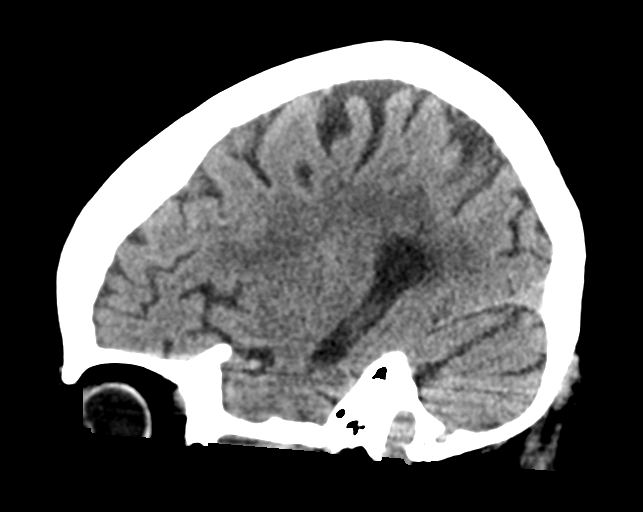

[17 of 47 positions shown; findings below may reference images not displayed]

FINDINGS: Brain: Mild atrophic changes and chronic white matter ischemic
changes are seen. No findings to suggest acute hemorrhage, acute
infarction or space-occupying mass lesion are noted.

Vascular: No hyperdense vessel or unexpected calcification.

Skull: Normal. Negative for fracture or focal lesion.

Sinuses/Orbits: No acute finding.

Other: None.
IMPRESSION: Chronic atrophic and ischemic changes without acute abnormality.

## 2019-02-18 DIAGNOSIS — I10 Essential (primary) hypertension: Secondary | ICD-10-CM | POA: Diagnosis not present

## 2019-02-18 DIAGNOSIS — I2510 Atherosclerotic heart disease of native coronary artery without angina pectoris: Secondary | ICD-10-CM | POA: Diagnosis not present

## 2019-02-18 DIAGNOSIS — K219 Gastro-esophageal reflux disease without esophagitis: Secondary | ICD-10-CM | POA: Diagnosis not present

## 2019-03-01 DIAGNOSIS — I10 Essential (primary) hypertension: Secondary | ICD-10-CM | POA: Diagnosis not present

## 2019-03-01 DIAGNOSIS — K219 Gastro-esophageal reflux disease without esophagitis: Secondary | ICD-10-CM | POA: Diagnosis not present

## 2019-03-01 DIAGNOSIS — I2510 Atherosclerotic heart disease of native coronary artery without angina pectoris: Secondary | ICD-10-CM | POA: Diagnosis not present

## 2019-03-24 DIAGNOSIS — I10 Essential (primary) hypertension: Secondary | ICD-10-CM | POA: Diagnosis not present

## 2019-03-24 DIAGNOSIS — E559 Vitamin D deficiency, unspecified: Secondary | ICD-10-CM | POA: Diagnosis not present

## 2019-03-24 DIAGNOSIS — K219 Gastro-esophageal reflux disease without esophagitis: Secondary | ICD-10-CM | POA: Diagnosis not present

## 2019-03-24 DIAGNOSIS — K5900 Constipation, unspecified: Secondary | ICD-10-CM | POA: Diagnosis not present

## 2019-04-19 DIAGNOSIS — K219 Gastro-esophageal reflux disease without esophagitis: Secondary | ICD-10-CM | POA: Diagnosis not present

## 2019-04-19 DIAGNOSIS — I10 Essential (primary) hypertension: Secondary | ICD-10-CM | POA: Diagnosis not present

## 2019-04-19 DIAGNOSIS — E559 Vitamin D deficiency, unspecified: Secondary | ICD-10-CM | POA: Diagnosis not present

## 2019-04-19 DIAGNOSIS — K5900 Constipation, unspecified: Secondary | ICD-10-CM | POA: Diagnosis not present

## 2019-04-26 DIAGNOSIS — Z7982 Long term (current) use of aspirin: Secondary | ICD-10-CM | POA: Diagnosis not present

## 2019-04-26 DIAGNOSIS — E875 Hyperkalemia: Secondary | ICD-10-CM | POA: Diagnosis not present

## 2019-04-26 DIAGNOSIS — Z79899 Other long term (current) drug therapy: Secondary | ICD-10-CM | POA: Diagnosis not present

## 2019-04-26 DIAGNOSIS — I959 Hypotension, unspecified: Secondary | ICD-10-CM | POA: Diagnosis not present

## 2019-04-26 DIAGNOSIS — J984 Other disorders of lung: Secondary | ICD-10-CM | POA: Diagnosis not present

## 2019-04-26 DIAGNOSIS — R262 Difficulty in walking, not elsewhere classified: Secondary | ICD-10-CM | POA: Diagnosis not present

## 2019-04-26 DIAGNOSIS — J8410 Pulmonary fibrosis, unspecified: Secondary | ICD-10-CM | POA: Diagnosis not present

## 2019-04-26 DIAGNOSIS — R1312 Dysphagia, oropharyngeal phase: Secondary | ICD-10-CM | POA: Diagnosis not present

## 2019-04-26 DIAGNOSIS — I700 Atherosclerosis of aorta: Secondary | ICD-10-CM | POA: Diagnosis not present

## 2019-04-26 DIAGNOSIS — I2510 Atherosclerotic heart disease of native coronary artery without angina pectoris: Secondary | ICD-10-CM | POA: Diagnosis not present

## 2019-04-26 DIAGNOSIS — J189 Pneumonia, unspecified organism: Secondary | ICD-10-CM | POA: Diagnosis not present

## 2019-04-26 DIAGNOSIS — R404 Transient alteration of awareness: Secondary | ICD-10-CM | POA: Diagnosis not present

## 2019-04-26 DIAGNOSIS — K8020 Calculus of gallbladder without cholecystitis without obstruction: Secondary | ICD-10-CM | POA: Diagnosis not present

## 2019-04-27 DIAGNOSIS — I2510 Atherosclerotic heart disease of native coronary artery without angina pectoris: Secondary | ICD-10-CM | POA: Diagnosis not present

## 2019-04-27 DIAGNOSIS — R1312 Dysphagia, oropharyngeal phase: Secondary | ICD-10-CM | POA: Diagnosis not present

## 2019-04-27 DIAGNOSIS — R262 Difficulty in walking, not elsewhere classified: Secondary | ICD-10-CM | POA: Diagnosis not present

## 2019-04-28 DIAGNOSIS — R262 Difficulty in walking, not elsewhere classified: Secondary | ICD-10-CM | POA: Diagnosis not present

## 2019-04-28 DIAGNOSIS — R1312 Dysphagia, oropharyngeal phase: Secondary | ICD-10-CM | POA: Diagnosis not present

## 2019-04-28 DIAGNOSIS — I2510 Atherosclerotic heart disease of native coronary artery without angina pectoris: Secondary | ICD-10-CM | POA: Diagnosis not present

## 2019-04-29 DIAGNOSIS — I2510 Atherosclerotic heart disease of native coronary artery without angina pectoris: Secondary | ICD-10-CM | POA: Diagnosis not present

## 2019-04-29 DIAGNOSIS — R262 Difficulty in walking, not elsewhere classified: Secondary | ICD-10-CM | POA: Diagnosis not present

## 2019-04-29 DIAGNOSIS — R1312 Dysphagia, oropharyngeal phase: Secondary | ICD-10-CM | POA: Diagnosis not present

## 2019-04-30 DIAGNOSIS — R262 Difficulty in walking, not elsewhere classified: Secondary | ICD-10-CM | POA: Diagnosis not present

## 2019-04-30 DIAGNOSIS — R1312 Dysphagia, oropharyngeal phase: Secondary | ICD-10-CM | POA: Diagnosis not present

## 2019-04-30 DIAGNOSIS — I2510 Atherosclerotic heart disease of native coronary artery without angina pectoris: Secondary | ICD-10-CM | POA: Diagnosis not present

## 2019-05-03 DIAGNOSIS — R262 Difficulty in walking, not elsewhere classified: Secondary | ICD-10-CM | POA: Diagnosis not present

## 2019-05-03 DIAGNOSIS — R1312 Dysphagia, oropharyngeal phase: Secondary | ICD-10-CM | POA: Diagnosis not present

## 2019-05-03 DIAGNOSIS — I2510 Atherosclerotic heart disease of native coronary artery without angina pectoris: Secondary | ICD-10-CM | POA: Diagnosis not present

## 2019-05-04 DIAGNOSIS — R262 Difficulty in walking, not elsewhere classified: Secondary | ICD-10-CM | POA: Diagnosis not present

## 2019-05-04 DIAGNOSIS — R1312 Dysphagia, oropharyngeal phase: Secondary | ICD-10-CM | POA: Diagnosis not present

## 2019-05-04 DIAGNOSIS — I2510 Atherosclerotic heart disease of native coronary artery without angina pectoris: Secondary | ICD-10-CM | POA: Diagnosis not present

## 2019-05-05 DIAGNOSIS — R1312 Dysphagia, oropharyngeal phase: Secondary | ICD-10-CM | POA: Diagnosis not present

## 2019-05-05 DIAGNOSIS — R262 Difficulty in walking, not elsewhere classified: Secondary | ICD-10-CM | POA: Diagnosis not present

## 2019-05-05 DIAGNOSIS — I2510 Atherosclerotic heart disease of native coronary artery without angina pectoris: Secondary | ICD-10-CM | POA: Diagnosis not present

## 2019-05-06 DIAGNOSIS — I2510 Atherosclerotic heart disease of native coronary artery without angina pectoris: Secondary | ICD-10-CM | POA: Diagnosis not present

## 2019-05-06 DIAGNOSIS — R262 Difficulty in walking, not elsewhere classified: Secondary | ICD-10-CM | POA: Diagnosis not present

## 2019-05-06 DIAGNOSIS — R1312 Dysphagia, oropharyngeal phase: Secondary | ICD-10-CM | POA: Diagnosis not present

## 2019-05-07 DIAGNOSIS — R1312 Dysphagia, oropharyngeal phase: Secondary | ICD-10-CM | POA: Diagnosis not present

## 2019-05-07 DIAGNOSIS — R262 Difficulty in walking, not elsewhere classified: Secondary | ICD-10-CM | POA: Diagnosis not present

## 2019-05-07 DIAGNOSIS — I2510 Atherosclerotic heart disease of native coronary artery without angina pectoris: Secondary | ICD-10-CM | POA: Diagnosis not present

## 2019-05-10 DIAGNOSIS — R1312 Dysphagia, oropharyngeal phase: Secondary | ICD-10-CM | POA: Diagnosis not present

## 2019-05-10 DIAGNOSIS — R262 Difficulty in walking, not elsewhere classified: Secondary | ICD-10-CM | POA: Diagnosis not present

## 2019-05-10 DIAGNOSIS — I2510 Atherosclerotic heart disease of native coronary artery without angina pectoris: Secondary | ICD-10-CM | POA: Diagnosis not present

## 2019-05-11 DIAGNOSIS — R1312 Dysphagia, oropharyngeal phase: Secondary | ICD-10-CM | POA: Diagnosis not present

## 2019-05-11 DIAGNOSIS — I2510 Atherosclerotic heart disease of native coronary artery without angina pectoris: Secondary | ICD-10-CM | POA: Diagnosis not present

## 2019-05-11 DIAGNOSIS — R262 Difficulty in walking, not elsewhere classified: Secondary | ICD-10-CM | POA: Diagnosis not present

## 2019-05-12 DIAGNOSIS — R262 Difficulty in walking, not elsewhere classified: Secondary | ICD-10-CM | POA: Diagnosis not present

## 2019-05-12 DIAGNOSIS — R1312 Dysphagia, oropharyngeal phase: Secondary | ICD-10-CM | POA: Diagnosis not present

## 2019-05-12 DIAGNOSIS — I2510 Atherosclerotic heart disease of native coronary artery without angina pectoris: Secondary | ICD-10-CM | POA: Diagnosis not present

## 2019-05-13 DIAGNOSIS — R1312 Dysphagia, oropharyngeal phase: Secondary | ICD-10-CM | POA: Diagnosis not present

## 2019-05-13 DIAGNOSIS — R262 Difficulty in walking, not elsewhere classified: Secondary | ICD-10-CM | POA: Diagnosis not present

## 2019-05-13 DIAGNOSIS — I2510 Atherosclerotic heart disease of native coronary artery without angina pectoris: Secondary | ICD-10-CM | POA: Diagnosis not present

## 2019-05-14 DIAGNOSIS — R1312 Dysphagia, oropharyngeal phase: Secondary | ICD-10-CM | POA: Diagnosis not present

## 2019-05-14 DIAGNOSIS — R262 Difficulty in walking, not elsewhere classified: Secondary | ICD-10-CM | POA: Diagnosis not present

## 2019-05-14 DIAGNOSIS — I2510 Atherosclerotic heart disease of native coronary artery without angina pectoris: Secondary | ICD-10-CM | POA: Diagnosis not present

## 2019-05-17 DIAGNOSIS — R1312 Dysphagia, oropharyngeal phase: Secondary | ICD-10-CM | POA: Diagnosis not present

## 2019-05-17 DIAGNOSIS — R262 Difficulty in walking, not elsewhere classified: Secondary | ICD-10-CM | POA: Diagnosis not present

## 2019-05-17 DIAGNOSIS — I2510 Atherosclerotic heart disease of native coronary artery without angina pectoris: Secondary | ICD-10-CM | POA: Diagnosis not present

## 2019-05-18 DIAGNOSIS — R1312 Dysphagia, oropharyngeal phase: Secondary | ICD-10-CM | POA: Diagnosis not present

## 2019-05-18 DIAGNOSIS — R262 Difficulty in walking, not elsewhere classified: Secondary | ICD-10-CM | POA: Diagnosis not present

## 2019-05-18 DIAGNOSIS — I2510 Atherosclerotic heart disease of native coronary artery without angina pectoris: Secondary | ICD-10-CM | POA: Diagnosis not present

## 2019-05-19 DIAGNOSIS — R1312 Dysphagia, oropharyngeal phase: Secondary | ICD-10-CM | POA: Diagnosis not present

## 2019-05-19 DIAGNOSIS — R262 Difficulty in walking, not elsewhere classified: Secondary | ICD-10-CM | POA: Diagnosis not present

## 2019-05-19 DIAGNOSIS — I2510 Atherosclerotic heart disease of native coronary artery without angina pectoris: Secondary | ICD-10-CM | POA: Diagnosis not present

## 2019-05-20 DIAGNOSIS — R1312 Dysphagia, oropharyngeal phase: Secondary | ICD-10-CM | POA: Diagnosis not present

## 2019-05-20 DIAGNOSIS — R262 Difficulty in walking, not elsewhere classified: Secondary | ICD-10-CM | POA: Diagnosis not present

## 2019-05-20 DIAGNOSIS — K219 Gastro-esophageal reflux disease without esophagitis: Secondary | ICD-10-CM | POA: Diagnosis not present

## 2019-05-20 DIAGNOSIS — I2510 Atherosclerotic heart disease of native coronary artery without angina pectoris: Secondary | ICD-10-CM | POA: Diagnosis not present

## 2019-05-20 DIAGNOSIS — I10 Essential (primary) hypertension: Secondary | ICD-10-CM | POA: Diagnosis not present

## 2019-05-21 DIAGNOSIS — I2510 Atherosclerotic heart disease of native coronary artery without angina pectoris: Secondary | ICD-10-CM | POA: Diagnosis not present

## 2019-05-21 DIAGNOSIS — R262 Difficulty in walking, not elsewhere classified: Secondary | ICD-10-CM | POA: Diagnosis not present

## 2019-05-21 DIAGNOSIS — R1312 Dysphagia, oropharyngeal phase: Secondary | ICD-10-CM | POA: Diagnosis not present

## 2019-05-24 DIAGNOSIS — R1312 Dysphagia, oropharyngeal phase: Secondary | ICD-10-CM | POA: Diagnosis not present

## 2019-05-24 DIAGNOSIS — R262 Difficulty in walking, not elsewhere classified: Secondary | ICD-10-CM | POA: Diagnosis not present

## 2019-05-24 DIAGNOSIS — I2510 Atherosclerotic heart disease of native coronary artery without angina pectoris: Secondary | ICD-10-CM | POA: Diagnosis not present

## 2019-05-25 DIAGNOSIS — R1312 Dysphagia, oropharyngeal phase: Secondary | ICD-10-CM | POA: Diagnosis not present

## 2019-05-25 DIAGNOSIS — I2510 Atherosclerotic heart disease of native coronary artery without angina pectoris: Secondary | ICD-10-CM | POA: Diagnosis not present

## 2019-05-25 DIAGNOSIS — R262 Difficulty in walking, not elsewhere classified: Secondary | ICD-10-CM | POA: Diagnosis not present

## 2019-05-26 DIAGNOSIS — I2510 Atherosclerotic heart disease of native coronary artery without angina pectoris: Secondary | ICD-10-CM | POA: Diagnosis not present

## 2019-05-26 DIAGNOSIS — R262 Difficulty in walking, not elsewhere classified: Secondary | ICD-10-CM | POA: Diagnosis not present

## 2019-05-26 DIAGNOSIS — R1312 Dysphagia, oropharyngeal phase: Secondary | ICD-10-CM | POA: Diagnosis not present

## 2019-05-27 DIAGNOSIS — I2510 Atherosclerotic heart disease of native coronary artery without angina pectoris: Secondary | ICD-10-CM | POA: Diagnosis not present

## 2019-05-27 DIAGNOSIS — R262 Difficulty in walking, not elsewhere classified: Secondary | ICD-10-CM | POA: Diagnosis not present

## 2019-05-27 DIAGNOSIS — R1312 Dysphagia, oropharyngeal phase: Secondary | ICD-10-CM | POA: Diagnosis not present

## 2019-05-28 DIAGNOSIS — I2510 Atherosclerotic heart disease of native coronary artery without angina pectoris: Secondary | ICD-10-CM | POA: Diagnosis not present

## 2019-05-28 DIAGNOSIS — R1312 Dysphagia, oropharyngeal phase: Secondary | ICD-10-CM | POA: Diagnosis not present

## 2019-05-28 DIAGNOSIS — R262 Difficulty in walking, not elsewhere classified: Secondary | ICD-10-CM | POA: Diagnosis not present

## 2019-06-01 DIAGNOSIS — R05 Cough: Secondary | ICD-10-CM | POA: Diagnosis not present

## 2019-06-02 DIAGNOSIS — D649 Anemia, unspecified: Secondary | ICD-10-CM | POA: Diagnosis not present

## 2019-06-02 DIAGNOSIS — R319 Hematuria, unspecified: Secondary | ICD-10-CM | POA: Diagnosis not present

## 2019-06-02 DIAGNOSIS — Z79899 Other long term (current) drug therapy: Secondary | ICD-10-CM | POA: Diagnosis not present

## 2019-06-02 DIAGNOSIS — I10 Essential (primary) hypertension: Secondary | ICD-10-CM | POA: Diagnosis not present

## 2019-06-02 DIAGNOSIS — N390 Urinary tract infection, site not specified: Secondary | ICD-10-CM | POA: Diagnosis not present

## 2019-06-17 DIAGNOSIS — E559 Vitamin D deficiency, unspecified: Secondary | ICD-10-CM | POA: Diagnosis not present

## 2019-06-17 DIAGNOSIS — I2510 Atherosclerotic heart disease of native coronary artery without angina pectoris: Secondary | ICD-10-CM | POA: Diagnosis not present

## 2019-06-17 DIAGNOSIS — I471 Supraventricular tachycardia: Secondary | ICD-10-CM | POA: Diagnosis not present

## 2019-06-17 DIAGNOSIS — I10 Essential (primary) hypertension: Secondary | ICD-10-CM | POA: Diagnosis not present

## 2019-06-23 DIAGNOSIS — D649 Anemia, unspecified: Secondary | ICD-10-CM | POA: Diagnosis not present

## 2019-06-23 DIAGNOSIS — I10 Essential (primary) hypertension: Secondary | ICD-10-CM | POA: Diagnosis not present

## 2019-06-30 DIAGNOSIS — U071 COVID-19: Secondary | ICD-10-CM | POA: Diagnosis not present

## 2019-07-01 DIAGNOSIS — U071 COVID-19: Secondary | ICD-10-CM | POA: Diagnosis not present

## 2019-07-05 DIAGNOSIS — I10 Essential (primary) hypertension: Secondary | ICD-10-CM | POA: Diagnosis not present

## 2019-07-05 DIAGNOSIS — D649 Anemia, unspecified: Secondary | ICD-10-CM | POA: Diagnosis not present

## 2019-07-06 DIAGNOSIS — I2510 Atherosclerotic heart disease of native coronary artery without angina pectoris: Secondary | ICD-10-CM | POA: Diagnosis not present

## 2019-07-08 DIAGNOSIS — I2510 Atherosclerotic heart disease of native coronary artery without angina pectoris: Secondary | ICD-10-CM | POA: Diagnosis not present

## 2019-07-13 DIAGNOSIS — I2510 Atherosclerotic heart disease of native coronary artery without angina pectoris: Secondary | ICD-10-CM | POA: Diagnosis not present

## 2019-07-14 DIAGNOSIS — I2510 Atherosclerotic heart disease of native coronary artery without angina pectoris: Secondary | ICD-10-CM | POA: Diagnosis not present

## 2019-07-16 DIAGNOSIS — I2510 Atherosclerotic heart disease of native coronary artery without angina pectoris: Secondary | ICD-10-CM | POA: Diagnosis not present

## 2019-07-18 DIAGNOSIS — I2510 Atherosclerotic heart disease of native coronary artery without angina pectoris: Secondary | ICD-10-CM | POA: Diagnosis not present

## 2019-07-19 DIAGNOSIS — I2510 Atherosclerotic heart disease of native coronary artery without angina pectoris: Secondary | ICD-10-CM | POA: Diagnosis not present

## 2019-07-20 DIAGNOSIS — I2510 Atherosclerotic heart disease of native coronary artery without angina pectoris: Secondary | ICD-10-CM | POA: Diagnosis not present

## 2019-07-28 DIAGNOSIS — I2510 Atherosclerotic heart disease of native coronary artery without angina pectoris: Secondary | ICD-10-CM | POA: Diagnosis not present

## 2019-07-29 DIAGNOSIS — I2510 Atherosclerotic heart disease of native coronary artery without angina pectoris: Secondary | ICD-10-CM | POA: Diagnosis not present

## 2019-07-30 DIAGNOSIS — I2510 Atherosclerotic heart disease of native coronary artery without angina pectoris: Secondary | ICD-10-CM | POA: Diagnosis not present

## 2019-08-02 DIAGNOSIS — I2510 Atherosclerotic heart disease of native coronary artery without angina pectoris: Secondary | ICD-10-CM | POA: Diagnosis not present

## 2019-08-03 DIAGNOSIS — I2510 Atherosclerotic heart disease of native coronary artery without angina pectoris: Secondary | ICD-10-CM | POA: Diagnosis not present

## 2019-08-04 DIAGNOSIS — I2510 Atherosclerotic heart disease of native coronary artery without angina pectoris: Secondary | ICD-10-CM | POA: Diagnosis not present

## 2021-03-04 DEATH — deceased
